# Patient Record
Sex: Male | Born: 1955
Health system: Southern US, Community
[De-identification: ages and names within clinical notes are randomized; demographics above are authoritative.]

## PROBLEM LIST (undated history)

## (undated) DIAGNOSIS — I10 Essential (primary) hypertension: Secondary | ICD-10-CM

## (undated) DIAGNOSIS — K2920 Alcoholic gastritis without bleeding: Secondary | ICD-10-CM

## (undated) DIAGNOSIS — R569 Unspecified convulsions: Secondary | ICD-10-CM

## (undated) DIAGNOSIS — Z8719 Personal history of other diseases of the digestive system: Secondary | ICD-10-CM

## (undated) DIAGNOSIS — J45909 Unspecified asthma, uncomplicated: Secondary | ICD-10-CM

## (undated) DIAGNOSIS — G8929 Other chronic pain: Secondary | ICD-10-CM

## (undated) DIAGNOSIS — F329 Major depressive disorder, single episode, unspecified: Secondary | ICD-10-CM

## (undated) DIAGNOSIS — Z8673 Personal history of transient ischemic attack (TIA), and cerebral infarction without residual deficits: Secondary | ICD-10-CM

## (undated) DIAGNOSIS — M545 Low back pain: Secondary | ICD-10-CM

## (undated) DIAGNOSIS — M1990 Unspecified osteoarthritis, unspecified site: Secondary | ICD-10-CM

## (undated) DIAGNOSIS — K089 Disorder of teeth and supporting structures, unspecified: Secondary | ICD-10-CM

## (undated) DIAGNOSIS — F419 Anxiety disorder, unspecified: Secondary | ICD-10-CM

## (undated) DIAGNOSIS — F1910 Other psychoactive substance abuse, uncomplicated: Secondary | ICD-10-CM

## (undated) DIAGNOSIS — I639 Cerebral infarction, unspecified: Secondary | ICD-10-CM

## (undated) DIAGNOSIS — K4090 Unilateral inguinal hernia, without obstruction or gangrene, not specified as recurrent: Secondary | ICD-10-CM

## (undated) DIAGNOSIS — L0390 Cellulitis, unspecified: Secondary | ICD-10-CM

## (undated) HISTORY — DX: Low back pain: M54.5

## (undated) HISTORY — DX: Major depressive disorder, single episode, unspecified: F32.9

## (undated) HISTORY — DX: Other chronic pain: G89.29

## (undated) HISTORY — DX: Anxiety disorder, unspecified: F41.9

## (undated) HISTORY — DX: Other psychoactive substance abuse, uncomplicated: F19.10

## (undated) HISTORY — DX: Disorder of teeth and supporting structures, unspecified: K08.9

## (undated) HISTORY — PX: EYE SURGERY: SHX253

## (undated) HISTORY — DX: Unspecified convulsions: R56.9

## (undated) HISTORY — PX: HERNIA REPAIR: SHX51

## (undated) HISTORY — DX: Personal history of other diseases of the digestive system: Z87.19

## (undated) HISTORY — DX: Essential (primary) hypertension: I10

## (undated) HISTORY — PX: HAND SURGERY: SHX662

## (undated) HISTORY — PX: ABDOMINAL SURGERY: SHX537

## (undated) HISTORY — DX: Personal history of transient ischemic attack (TIA), and cerebral infarction without residual deficits: Z86.73

## (undated) HISTORY — DX: Alcoholic gastritis without bleeding: K29.20

## (undated) HISTORY — PX: TONSILLECTOMY: SUR1361

---

## 1976-01-30 HISTORY — PX: FRACTURE SURGERY: SHX138

## 2006-01-29 DIAGNOSIS — K2920 Alcoholic gastritis without bleeding: Secondary | ICD-10-CM

## 2006-01-29 HISTORY — DX: Alcoholic gastritis without bleeding: K29.20

## 2006-02-15 ENCOUNTER — Inpatient Hospital Stay (HOSPITAL_COMMUNITY): Admission: EM | Admit: 2006-02-15 | Discharge: 2006-02-19 | Payer: Self-pay | Admitting: Emergency Medicine

## 2006-02-18 ENCOUNTER — Encounter (INDEPENDENT_AMBULATORY_CARE_PROVIDER_SITE_OTHER): Payer: Self-pay | Admitting: *Deleted

## 2006-02-22 ENCOUNTER — Ambulatory Visit: Payer: Self-pay | Admitting: Family Medicine

## 2006-02-25 ENCOUNTER — Ambulatory Visit: Payer: Self-pay | Admitting: Internal Medicine

## 2006-03-11 ENCOUNTER — Ambulatory Visit: Payer: Self-pay | Admitting: Family Medicine

## 2006-04-02 ENCOUNTER — Ambulatory Visit: Payer: Self-pay | Admitting: Family Medicine

## 2006-10-25 ENCOUNTER — Ambulatory Visit: Payer: Self-pay | Admitting: Family Medicine

## 2006-10-25 DIAGNOSIS — F419 Anxiety disorder, unspecified: Secondary | ICD-10-CM | POA: Insufficient documentation

## 2006-10-25 DIAGNOSIS — I10 Essential (primary) hypertension: Secondary | ICD-10-CM

## 2006-10-25 DIAGNOSIS — Z7289 Other problems related to lifestyle: Secondary | ICD-10-CM

## 2006-10-25 DIAGNOSIS — K209 Esophagitis, unspecified without bleeding: Secondary | ICD-10-CM | POA: Insufficient documentation

## 2006-10-25 HISTORY — DX: Essential (primary) hypertension: I10

## 2007-01-08 ENCOUNTER — Inpatient Hospital Stay (HOSPITAL_COMMUNITY): Admission: EM | Admit: 2007-01-08 | Discharge: 2007-01-11 | Payer: Self-pay | Admitting: Emergency Medicine

## 2007-01-08 ENCOUNTER — Ambulatory Visit: Payer: Self-pay | Admitting: Internal Medicine

## 2007-01-08 DIAGNOSIS — R569 Unspecified convulsions: Secondary | ICD-10-CM

## 2007-01-08 HISTORY — DX: Unspecified convulsions: R56.9

## 2007-01-14 ENCOUNTER — Ambulatory Visit: Payer: Self-pay | Admitting: Family Medicine

## 2007-01-14 DIAGNOSIS — F329 Major depressive disorder, single episode, unspecified: Secondary | ICD-10-CM

## 2007-01-14 DIAGNOSIS — F32A Depression, unspecified: Secondary | ICD-10-CM

## 2007-01-14 HISTORY — DX: Depression, unspecified: F32.A

## 2007-01-15 ENCOUNTER — Telehealth (INDEPENDENT_AMBULATORY_CARE_PROVIDER_SITE_OTHER): Payer: Self-pay | Admitting: *Deleted

## 2007-01-16 ENCOUNTER — Telehealth: Payer: Self-pay | Admitting: Family Medicine

## 2007-01-24 ENCOUNTER — Encounter: Payer: Self-pay | Admitting: Family Medicine

## 2007-01-28 LAB — CONVERTED CEMR LAB
BUN: 12 mg/dL (ref 6–23)
Calcium: 9.8 mg/dL (ref 8.4–10.5)
Chloride: 102 meq/L (ref 96–112)
Creatinine, Ser: 1.3 mg/dL (ref 0.4–1.5)
GFR calc Af Amer: 75 mL/min
HCT: 32.1 % — ABNORMAL LOW (ref 39.0–52.0)
Hemoglobin: 11.5 g/dL — ABNORMAL LOW (ref 13.0–17.0)
MCHC: 35.8 g/dL (ref 30.0–36.0)
Potassium: 4.5 meq/L (ref 3.5–5.1)
Sodium: 140 meq/L (ref 135–145)
WBC: 9.5 10*3/uL (ref 4.5–10.5)

## 2007-02-04 ENCOUNTER — Ambulatory Visit: Payer: Self-pay | Admitting: Family Medicine

## 2007-02-28 ENCOUNTER — Telehealth: Payer: Self-pay | Admitting: Family Medicine

## 2007-05-21 ENCOUNTER — Telehealth: Payer: Self-pay | Admitting: Family Medicine

## 2007-05-23 ENCOUNTER — Telehealth: Payer: Self-pay | Admitting: Family Medicine

## 2007-06-18 ENCOUNTER — Telehealth: Payer: Self-pay | Admitting: Family Medicine

## 2007-07-11 ENCOUNTER — Ambulatory Visit: Payer: Self-pay | Admitting: Family Medicine

## 2007-07-11 DIAGNOSIS — R569 Unspecified convulsions: Secondary | ICD-10-CM

## 2007-10-17 ENCOUNTER — Telehealth: Payer: Self-pay | Admitting: Family Medicine

## 2008-01-21 ENCOUNTER — Telehealth: Payer: Self-pay | Admitting: Family Medicine

## 2008-02-03 ENCOUNTER — Telehealth: Payer: Self-pay | Admitting: Family Medicine

## 2008-04-19 ENCOUNTER — Telehealth: Payer: Self-pay | Admitting: Family Medicine

## 2008-05-24 ENCOUNTER — Ambulatory Visit: Payer: Self-pay | Admitting: Family Medicine

## 2008-06-21 ENCOUNTER — Telehealth: Payer: Self-pay | Admitting: Family Medicine

## 2008-08-18 ENCOUNTER — Telehealth: Payer: Self-pay | Admitting: Family Medicine

## 2008-11-10 ENCOUNTER — Encounter (INDEPENDENT_AMBULATORY_CARE_PROVIDER_SITE_OTHER): Payer: Self-pay | Admitting: General Surgery

## 2008-11-10 ENCOUNTER — Inpatient Hospital Stay (HOSPITAL_COMMUNITY): Admission: EM | Admit: 2008-11-10 | Discharge: 2008-11-16 | Payer: Self-pay | Admitting: Emergency Medicine

## 2008-12-27 ENCOUNTER — Telehealth: Payer: Self-pay | Admitting: Family Medicine

## 2009-05-03 ENCOUNTER — Telehealth: Payer: Self-pay | Admitting: Family Medicine

## 2009-09-20 ENCOUNTER — Encounter: Payer: Self-pay | Admitting: Family Medicine

## 2009-10-04 ENCOUNTER — Ambulatory Visit: Payer: Self-pay | Admitting: Family Medicine

## 2010-02-28 NOTE — Progress Notes (Signed)
Summary: refill omeprazole  Phone Note From Pharmacy   Caller: [facility]  [address]  [phone]* Call For: refill  Summary of Call: omeprazole 40 mg  bid Initial call taken by: Duard Brady LPN,  May 03, 2009 9:03 AM  Follow-up for Phone Call        call in #60 with 2 rf Follow-up by: Nelwyn Salisbury MD,  May 03, 2009 12:58 PM    New/Updated Medications: OMEPRAZOLE 40 MG CPDR (OMEPRAZOLE) bid Prescriptions: OMEPRAZOLE 40 MG CPDR (OMEPRAZOLE) bid  #60 x 5   Entered by:   Duard Brady LPN   Authorized by:   Nelwyn Salisbury MD   Signed by:   Duard Brady LPN on 19/14/7829   Method used:   Electronically to        [facility]  [phone]* (retail)       [address]       [place], [place]  [address]       Ph: [phone] or [phone]       Fax: [phone]   RxID:   [id number]  added to med list and efilled. KIK

## 2010-02-28 NOTE — Letter (Signed)
Summary: Medical Review for [facility]  Medical Review for DMV   Imported By: Maryln Gottron 10/11/2009 13:11:14  _____________________________________________________________________  External Attachment:    Type:   Image     Comment:   External Document

## 2010-02-28 NOTE — Assessment & Plan Note (Signed)
Summary: fup meds//ccm   Vital Signs:  Patient profile:   55 year old male Weight:      157 pounds BMI:     23.27 O2 Sat:      95 % Temp:     99 degrees F BP sitting:   170 / 118  (left arm) Cuff size:   large  Vitals Entered By: Pura Spice, RN (October 04, 2009 9:06 AM) CC: ran out meds for 1 month under stress wants papers completed Is Patient Diabetic? No    History of Present Illness: Here for med refills and to fill out a DMV form. We have not seen him for over a year and a half, primarily because he has had no health insurance. he has a new job, but he needs to work 6 months before his insurance is effective. He needs DMV forms filled out due to his hx of alcohol abuse and one withdrawal seizure.  Of course his BP is up since he has been out of meds for several months.   Preventive Screening-Counseling & Management  Alcohol-Tobacco     Smoking Status: current     Packs/Day: 1.0     Year Started: 1972  Allergies (verified): No Known Drug Allergies  Past History:  Past Medical History: Reviewed history from 07/11/2007 and no changes required. Hypertension Anxiety esophagitis alcoholism single alcohol withdrawal seizure on 01-08-07  Social History: Packs/Day:  1.0  Review of Systems  The patient denies anorexia, fever, weight loss, weight gain, vision loss, decreased hearing, hoarseness, chest pain, syncope, dyspnea on exertion, peripheral edema, prolonged cough, headaches, hemoptysis, abdominal pain, melena, hematochezia, severe indigestion/heartburn, hematuria, incontinence, genital sores, muscle weakness, suspicious skin lesions, transient blindness, difficulty walking, depression, unusual weight change, abnormal bleeding, enlarged lymph nodes, angioedema, breast masses, and testicular masses.    Physical Exam  General:  Well-developed,well-nourished,in no acute distress; alert,appropriate and cooperative throughout examination Neck:  No deformities,  masses, or tenderness noted. Lungs:  Normal respiratory effort, chest expands symmetrically. Lungs are clear to auscultation, no crackles or wheezes. Heart:  Normal rate and regular rhythm. S1 and S2 normal without gallop, murmur, click, rub or other extra sounds. Extremities:  no edema    Impression & Recommendations:  Problem # 1:  SEIZURES, HX OF (ICD-V12.49)  Problem # 2:  ABUSE, ALCOHOL, UNSPECIFIED (ICD-305.00)  Problem # 3:  HYPERTENSION (ICD-401.9)  His updated medication list for this problem includes:    Metoprolol Tartrate 100 Mg Tabs (Metoprolol tartrate) .Marland Kitchen... 1 by mouth two times a day    Hydrochlorothiazide 25 Mg Tabs (Hydrochlorothiazide) .Marland Kitchen... Take 1 tab by mouth every morning    Amlodipine Besylate 5 Mg Tabs (Amlodipine besylate) .Marland Kitchen... 1 by mouth every day    Lisinopril 20 Mg Tabs (Lisinopril) .Marland Kitchen... 2  tablet by mouth daily  Complete Medication List: 1)  Metoprolol Tartrate 100 Mg Tabs (Metoprolol tartrate) .Marland Kitchen.. 1 by mouth two times a day 2)  Super B Complex Tabs (B complex-c) .Marland Kitchen.. 1 by mouth once daily 3)  Vitamin B-1 100 Mg Tabs (Thiamine hcl) .Marland Kitchen.. 1 by mouth once daily 4)  Prozac 20 Mg Caps (Fluoxetine hcl) .... Once daily 5)  Lorazepam 0.5 Mg Tabs (Lorazepam) .Marland Kitchen.. 1 by mouth two times a day 6)  Hydrochlorothiazide 25 Mg Tabs (Hydrochlorothiazide) .... Take 1 tab by mouth every morning 7)  Amlodipine Besylate 5 Mg Tabs (Amlodipine besylate) .Marland Kitchen.. 1 by mouth every day 8)  Lisinopril 20 Mg Tabs (Lisinopril) .... 2  tablet by mouth daily 9)  Omeprazole 40 Mg Cpdr (Omeprazole) .... Two times a day  Patient Instructions: 1)  I filled out his DMV forms and refilled his meds. He will set up a cpx soon.  Prescriptions: LORAZEPAM 0.5 MG  TABS (LORAZEPAM) 1 by mouth two times a day  #60 x 5   Entered and Authorized by:   Nelwyn Salisbury MD   Signed by:   Nelwyn Salisbury MD on 10/04/2009   Method used:   Print then Give to Patient   RxID:   [id number] OMEPRAZOLE 40  MG CPDR (OMEPRAZOLE) two times a day  #60 x 11   Entered and Authorized by:   Nelwyn Salisbury MD   Signed by:   Nelwyn Salisbury MD on 10/04/2009   Method used:   Electronically to        [facility]  [phone]* (retail)       [address]       [place], [place]  [address]       Ph: [phone] or [phone]       Fax: [phone]   RxID:   [id number] LISINOPRIL 20 MG TABS (LISINOPRIL) 2  tablet by mouth daily  #60 x 11   Entered and Authorized by:   Nelwyn Salisbury MD   Signed by:   Nelwyn Salisbury MD on 10/04/2009   Method used:   Electronically to        [facility]  [phone]* (retail)       [address]       [place], [place]  [address]       Ph: [phone] or [phone]       Fax: [phone]   RxID:   [id number] AMLODIPINE BESYLATE 5 MG  TABS (AMLODIPINE BESYLATE) 1 by mouth every day  #30 x 11   Entered and Authorized by:   Nelwyn Salisbury MD   Signed by:   Nelwyn Salisbury MD on 10/04/2009   Method used:   Electronically to        [facility]  [phone]* (retail)       [address]       [place], [place]  [address]       Ph: [phone] or [phone]       Fax: [phone]   RxID:   [id number] HYDROCHLOROTHIAZIDE 25 MG  TABS (HYDROCHLOROTHIAZIDE) Take 1 tab by mouth every morning  #30 x 11   Entered and Authorized by:   Nelwyn Salisbury MD   Signed by:   Nelwyn Salisbury MD on 10/04/2009   Method used:   Electronically to        [facility]  [phone]* (retail)       [address]       [place], [place]  [address]       Ph: [phone] or [phone]       Fax: [phone]   RxID:   [id number] PROZAC 20 MG  CAPS (FLUOXETINE HCL) once daily  #30 x 11   Entered and Authorized by:   Nelwyn Salisbury MD   Signed by:   Nelwyn Salisbury MD on 10/04/2009   Method used:   Electronically to  [facility]  [address]  [phone]*  (retail)       [address]       [place], [place]  [address]       Ph: [phone] or [phone]       Fax: [phone]   RxID:   [id number] METOPROLOL TARTRATE 100 MG  TABS (METOPROLOL TARTRATE) 1 by mouth two times a day  #60 x 11   Entered and Authorized by:   Nelwyn Salisbury MD   Signed by:   Nelwyn Salisbury MD on 10/04/2009   Method used:   Electronically to        [facility]  [phone]* (retail)       [address]       [place], [place]  [address]       Ph: [phone] or [phone]       Fax: [phone]   RxID:   [id number]

## 2010-04-11 ENCOUNTER — Other Ambulatory Visit: Payer: Self-pay | Admitting: Family Medicine

## 2010-04-26 ENCOUNTER — Other Ambulatory Visit: Payer: Self-pay | Admitting: Family Medicine

## 2010-04-27 NOTE — Telephone Encounter (Signed)
NEEDS REFILLS  LORAZEPAM

## 2010-04-27 NOTE — Telephone Encounter (Signed)
Called rx in for lorazepam

## 2010-04-27 NOTE — Telephone Encounter (Signed)
Call in #60 with 5 rf 

## 2010-05-04 LAB — COMPREHENSIVE METABOLIC PANEL
ALT: 16 U/L (ref 0–53)
AST: 43 U/L — ABNORMAL HIGH (ref 0–37)
Albumin: 2.1 g/dL — ABNORMAL LOW (ref 3.5–5.2)
Albumin: 3.2 g/dL — ABNORMAL LOW (ref 3.5–5.2)
Alkaline Phosphatase: 44 U/L (ref 39–117)
Alkaline Phosphatase: 67 U/L (ref 39–117)
CO2: 28 mEq/L (ref 19–32)
CO2: 28 mEq/L (ref 19–32)
Calcium: 7.2 mg/dL — ABNORMAL LOW (ref 8.4–10.5)
Chloride: 93 mEq/L — ABNORMAL LOW (ref 96–112)
GFR calc Af Amer: 53 mL/min — ABNORMAL LOW (ref >60)
GFR calc non Af Amer: 44 mL/min — ABNORMAL LOW (ref >60)
GFR calc non Af Amer: >60 mL/min (ref >60)
Glucose, Bld: 189 mg/dL — ABNORMAL HIGH (ref 70–99)
Potassium: 3.3 mEq/L — ABNORMAL LOW (ref 3.5–5.1)
Potassium: 3.8 mEq/L (ref 3.5–5.1)
Sodium: 133 mEq/L — ABNORMAL LOW (ref 135–145)
Total Bilirubin: 0.6 mg/dL (ref 0.3–1.2)

## 2010-05-04 LAB — CBC
HCT: 43.6 % (ref 39.0–52.0)
HCT: 51.9 % (ref 39.0–52.0)
Hemoglobin: 13.4 g/dL (ref 13.0–17.0)
Hemoglobin: 15.4 g/dL (ref 13.0–17.0)
Hemoglobin: 18.3 g/dL — ABNORMAL HIGH (ref 13.0–17.0)
MCHC: 35.1 g/dL (ref 30.0–36.0)
MCHC: 35.4 g/dL (ref 30.0–36.0)
MCV: 103.7 fL — ABNORMAL HIGH (ref 78.0–100.0)
MCV: 104.6 fL — ABNORMAL HIGH (ref 78.0–100.0)
MCV: 105 fL — ABNORMAL HIGH (ref 78.0–100.0)
Platelets: 246 10*3/uL (ref 150–400)
Platelets: 249 10*3/uL (ref 150–400)
Platelets: 303 10*3/uL (ref 150–400)
Platelets: 387 10*3/uL (ref 150–400)
RBC: 3.66 MIL/uL — ABNORMAL LOW (ref 4.22–5.81)
RBC: 4.16 MIL/uL — ABNORMAL LOW (ref 4.22–5.81)
RDW: 13.5 % (ref 11.5–15.5)
RDW: 14.1 % (ref 11.5–15.5)
WBC: 14.6 10*3/uL — ABNORMAL HIGH (ref 4.0–10.5)
WBC: 5.3 10*3/uL (ref 4.0–10.5)

## 2010-05-04 LAB — POCT I-STAT 3, ART BLOOD GAS (G3+)
Bicarbonate: 27.6 mEq/L — ABNORMAL HIGH (ref 20.0–24.0)
Patient temperature: 98.6
pCO2 arterial: 45.9 mmHg — ABNORMAL HIGH (ref 35.0–45.0)
pO2, Arterial: 83 mmHg (ref 80.0–100.0)

## 2010-05-04 LAB — DIFFERENTIAL
Neutro Abs: 13 10*3/uL — ABNORMAL HIGH (ref 1.7–7.7)
Neutrophils Relative %: 89 % — ABNORMAL HIGH (ref 43–77)

## 2010-05-04 LAB — PROTIME-INR
INR: 1.18 (ref 0.00–1.49)
Prothrombin Time: 14.9 seconds (ref 11.6–15.2)

## 2010-05-04 LAB — BASIC METABOLIC PANEL
BUN: 9 mg/dL (ref 6–23)
CO2: 25 mEq/L (ref 19–32)
CO2: 28 mEq/L (ref 19–32)
Chloride: 92 mEq/L — ABNORMAL LOW (ref 96–112)
Chloride: 94 mEq/L — ABNORMAL LOW (ref 96–112)
Creatinine, Ser: 0.86 mg/dL (ref 0.4–1.5)
Creatinine, Ser: 2.06 mg/dL — ABNORMAL HIGH (ref 0.4–1.5)
GFR calc Af Amer: 41 mL/min — ABNORMAL LOW (ref >60)
Glucose, Bld: 113 mg/dL — ABNORMAL HIGH (ref 70–99)
Sodium: 130 mEq/L — ABNORMAL LOW (ref 135–145)

## 2010-05-04 LAB — CREATININE, SERUM: Creatinine, Ser: 1.49 mg/dL (ref 0.4–1.5)

## 2010-06-13 NOTE — H&P (Signed)
NAMEEDMUND, Jared Reyes               ACCOUNT NO.:  [id number]   MEDICAL RECORD NO.:  [id number]          PATIENT TYPE:  EMS   LOCATION:  ED                           FACILITY:  [facility]   PHYSICIAN:  Hollice Espy, Jared ReyesDATE OF BIRTH:  February 24, 1955   DATE OF ADMISSION:  01/07/2007  DATE OF DISCHARGE:                              HISTORY & PHYSICAL   CHIEF COMPLAINT:  Confusion.   HISTORY OF PRESENT ILLNESS:  The patient is a 55 year old white male  with past medical history of hypertension and found to be heavy alcohol  use.  Initially the story is quite convoluted and according to the ER  attending, initially the patient was thought to have fallen or to have  had a brief syncopal episode at home.  But then according to the family  the story changed when they said that they found him outside.  It was  unclear how he got from inside to outside.  He was brought into the  emergency room for further evaluation.  In the emergency room, the  patient's blood pressure on admission was 96/61 with a heart rate of  115.  He was also noted to have a temperature of 101.1.  Labs were  ordered on the patient and he was found to have a normal white count,  but noted to have an MCV of 101, potassium 2.5, BUN 4, creatinine 1.63,  and elevated transaminases of AST 171, ALT 57, and alkaline phosphatase  226, and total bilirubin of 2.7.  His CT of his head was unremarkable,  but then during the ER workup, the patient had a sudden onset seizure.  It was after discussion with the family that they found that the patient  had a previous history of quite heavy alcohol abuse, but he says he has  not been drinking for a while, but he had one beer a few days ago.  It  was suspected that the patient was down playing his alcohol use as of  late and likely had an alcohol withdrawal seizure.  He was given doses  of Ativan of which he is currently sedated now and unable to give me any  kind of review of systems or  history which is obtained from the family  and from the ER attending.  The patient apparently appears to be calm.   PAST MEDICAL HISTORY:  Alcohol abuse and hypertension, as well as  anxiety.   MEDICATIONS:  1. Xanax 0.5 mg p.o. b.i.d.  2. Hydrochlorothiazide 25 mg daily.  3. Metoprolol 100 mg p.o. daily.  4. Vytorin 10/40 mg p.o. daily.   ALLERGIES:  No known drug allergies.   SOCIAL HISTORY:  He does smoke heavily.  He drinks heavy alcohol, both  beer and liquor.  No reported drug use.   FAMILY HISTORY:  Noncontributory.   PHYSICAL EXAMINATION:  VITAL SIGNS:  Temperature 97.8, heart rate 115,  blood pressure 96/61 now up to 116/52, respirations 20, O2 saturations  97% on room air.  He has a TMAX of 101.1.  GENERAL:  He is currently sleeping from sedation.  HEENT:  Normocephalic and atraumatic.  His mucous membranes are dry.  He  has no carotid bruits.  HEART:  Regular rate and rhythm, S1 and S2.  2/6 systolic ejection  murmur.  LUNGS:  Decreased breath sounds throughout.  ABDOMEN:  Soft, nontender, and nondistended.  Decreased bowel sounds.  EXTREMITIES:  No cyanosis, clubbing, or edema.   LABORATORY DATA:  White count 7.9, H&H 10.8 and 29, MCV 101, platelet  count 224.  Sodium 133, potassium 2.5, chloride 94, bicarb 24, BUN 4,  creatinine 1.6, glucose 132.  Urine is unremarkable.  LFT's are notable  for albumin 3.3, AST 171, ALT 57, alkaline phosphatase 226, total  bilirubin 2.7, direct bilirubin 1.2.   ASSESSMENT:  1. Alcohol withdrawal.  We will place on IV Ativan protocol.  2. Alcohol abuse.  Provide counseling.  3. Fever with possible signs of infection, although this may be      secondary to alcohol hepatitis or withdrawal.  We will continue to      monitor.  4. Hypokalemia.  We will replace.  5. Tobacco abuse.      Hollice Espy, M.D.  Electronically Signed     SKK/MEDQ  D:  01/08/2007  T:  01/08/2007  Job:  [id number]   cc:   Jeannett Senior A. Clent Ridges, MD  [address] [place]  [place] [address]

## 2010-06-13 NOTE — Discharge Summary (Signed)
NAMECORNELIOUS, Jared Reyes               ACCOUNT NO.:  [id number]   MEDICAL RECORD NO.:  [id number]          PATIENT TYPE:  INP   LOCATION:  1503                         FACILITY:  [facility]   PHYSICIAN:  Georgina Quint. Plotnikov, MDDATE OF BIRTH:  29-Apr-1955   DATE OF ADMISSION:  01/08/2007  DATE OF DISCHARGE:  01/11/2007                               DISCHARGE SUMMARY   DISCHARGE DIAGNOSES:  1. Alcohol withdrawal.  2. Alcohol withdrawal seizure.  3. Alcohol withdrawal agitation and confusion.  4. Fever, resolved.  5. Anemia likely related to alcohol use.  6. Tobacco abuse.  7. Hypertension.  8. History of dyslipidemia.   HISTORY:  The patient is 55 year old male who was admitted to the  hospital on January 07, 2007 by Dr. Rito Ehrlich, with confusion, agitation  and fever.  For the details, please address the history and physical.  During the course of hospitalization, the patient was treated for  agitation/alcohol withdrawal.  He also had agitation seizure.  He was  found to be anemic.  His liver tests were elevated.  The fever has  resolved, without antibiotics.   EXAMINATION:  VITAL SIGNS:  On the day of discharge, she is feeling  well.  Blood pressure 143/90, heart rate 96, respirations 20,  temperature 97.6, sats 99% on room air, potassium 3.5, sodium 136,  calcium 6.5, hemoglobin 9.4, MCV 103.2.  GENERAL:  He is in no acute distress, alert, oriented, cooperative.  HEENT:  Pupils reactive. HEENT with moist mucosa.  LUNGS:  Clear.  HEART:  Regular.  ABDOMEN:  Soft, nontender.  LOWER EXTREMITIES: Without edema.  Slight tremor in extended  extremities.  SKIN:  Clear.   LABS:  Potassium 3.5, sodium 136, calcium 6.5, hemoglobin 9.4, MCV  103.2.  Lab work in the past previous days revealed that his potassium  was 2.5 on admission, elevated alk phos at 188 and 226, later 163.  AST  171 - 65, ALT 57 - 38, magnesium 1.6.  His ferritin was 47 and 58,  folate 397, vitamin B12 685, iron 139,  cholesterol 161.  Urinalysis  normal.  Original RBC count was 10. Original hemoglobin was 7.8.  His  MRI of the brain showed a lot of artifact, no evidence of acute  thrombotic infarct.  Chest x-ray revealed no acute disease.  EKG  revealed sinus tachycardia with PACs.   FOLLOWUP PLANS:  1. Dr. Clent Ridges next week with CBC and a BMET.  2. He will join alcoholic anonymous.  3. He is advised not to smoke.   DISCHARGE MEDICATIONS:  1. Lorazepam 0.5 mg twice a day until he sees Dr. Clent Ridges, then as      directed by him.  2. Ranitidine 150 mg twice a day.  3. Thiamine 100 mg once daily.  4. Resume other home meds.  5. Vitamin B complex one daily.      Georgina Quint. Plotnikov, MD  Electronically Signed     AVP/MEDQ  D:  01/11/2007  T:  01/12/2007  Job:  [id number]

## 2010-06-16 NOTE — Assessment & Plan Note (Signed)
[facility]                            [facility] OFFICE NOTE   NAME:Jared Reyes                         MRN:          [id number]  DATE:02/22/2006                            DOB:          [birth year]    This is a 55 year old gentleman here to establish with our practice, and  who is for followup hypertension, upper GI bleeding, and alcohol  withdrawal.  He had not seen a regular physician in quite a while, but  then presented to [facility] emergency room on January 18th with  evidence of acute alcoholic withdrawal, of a hypertension crisis, of  elevated liver enzymes, and of anemia secondary to an acute GI bleed.  Workup included an upper endoscopy done on January 21st by Dr. Lina Sar.  This demonstrated duodenitis, duodenal ulcers, and gastritis  felt secondary to excessive alcohol use.  He was transfused 3 units of  blood which normalized his anemia.  He was treated for acute alcohol  withdrawal with benzodiazepines.  He was also hydrated and his elevated  blood pressures were treated.  Apparently, he did fairly well in the  hospital.  He was discharged on January 22nd on the medications below.  In general today he feels a lot better than he did, although he is still  somewhat weak.  He gets a bit shaky.  He is proud to say he has used no  alcohol whatsoever since he was admitted to the hospital, and says he is  determined to stay alcohol free for the rest of his life.  He does plan  to become involved with Alcoholics Anonymous, and says he is going to a  meeting in the next several days.  He was given Ativan for withdrawal  symptoms.  He says it works fairly well, but it seems to run out and he  feels shaky after several hours.  His stomach feels fine with no nausea  and no pain.  He has not checked his blood pressure since he got out of  the hospital.   Other past medical history includes surgery to repair an inguinal  hernia, surgery to a  hand for a broken bone, a tonsillectomy, and  treatment for depression.   ALLERGIES:  None.   CURRENT MEDICATIONS:  1. Norvasc 10 mg per day.  2. Metoprolol tartrate 25 mg once a day.  3. Lorazepam 1 mg 3 times.  4. Lisinopril 3 10 mg b.i.d.  5. Protonix 40 mg once a day.  6. Sucralfate 1 g t.i.d.   HABITS:  A long history of excessive alcohol abuse, but he has had  nothing to drink now for 1 week.  He continues to smoke cigarettes, but  has decreased his daily consumption from 2 packs a day to 1 pack a day.  He denies using illicit drugs.   SOCIAL HISTORY:  He is divorced.  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   FAMILY HISTORY:  Remarkable for hypertension and diabetes.   OBJECTIVE:  Height 5 foot 9 inches.  Weight 162.  BP 172/110.  Pulse 72  and regular.  GENERAL:  He appears to be doing well.  He is quite thin.  Affect is  bright and he is appropriate.  NECK:  Supple without lymphadenopathy or masses.  LUNGS:  Show soft diffuse rhonchi and diffuse wheezes.  CARDIAC:  Rate and rhythm are regular without gallops, murmurs, rubs.  Distal pulses are full.   EKG today is within normal limits.   EXTREMITIES: Show no edema.   We did review some of his laboratories in the hospital.  His lowest  hemoglobin on January 20th was 7.5.  This was up to 11.6 the day he was  discharged.  Other labs are remarkable for normal Protime and PTT.  Normal electrolytes except for transiently low potassium.  Liver enzymes  normalized by the time he was discharged.  Renal function was good.   ASSESSMENT/PLAN:  1. Poorly controlled hypertension.  Will stop Norvasc, stop lisinopril      and stop metoprolol tartrate.  Instead we will begin Azor 10/40      once a day, Toprol XL 100 mg once a day, and hydrochlorothiazide 25      mg once a day.  Ask him to see me again in the clinic in 2 weeks      for followup.  2. Status post upper gastrointestinal bleed.   He will continue      Protonix and sucralfate for the time being.  3. Alcohol abuse.  I congratulated him for staying sober over the past      week, and strongly encouraged him to remain so.  Instead of Ativan,      we will begin Xanax 0.5 mg b.i.d. to give him smoother coverage.  I      wrote for #60 with 2 refills.  We will follow all of this up in 2      weeks as well.  At that time, we will probably get some more lab      work also.     Tera Mater. Clent Ridges, MD  Electronically Signed    SAF/MedQ  DD: 02/23/2006  DT: 02/23/2006  Job #: [id number]

## 2010-06-16 NOTE — Consult Note (Signed)
NAMENAYSHAWN, MESTA                ACCOUNT NO.:  [id number]   MEDICAL RECORD NO.:  [id number]          PATIENT TYPE:  INP   LOCATION:  1424                         FACILITY:  [facility]   PHYSICIAN:  Antonietta Breach, M.D.  DATE OF BIRTH:  [birth year]   DATE OF CONSULTATION:  02/19/2006  DATE OF DISCHARGE:  02/19/2006                                 CONSULTATION   Requesting physician is Dr. Hannah Beat.   REASON FOR CONSULTATION:  Depressed mood and alcohol dependence.   HISTORY OF PRESENT ILLNESS:  Mr. Cherubini is a 55 year old male admitted  to the [facility] on February 15, 2006 due to nausea and vomiting with  dizziness and shortness of breath.   Mr. Wilbert has been drinking more than 8 beers per day for several  months.  He tried to quit abruptly and began having nausea, vomiting,  tremulousness and hypertension with a blood pressure of 100/110 in the  emergency room.   He has had some mild depressed mood and decreased energy as well as  concentration which have improved.  He has normal interests now.  His  energy has recovered to about 85%.  He has no thoughts of harming  himself.  No thoughts of harming others.  He has no hallucinations or  delusions.  He is tolerating the Ativan withdrawal protocol well without  adverse effects and he is no longer having alcohol withdrawal signs or  symptoms.   PAST PSYCHIATRIC HISTORY:  He has no history of primary psychiatric  disorders other than alcohol related withdrawal.  He has never been in  an alcohol rehabilitation program.  He has no known history of  hallucinations or delusions.   FAMILY PSYCHIATRIC HISTORY:  None known.   SOCIAL HISTORY:  Marital status divorced.  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  He has two children ages 69 and 12.  He does not use any  illegal drugs.  He does smoke cigarettes.  He does have a supportive  group of friends as  well as his sister who lives nearby.  His mother is  still alive at the age of 67.   GENERAL MEDICAL PROBLEMS:  1. Hypertension.  2. Upper gastrointestinal bleeding.  3. Alcoholic gastritis.  4. Mild elevation of liver function tests.   No drug allergies.   MEDICATIONS:  MAR is reviewed.  The patient is finishing up the Ativan  withdrawal protocol and he continues his thiamine 100 mg daily and a  multivitamin daily.   LABORATORY DATA:  WBC 5.3, hemoglobin 11.6, platelet count 180.  Metabolic panel:  BUN is 3, creatinine 0.54, calcium 8.1.  His SGOT on  February 18, 2006 was 63 and SGPT was 67, albumin was 3 with magnesium at  1.4.   REVIEW OF SYSTEMS:  CONSTITUTIONAL:  Afebrile.  HEAD:  No trauma.  EYES:  No visual changes.  EARS:  No hearing impairment.  NOSE:  No rhinorrhea.  MOUTH/THROAT:  No sore throat.  NEUROLOGIC:  Unremarkable.  PSYCHIATRIC:  As above.  CARDIOVASCULAR:  No chest pain, palpitations or edema.  RESPIRATORY:  No coughing or wheezing.  GASTROINTESTINAL:  No nausea,  vomiting, diarrhea.  GENITOURINARY:  No dysuria.  SKIN:  Unremarkable.  ENDOCRINE/METABOLIC:  As above.  MUSCULOSKELETAL:  No deformities,  weaknesses or atrophy.  HEMATOLOGIC/LYMPHATIC:  Unremarkable.   EXAMINATION:  VITAL SIGNS:  Temperature 97.5, pulse 83, respirations 18,  blood pressure 156/112 (the patient does have essential hypertension).   MENTAL STATUS EXAM:  Mr. Frappier is a middle-aged male appearing his  chronologic age, partially reclined in a supine position in his hospital  bed.  He is polite well-groomed and socially appropriate.  He is alert  with good eye contact.  His speech involves normal rate and prosody.  He  is oriented completely to all spheres.  His memory is intact to  immediate, recent and remote.  Fund of knowledge and intelligence are  within normal limits.  Thought process:  Logical, coherent, goal-  directed.  No looseness of associations.  Thought content:  No  thoughts  of harming himself.  No thoughts of harming others, no delusions, no  hallucinations.  His concentration is within normal limits.  His insight  is good.  His judgment is intact.  His psychomotor tone is within normal  limits without any withdrawal tremor.   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 83, depressed now      resolved (the patient had a number of general medical factors that      have improved as well as some reactive symptoms).  2. Alcohol dependence.  AXIS II:  Deferred.  AXIS III:  See general medical problems.  AXIS IV:  Occupational, general medical.  AXIS V:  55.   Mr. Peral is not at risk to harm himself or others.  He agrees to call  emergency services immediately for any emergency psychiatric symptoms.   The undersigned provided ego supportive therapy as well as education on  alcohol rehabilitation.  The patient understands above information and  declines any form of formal alcohol rehabilitation and would like to  pursue alcoholics anonymous.   RECOMMENDATIONS:  1. Finish the Ativan taper.  2. Would continue vitamins including B1 100 mg daily and multivitamin      daily and folic acid 1 mg daily indefinitely.  3. No other psychotropic medication indicated.  4. The patient states that he is motivated for alcoholics anonymous      and will be entering meetings.  He does decline CDIOP or      residential CD rehab as described above.  However, he does agree to      call the hospital if he has difficulty maintaining sobriety.      Antonietta Breach, M.D.  Electronically Signed     JW/MEDQ  D:  03/31/2006  T:  03/31/2006  Job:  [id number]

## 2010-06-16 NOTE — H&P (Signed)
NAMECHARON, Jared Reyes                ACCOUNT NO.:  [id number]   MEDICAL RECORD NO.:  [id number]          PATIENT TYPE:  EMS   LOCATION:  ED                           FACILITY:  [facility]   PHYSICIAN:  Hettie Holstein, D.O.    DATE OF BIRTH:  1956-01-10   DATE OF ADMISSION:  02/15/2006  DATE OF DISCHARGE:                              HISTORY & PHYSICAL   PRIMARY CARE PHYSICIAN:  Unassigned.   PRINCIPLE COMPLAINT:  Nausea and vomiting followed by dizziness and  shortness of breath.   HISTORY OF PRESENT ILLNESS:  Jared Reyes is a 55 year old male who has a  longstanding history of alcoholism, though has never sought help in the  past, never been in a detox program.  In any event, he has about a 8+  per day beer-drinking habit of almost 20 years.  In any event, he stated  that he has been trying to quit abruptly and again feeling quite dizzy.  He presented to the emergency department tremulous and initial blood  pressure 180/110.  He typically follows at an urgent care center where  he has been on antihypertensive medication that he does not know the  name.   PAST MEDICAL HISTORY:  1. Hypertension.  2. Hernia surgery.  3. Left hand surgery.   MEDICATIONS:  He is not sure what he takes.  He goes to [employer] [facility] on  [address].   ALLERGIES:  No known drug allergies.   SOCIAL HISTORY:  Currently he is divorced and separated from his wife.  He has two children, age 19 and 6.  He smokes about a pack of  cigarettes a day.  He drinks beer, typically eight a day, sometimes  more.  His sister is present with him and reachable at [phone] or cell  phone [phone].   EMPLOYMENT HISTORY:  Jared Reyes formerly worked for [employer] several weeks ago, although reports that he lost his job  with recent cuts that have been taking place.   FAMILY HISTORY:  Mother is alive at 70.  Father died at age 34 of no  known chronic medical illnesses prior to the age of 28.   REVIEW OF  SYSTEMS:  He reports some dark-colored stool but has been  taking Pepto-Bismol.  His ex-wife is an Char[employer] fundraiser.  In any event, he has had no  chest pain.  He does report some transient shortness of breath that has  resolved at this time.  Otherwise, no blood in his urine or gross blood  in his stool.  No abdominal pain or swelling of his lower extremities.  His weight and appetite have been stable.   PHYSICAL EXAMINATION:  VITAL SIGNS:  In the emergency department, he was  noted to have a blood pressure of 103/70, heart rate 103, respirations  20, O2 saturation 98% on room air.  GENERAL:  He is alert, although he is tremulous.  HEENT:  Head is normocephalic and atraumatic.  Extraocular muscles are  intact.  NECK:  Supple and nontender.  No palpable thyromegaly or mass.  CARDIOVASCULAR:  Normal S1  and S2.  LUNGS:  Clear to auscultation bilaterally.  ABDOMEN:  Soft.  Nontender.  No palpable hepatosplenomegaly.  No rebound  or guarding.  EXTREMITIES:  No edema.  There is no calf tenderness.   LABORATORY DATA:  Chest x-ray pending at this time.   EKG will be ordered.  It was not done in the emergency department.   WBC was 16, hemoglobin 12.3, platelet count 366, MCV 102.  Sodium 135,  potassium 3.9, BUN 49, creatinine 1.19, platelet count 102.   ASSESSMENT:  1. Alcohol withdrawal.  2. Hypertensive urgency, felt to be secondary to #1.  3. Elevated BUN.  Will check stools.  4. Megaloblastic anemia.  5. Tobacco abuse.   PLAN:  At this time, Jared Reyes is at high risk for DT's, as he is  exhibiting evidence of tremulousness.  At this time, we will admit him  for observation and detox and consult appropriate services to find  assistance for him.  Will administer benzodiazepines for withdrawal  protocol.      Hettie Holstein, D.O.  Electronically Signed     ESS/MEDQ  D:  02/15/2006  T:  02/15/2006  Job:  [id number]

## 2010-06-16 NOTE — Discharge Summary (Signed)
[name], Jared Reyes                ACCOUNT NO.:  [id number]   MEDICAL RECORD NO.:  [id number]          PATIENT TYPE:  INP   LOCATION:  1424                         FACILITY:  [facility]   PHYSICIAN:  Hind I Elsaid, MD      DATE OF BIRTH:  [birth year]   DATE OF ADMISSION:  02/15/2006  DATE OF DISCHARGE:  02/19/2006                               DISCHARGE SUMMARY   DISCHARGE MEDICATION:  1. Lisinopril 10 mg p.o. b.i.d.  2. Metoprolol 12.5 mg p.o. q. 12 hours.  3. Norvasc 10 mg p.o. daily.  4. Ativan 1 mg p.o. q. 12 hours for 1 day and then Ativan 1 mg p.o.      daily for 3 days, and then stop for alcoholic withdrawal.  5. Carafate 1 g p.o. t.i.d. for 10 days.  6. Protonix 40 mg p.o. b.i.d. for 1 month.  7. Folate 1 mg p.o. daily.  8. Thiamine.  9. Vitamin B 100 mg p.o. daily.  10.Multivitamin 1 tablet p.o. daily.   HOSPITAL COURSE:  The patient seen by Dr. Antonietta Breach for alcoholic  intoxication and alcohol withdrawal.  The patient to finish tapered  Ativan and to continue with multivitamin.  No other psychotropic  medication needed.  The patient is interested in outpatient alcoholic  group and told the patient I agree with outpatient alcohol group and the  patient can go to the hospital if having some kind of an anxiety or any  kind of emergency.  Gastroenterology also saw the patient after he  developed upper GI bleeding and is status post 3 units of blood  transfusion.  Hemoglobin continued to be stable and all of his vital  signs continued stable.  They recommended to advance diet as tolerated  and to continue with Protonix twice a day for 1 month and to stop  completely alcohol and Carafate 1 g t.i.d. for 10 days and they will  follow up with him as outpatient.  So, we think the patient is medically  stable to be discharged and to follow with primary care physician for  further adjustment of the blood pressure and for the duodenal ulcerative  gastritis to continue the above  medication and to follow with them if  needed as outpatient with gastroenterology.  For alcohol withdrawal, the  patient to be evaluated for outpatient rehab and to call psych if there  are any symptoms of anxiety or any emergency happens to him.      Hind Bosie Helper, MD  Electronically Signed     HIE/MEDQ  D:  02/19/2006  T:  02/19/2006  Job:  [id number]

## 2010-06-16 NOTE — Discharge Summary (Signed)
Jared Reyes, Jared Reyes                ACCOUNT NO.:  [id number]   MEDICAL RECORD NO.:  [id number]          PATIENT TYPE:  INP   LOCATION:  1424                         FACILITY:  [facility]   PHYSICIAN:  Hind I Elsaid, MD      DATE OF BIRTH:  1955/07/08   DATE OF ADMISSION:  02/15/2006  DATE OF DISCHARGE:                               DISCHARGE SUMMARY   DISCHARGE DIAGNOSES:  1. Upper gastrointestinal bleeding status post      esophagogastroduodenoscopy, which showed grade 2 esophagitis      secondary to vomiting, alcoholic gastritis, duodenitis status post      biopsy.  2. Hypertension.  3. Alcoholic withdrawal.  4. Mild elevation of liver function tests.  5. Tobacco abuse.   CONSULTATION:  GI was consulted for GI bleeding.   PROCEDURE:  Endoscopy, EGD was done on January 21, with a result grade 2  esophagitis secondary to vomiting, alcoholic gastritis, duodenitis and  ulcers. Chest x-ray on February 15, 2006.   BRIEF HISTORY:  [age]-year-old male with longstanding history of  alcoholism. He was admitted as he was trying to quit abruptly and again  feeling quite dizzy. He presented to the emergency room tremulous and  with high blood pressure of 180/110. He had hypertensive medication  which he cannot remember at this time. Mainly admitted for alcoholic  withdrawal and hypertensive urgency.   Problem #1, alcoholic withdrawal with some DPS. Patient was seen during  hospitalization was shaking with high blood pressure and mild  tachycardia. It was thought it was due to delirium tremens. The patient  was started on alcoholic withdrawal protocol with Ativan p.r.n. and  folic acid and thiamine to help with alcohol withdrawal. Social worker  and psych were consulted. Blood pressure continued to be high with  response adding antihypertensive medications.   Problem #2. During hospitalization, patient admitted he had a history of  dark stools times 2. We continued checking his hemoglobin,  where it  dropped to the point of he needed three units of blood transfusion. GI  was consulted, where they recommended the patient undergo EGD endoscopy.  The patient underwent EGD endoscopy yesterday with the result of  duodenitis, esophagitis and gastritis in addition to duodenal ulcer. The  patient was started on Protonix 40 mg p.o. b.i.d. and Carafate 1 gram  p.o. q.i.d. and GI following the patient for further recommendation. The  patient started on diet, advanced the diet. We continued checking his  hemoglobin H&H q.12h. for further assessment of upper GI bleeding.   Problem #3, hypertension. Blood pressure continued to be high. Could be  due to delirium tremens or due to the history of high blood pressure.  Will start the patient on Norvasc 10 mg p.o. daily and metoprolol 12.5  mg p.o. b.i.d. Will continue checking further recommendations to be  added.   #4, mild elevation of LFTs was thought to be due to alcoholism and we  continue to monitor his LFTs.   DISPOSITION:  The patient to be discharged home when medically stable  with Carafate and Protonix and further recommendation as  per  gastroenterology. The patient may be discharged home with alcoholic  rehab as outpatient.      Hind Bosie Helper, MD  Electronically Signed     HIE/MEDQ  D:  02/19/2006  T:  02/19/2006  Job:  [id number]

## 2010-10-20 ENCOUNTER — Other Ambulatory Visit: Payer: Self-pay | Admitting: Family Medicine

## 2010-10-20 NOTE — Telephone Encounter (Signed)
Scripts sent e-scribe 

## 2010-10-30 ENCOUNTER — Other Ambulatory Visit: Payer: Self-pay | Admitting: Family Medicine

## 2010-11-01 ENCOUNTER — Telehealth: Payer: Self-pay | Admitting: Family Medicine

## 2010-11-01 NOTE — Telephone Encounter (Signed)
Refill request for Prozac 20 mg take 1 po qd. Pt last here on 10/04/09 and script filled on 09/28/10.

## 2010-11-02 MED ORDER — FLUOXETINE HCL 20 MG PO TABS: 20.0000 mg | ORAL_TABLET | Freq: Every day | ORAL | Status: AC

## 2010-11-02 NOTE — Telephone Encounter (Signed)
rx has been called into the pharmacy

## 2010-11-02 NOTE — Telephone Encounter (Signed)
Call in #30 with no refills. He needs an OV for any more

## 2010-11-03 NOTE — Telephone Encounter (Signed)
done

## 2010-11-06 LAB — CBC
HCT: 25.3 — ABNORMAL LOW
HCT: 26.3 — ABNORMAL LOW
Hemoglobin: 9.1 — ABNORMAL LOW
MCHC: 36.8 — ABNORMAL HIGH
MCV: 103.2 — ABNORMAL HIGH
Platelets: 232
RBC: 2.48 — ABNORMAL LOW
RBC: 2.55 — ABNORMAL LOW
RBC: 2.9 — ABNORMAL LOW
RDW: 15.6 — ABNORMAL HIGH
WBC: 5
WBC: 7
WBC: 7.9

## 2010-11-06 LAB — COMPREHENSIVE METABOLIC PANEL
ALT: 43
AST: 65 — ABNORMAL HIGH
Albumin: 3.3 — ABNORMAL LOW
Alkaline Phosphatase: 163 — ABNORMAL HIGH
Alkaline Phosphatase: 188 — ABNORMAL HIGH
BUN: 3 — ABNORMAL LOW
CO2: 20
CO2: 23
Chloride: 105
Chloride: 109
Creatinine, Ser: 0.9
GFR calc Af Amer: >60
GFR calc non Af Amer: >60
Glucose, Bld: 89
Potassium: 3 — ABNORMAL LOW
Potassium: 3.2 — ABNORMAL LOW
Total Bilirubin: 1
Total Bilirubin: 1.9 — ABNORMAL HIGH

## 2010-11-06 LAB — URINALYSIS, ROUTINE W REFLEX MICROSCOPIC
Glucose, UA: NEGATIVE
Nitrite: NEGATIVE
Protein, ur: NEGATIVE

## 2010-11-06 LAB — DIFFERENTIAL
Basophils Absolute: 0
Basophils Relative: 0
Eosinophils Absolute: 0.1 — ABNORMAL LOW
Neutro Abs: 3.4
Neutrophils Relative %: 67

## 2010-11-06 LAB — HEPATIC FUNCTION PANEL
ALT: 57 — ABNORMAL HIGH
Indirect Bilirubin: 1.5 — ABNORMAL HIGH
Total Protein: 6.1

## 2010-11-06 LAB — BASIC METABOLIC PANEL
CO2: 22
CO2: 24
Calcium: 6.6 — ABNORMAL LOW
Chloride: 107
Creatinine, Ser: 1.63 — ABNORMAL HIGH
GFR calc Af Amer: 54 — ABNORMAL LOW
GFR calc Af Amer: >60
Potassium: 3.5
Sodium: 136

## 2010-11-06 LAB — LIPID PANEL
Triglycerides: 224 — ABNORMAL HIGH
VLDL: 45 — ABNORMAL HIGH

## 2010-11-06 LAB — FERRITIN: Ferritin: 4758 — ABNORMAL HIGH (ref 22–322)

## 2010-11-06 LAB — CARDIAC PANEL(CRET KIN+CKTOT+MB+TROPI)
CK, MB: 1.1
Total CK: 450 — ABNORMAL HIGH

## 2010-11-06 LAB — OCCULT BLOOD (STOOL CUP TO LAB): Fecal Occult Bld: NEGATIVE

## 2010-11-06 LAB — FOLATE RBC: RBC Folate: 397

## 2010-11-06 LAB — MAGNESIUM: Magnesium: 1.6

## 2011-02-21 ENCOUNTER — Encounter: Payer: Self-pay | Admitting: Family Medicine

## 2011-02-21 ENCOUNTER — Ambulatory Visit (INDEPENDENT_AMBULATORY_CARE_PROVIDER_SITE_OTHER): Payer: Self-pay | Admitting: Family Medicine

## 2011-02-21 VITALS — BP 160/108 | HR 114 | Temp 99.3°F | Wt 172.0 lb

## 2011-02-21 DIAGNOSIS — I10 Essential (primary) hypertension: Secondary | ICD-10-CM

## 2011-02-21 DIAGNOSIS — F10239 Alcohol dependence with withdrawal, unspecified: Secondary | ICD-10-CM

## 2011-02-21 DIAGNOSIS — R569 Unspecified convulsions: Secondary | ICD-10-CM

## 2011-02-21 DIAGNOSIS — F1011 Alcohol abuse, in remission: Secondary | ICD-10-CM

## 2011-02-21 MED ORDER — METOPROLOL TARTRATE 100 MG PO TABS: 100.0000 mg | ORAL_TABLET | Freq: Two times a day (BID) | ORAL | Status: DC

## 2011-02-21 MED ORDER — VITAMIN B-1 100 MG PO TABS: 100.0000 mg | ORAL_TABLET | Freq: Every day | ORAL | Status: DC

## 2011-02-21 MED ORDER — AMLODIPINE BESYLATE 5 MG PO TABS: 5.0000 mg | ORAL_TABLET | Freq: Every day | ORAL | Status: DC

## 2011-02-21 MED ORDER — HYDROCHLOROTHIAZIDE 25 MG PO TABS: 25.0000 mg | ORAL_TABLET | Freq: Every day | ORAL | Status: DC

## 2011-02-21 MED ORDER — LISINOPRIL 20 MG PO TABS: 40.0000 mg | ORAL_TABLET | Freq: Every day | ORAL | Status: DC

## 2011-02-21 NOTE — Progress Notes (Signed)
  Subjective:    Patient ID: Jared Reyes, male    DOB: 09/25/1955, 56 y.o.   MRN: [id number]  HPI Here for med refills and to fill out DMV forms. He has not had insurance for several years so he has not been here in a year and a half. He feels fine. He needs DMV forms filled out as usual due to his hx of alcohol abuse. He still drinks some alcohol, but he says he may have a glass or two of wine with dinner about twice a month only. He ran out of meds a month ago.    Review of Systems  Constitutional: Negative.   Respiratory: Negative.   Cardiovascular: Negative.   Neurological: Negative.   Psychiatric/Behavioral: Negative.        Objective:   Physical Exam  Constitutional: He is oriented to person, place, and time. He appears well-developed and well-nourished.  Neck: No thyromegaly present.  Cardiovascular: Normal rate, regular rhythm, normal heart sounds and intact distal pulses.   Pulmonary/Chest: Effort normal and breath sounds normal.  Lymphadenopathy:    He has no cervical adenopathy.  Neurological: He is alert and oriented to person, place, and time.  Psychiatric: He has a normal mood and affect. His behavior is normal. Thought content normal.          Assessment & Plan:  Refilled meds. Filled out DMV forms. 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

## 2013-06-29 ENCOUNTER — Emergency Department (HOSPITAL_COMMUNITY)
Admission: EM | Admit: 2013-06-29 | Discharge: 2013-06-30 | Disposition: A | Discharge status: Other Acute Inpatient Hospital | Payer: Self-pay | Attending: Emergency Medicine | Admitting: Emergency Medicine

## 2013-06-29 ENCOUNTER — Encounter (HOSPITAL_COMMUNITY): Payer: Self-pay | Admitting: Emergency Medicine

## 2013-06-29 DIAGNOSIS — F17200 Nicotine dependence, unspecified, uncomplicated: Secondary | ICD-10-CM | POA: Insufficient documentation

## 2013-06-29 DIAGNOSIS — F1010 Alcohol abuse, uncomplicated: Secondary | ICD-10-CM

## 2013-06-29 DIAGNOSIS — F10239 Alcohol dependence with withdrawal, unspecified: Secondary | ICD-10-CM

## 2013-06-29 DIAGNOSIS — Z79899 Other long term (current) drug therapy: Secondary | ICD-10-CM | POA: Insufficient documentation

## 2013-06-29 DIAGNOSIS — F10939 Alcohol use, unspecified with withdrawal, unspecified: Secondary | ICD-10-CM

## 2013-06-29 DIAGNOSIS — E119 Type 2 diabetes mellitus without complications: Secondary | ICD-10-CM | POA: Insufficient documentation

## 2013-06-29 DIAGNOSIS — I10 Essential (primary) hypertension: Secondary | ICD-10-CM | POA: Insufficient documentation

## 2013-06-29 LAB — COMPREHENSIVE METABOLIC PANEL
ALT: 140 U/L — AB (ref 0–53)
AST: 209 U/L — AB (ref 0–37)
Albumin: 4.5 g/dL (ref 3.5–5.2)
Alkaline Phosphatase: 70 U/L (ref 39–117)
BUN: 4 mg/dL — ABNORMAL LOW (ref 6–23)
CALCIUM: 9.4 mg/dL (ref 8.4–10.5)
CO2: 22 mEq/L (ref 19–32)
Chloride: 90 mEq/L — ABNORMAL LOW (ref 96–112)
Creatinine, Ser: 0.68 mg/dL (ref 0.50–1.35)
GFR calc Af Amer: >90 mL/min (ref >90)
GFR calc non Af Amer: >90 mL/min (ref >90)
Glucose, Bld: 130 mg/dL — ABNORMAL HIGH (ref 70–99)
Potassium: 4.4 mEq/L (ref 3.7–5.3)
SODIUM: 130 meq/L — AB (ref 137–147)
TOTAL PROTEIN: 7.5 g/dL (ref 6.0–8.3)
Total Bilirubin: 0.9 mg/dL (ref 0.3–1.2)

## 2013-06-29 LAB — CBC
HCT: 48.7 % (ref 39.0–52.0)
Hemoglobin: 17.9 g/dL — ABNORMAL HIGH (ref 13.0–17.0)
MCH: 35.9 pg — AB (ref 26.0–34.0)
MCHC: 36.8 g/dL — AB (ref 30.0–36.0)
MCV: 97.6 fL (ref 78.0–100.0)
PLATELETS: 142 10*3/uL — AB (ref 150–400)
RBC: 4.99 MIL/uL (ref 4.22–5.81)
RDW: 12.8 % (ref 11.5–15.5)
WBC: 8.3 10*3/uL (ref 4.0–10.5)

## 2013-06-29 LAB — RAPID URINE DRUG SCREEN, HOSP PERFORMED
AMPHETAMINES: NOT DETECTED
Barbiturates: NOT DETECTED
Benzodiazepines: NOT DETECTED
Cocaine: NOT DETECTED
OPIATES: NOT DETECTED
TETRAHYDROCANNABINOL: NOT DETECTED

## 2013-06-29 LAB — SALICYLATE LEVEL: Salicylate Lvl: <2 mg/dL — ABNORMAL LOW (ref 2.8–20.0)

## 2013-06-29 LAB — ETHANOL: Alcohol, Ethyl (B): <11 mg/dL (ref 0–11)

## 2013-06-29 LAB — ACETAMINOPHEN LEVEL: Acetaminophen (Tylenol), Serum: <15 ug/mL (ref 10–30)

## 2013-06-29 MED ORDER — LORAZEPAM 2 MG/ML IJ SOLN: 0.0000 mg | Freq: Two times a day (BID) | INTRAMUSCULAR | Status: DC

## 2013-06-29 MED ORDER — SODIUM CHLORIDE 0.9 % IV SOLN: 1000.0000 mL | Freq: Once | INTRAVENOUS | Status: DC

## 2013-06-29 MED ORDER — ALUM & MAG HYDROXIDE-SIMETH 200-200-20 MG/5ML PO SUSP: 30.0000 mL | ORAL | Status: DC | PRN

## 2013-06-29 MED ORDER — SODIUM CHLORIDE 0.9 % IV BOLUS (SEPSIS)
1000.0000 mL | Freq: Once | INTRAVENOUS | Status: AC
Start: 2013-06-29 — End: 2013-06-29
  Administered 2013-06-29: 1000 mL via INTRAVENOUS

## 2013-06-29 MED ORDER — ZOLPIDEM TARTRATE 5 MG PO TABS
5.0000 mg | ORAL_TABLET | Freq: Every evening | ORAL | Status: DC | PRN
  Administered 2013-06-29: 5 mg via ORAL
  Filled 2013-06-29: qty 1

## 2013-06-29 MED ORDER — NICOTINE 21 MG/24HR TD PT24
21.0000 mg | MEDICATED_PATCH | Freq: Every day | TRANSDERMAL | Status: DC
  Administered 2013-06-29 – 2013-06-30 (×2): 21 mg via TRANSDERMAL
  Filled 2013-06-29 (×2): qty 1

## 2013-06-29 MED ORDER — ACETAMINOPHEN 325 MG PO TABS
650.0000 mg | ORAL_TABLET | ORAL | Status: DC | PRN
  Administered 2013-06-29: 650 mg via ORAL
  Filled 2013-06-29: qty 2

## 2013-06-29 MED ORDER — METOPROLOL TARTRATE 25 MG PO TABS
100.0000 mg | ORAL_TABLET | Freq: Once | ORAL | Status: AC
  Administered 2013-06-29: 100 mg via ORAL
  Filled 2013-06-29: qty 4

## 2013-06-29 MED ORDER — SODIUM CHLORIDE 0.9 % IV SOLN: 1000.0000 mL | INTRAVENOUS | Status: DC

## 2013-06-29 MED ORDER — LORAZEPAM 1 MG PO TABS
1.0000 mg | ORAL_TABLET | Freq: Once | ORAL | Status: AC
  Administered 2013-06-29: 1 mg via ORAL
  Filled 2013-06-29: qty 1

## 2013-06-29 MED ORDER — THIAMINE HCL 100 MG/ML IJ SOLN
Freq: Once | INTRAVENOUS | Status: AC
  Administered 2013-06-29: 15:00:00 via INTRAVENOUS
  Filled 2013-06-29: qty 1000

## 2013-06-29 MED ORDER — LORAZEPAM 2 MG/ML IJ SOLN
0.0000 mg | Freq: Four times a day (QID) | INTRAMUSCULAR | Status: DC
  Administered 2013-06-29 – 2013-06-30 (×3): 1 mg via INTRAVENOUS
  Filled 2013-06-29 (×3): qty 1

## 2013-06-29 MED ORDER — ONDANSETRON HCL 4 MG PO TABS: 4.0000 mg | ORAL_TABLET | Freq: Three times a day (TID) | ORAL | Status: DC | PRN

## 2013-06-29 NOTE — ED Provider Notes (Signed)
CSN: [id number]     Arrival date & time 06/29/13  1307 History   First MD Initiated Contact with Patient 06/29/13 1355     Chief Complaint  Patient presents with  . Medical Clearance  . Withdrawal     (Consider location/radiation/quality/duration/timing/severity/associated sxs/prior Treatment) HPI  58 year old male with history of alcoholism, anxiety, alcohol withdrawal seizure he was brought to the ED by his brother for request of alcohol detox.  Patient admits to being an alcoholic, he states he drinks for pleasure. He usually drinks 10 beers a day. Today noticed more body tremors than usual. He did drink 3 beers this morning however his tremors continue to persist. Therefore he is here requesting for help with alcohol detox. He is a smoker and smoked a pack a day. He denies any recreational drug use. He did not report any suicidal or homicidal ideation no hallucination. He denies any history of depression. Aside from the body tremors he has no other complaints. He report never seek help for his alcohol abuse before. He is currently denies fever, chills, chest pain, shortness of breath, productive cough, abdominal pain, back pain, numbness or weakness.   Past Medical History  Diagnosis Date  . Anxiety   . Hypertension   . Substance abuse     alcoholism  . Esophagitis   . Seizures 01-08-07    alcohol withdrawal    Past Surgical History  Procedure Laterality Date  . Hernia repair    . Tonsillectomy    . Hand surgery      left hand   Family History  Problem Relation Age of Onset  . Alcohol abuse      fhx  . Diabetes      fhx  . Hypertension      fhx   History  Substance Use Topics  . Smoking status: Current Every Day Smoker -- 1.00 packs/day    Types: Cigarettes  . Smokeless tobacco: Never Used     Comment: less than  1 pack  . Alcohol Use: 6.0 oz/week    10 Cans of beer per week     Comment: every day, occasional liquor    Review of Systems  All other systems  reviewed and are negative.     Allergies  Review of patient's allergies indicates no known allergies.  Home Medications   Prior to Admission medications   Medication Sig Start Date End Date Taking? Authorizing Provider  amLODipine (NORVASC) 5 MG tablet Take 1 tablet (5 mg total) by mouth daily. 02/21/11   Nelwyn Salisbury, MD  hydrochlorothiazide (HYDRODIURIL) 25 MG tablet Take 1 tablet (25 mg total) by mouth daily. 02/21/11   Nelwyn Salisbury, MD  lisinopril (PRINIVIL,ZESTRIL) 20 MG tablet Take 2 tablets (40 mg total) by mouth daily. 02/21/11   Nelwyn Salisbury, MD  metoprolol (LOPRESSOR) 100 MG tablet Take 1 tablet (100 mg total) by mouth 2 (two) times daily. 02/21/11   Nelwyn Salisbury, MD  thiamine (VITAMIN B-1) 100 MG tablet Take 1 tablet (100 mg total) by mouth daily. 02/21/11   Nelwyn Salisbury, MD   BP 182/116  Pulse 123  Temp(Src) 98.8 F (37.1 C) (Oral)  Resp 20  Wt 160 lb (72.576 kg)  SpO2 100% Physical Exam  Nursing note and vitals reviewed. Constitutional: He is oriented to person, place, and time.  Frail-appearing Caucasian male, older than stated age  HENT:  Head: Normocephalic and atraumatic.  Mouth/Throat: Oropharynx is clear and moist.  Eyes: Conjunctivae  are normal. Pupils are equal, round, and reactive to light.  Neck: Neck supple.  Cardiovascular:  Tachycardia without murmur rubs or gallops  Pulmonary/Chest: Effort normal and breath sounds normal. No respiratory distress. He has no wheezes.  Abdominal: Soft. There is no tenderness.  Well-healing midline abdominal surgical scar without any tenderness   Musculoskeletal:  Patient is tremulous throughout his upper extremities worsening with movement  Neurological: He is alert and oriented to person, place, and time.    ED Course  Procedures (including critical care time)  2:14 PM Patient with history of alcohol abuse she requesting for alcoholic detox. Last alcohol use was this morning. He appears to exhibit signs of  alcohol withdrawal. However he is mentating appropriately and no evidence of DT. He is tachycardic, and hypertensive. We'll provide benzodiazepine, IV fluid, workup initiated and patient will be consult for further management of his alcohol abuse. No seizure activities at this time.  Pt also is hypertensive, however report that he hasn't been taking able to afford his BP medication and haven't take it for the past several months.  Will give dose of metoprolol for his elevated BP.  3:13 PM Psych Hold and CIWA protocol initiated.  Awaits TTS for further evaluation.    Labs Review Labs Reviewed  CBC - Abnormal; Notable for the following:    Hemoglobin 17.9 (*)    MCH 35.9 (*)    MCHC 36.8 (*)    Platelets 142 (*)    All other components within normal limits  URINE RAPID DRUG SCREEN (HOSP PERFORMED)  ACETAMINOPHEN LEVEL  COMPREHENSIVE METABOLIC PANEL  ETHANOL  SALICYLATE LEVEL    Imaging Review No results found.   EKG Interpretation None      MDM   Final diagnoses:  Alcohol abuse  Alcohol withdrawal    BP 188/116  Pulse 115  Temp(Src) 98.8 F (37.1 C) (Oral)  Resp 13  Wt 160 lb (72.576 kg)  SpO2 97%     Fayrene HelperBowie Kairee Kozma, PA-C 06/29/13 1513

## 2013-06-29 NOTE — ED Notes (Addendum)
Patient hypertensive- denies headache, dizziness, weakness particular to one side. No slurred speech or facial droop noted. Patient sts he used to take the following BP meds but unable to due to finances:  40mg  PO lisinopril 5mg  PO amlodipine 25mg  HCTZ 100mg  PO lopressor x2 daily   [place]au PA made aware

## 2013-06-29 NOTE — ED Notes (Addendum)
Pharmacy notified to send banana bag

## 2013-06-29 NOTE — ED Notes (Signed)
telepsych set up in room.

## 2013-06-29 NOTE — BH Assessment (Signed)
Tele Assessment Note   Jared Reyes is an 58 y.o. male.  -Clinician talked to Dr. Oletta Cohn about patient.  Clinician mentioned that the PA had noted that patient had a seizure over 5 years ago.  Dr. Oletta Cohn said that it would appear the patient had it in association with ETOH detox.    Patient was brought to [facility] by his brother because he wants to detox from ETOH.  Patient said that he was shaking today and felt like he needed to quit and needed help this time.  Patient denies any SI, HI or A/V hallucinations.  Patient first drank when he was 58 years of age.  Currently he drinks at least 4 times per week.  He will drink anywhere from 2 to 10 twelve oz beers in a day.  He said he may drink liquor occasionally but it is maybe twice in a month.  Pt last drank this morning (06/01) and drank 3 beers.  Pt denies using any other substance and his UDS is negative.  Patient says that he has detoxed himself "cold [place]" three times before.  He has had once seizure associated with this and that was 5 years ago.  Patient has been to [facility] [employer] two years ago and relapsed because of loss of job then.  Recent stressor was that his own home was repossessed three months ago.  He is currently living in his parents' home.  Patient is agreeable to going to [facility] or [facility].  Patient said that his brother or a sister can help him get to either facility.  -Dr. Oletta Cohn was made aware of patient being referred to [facility].  Axis I: 303.9 ETOH use d/o, severe Axis II: Deferred Axis III:  Past Medical History  Diagnosis Date  . Anxiety   . Hypertension   . Substance abuse     alcoholism  . Esophagitis   . Seizures 01-08-07    alcohol withdrawal    Axis IV: economic problems and occupational problems Axis V: 31-40 impairment in reality testing  Past Medical History:  Past Medical History  Diagnosis Date  . Anxiety   . Hypertension   . Substance abuse     alcoholism  . Esophagitis   . Seizures 01-08-07     alcohol withdrawal     Past Surgical History  Procedure Laterality Date  . Hernia repair    . Tonsillectomy    . Hand surgery      left hand    Family History:  Family History  Problem Relation Age of Onset  . Alcohol abuse      fhx  . Diabetes      fhx  . Hypertension      fhx    Social History:  reports that he has been smoking Cigarettes.  He has been smoking about 1.00 pack per day. He has never used smokeless tobacco. He reports that he drinks about 6 ounces of alcohol per week. He reports that he does not use illicit drugs.  Additional Social History:  Alcohol / Drug Use Pain Medications: None Prescriptions: Cannot afford the HTN med he is supposed to be taking.  No prescribed meds. Over the Counter: N/A History of alcohol / drug use?: Yes Longest period of sobriety (when/how long): 5 years Negative Consequences of Use: Personal relationships Withdrawal Symptoms: Tingling;Tremors;Weakness;Patient aware of relationship between substance abuse and physical/medical complications;Seizures Onset of Seizures: Pt had detoxed "cold [place]." Date of most recent seizure: Pt states it  was 5 years ago.  CIWA: CIWA-Ar BP: 183/132 mmHg Pulse Rate: 124 Nausea and Vomiting: no nausea and no vomiting Tactile Disturbances: none Tremor: moderate, with patient's arms extended Auditory Disturbances: not present Paroxysmal Sweats: two Visual Disturbances: not present Anxiety: two Headache, Fullness in Head: very mild Agitation: normal activity Orientation and Clouding of Sensorium: oriented and can do serial additions CIWA-Ar Total: 9 COWS:    Allergies: No Known Allergies  Home Medications:  (Not in a hospital admission)  OB/GYN Status:  No LMP for male patient.  General Assessment Data Location of Assessment: [facility] ED Is this a Tele or Face-to-Face Assessment?: Tele Assessment Is this an Initial Assessment or a Re-assessment for this encounter?: Initial Assessment Living  Arrangements: Alone Can pt return to current living arrangement?: Yes Admission Status: Voluntary Is patient capable of signing voluntary admission?: Yes Transfer from: Acute Hospital Referral Source: Self/Family/Friend     [facility] Crisis Care Plan Living Arrangements: Alone Name of Psychiatrist: None Name of Therapist: N/A     Risk to self Suicidal Ideation: No Suicidal Intent: No Is patient at risk for suicide?: No Suicidal Plan?: No Access to Means: No What has been your use of drugs/alcohol within the last 12 months?: ETOH use frequent Previous Attempts/Gestures: No How many times?: 0 Other Self Harm Risks: None Triggers for Past Attempts: None known Intentional Self Injurious Behavior: None Family Suicide History: No Recent stressful life event(s): Financial Problems Persecutory voices/beliefs?: No Depression: No Depression Symptoms:  (Denies depressive symptoms.) Substance abuse history and/or treatment for substance abuse?: Yes Suicide prevention information given to non-admitted patients: Not applicable  Risk to Others Homicidal Ideation: No Thoughts of Harm to Others: No Current Homicidal Intent: No Current Homicidal Plan: No Access to Homicidal Means: No Identified Victim: No one History of harm to others?: No Assessment of Violence: None Noted Violent Behavior Description: N/A Does patient have access to weapons?: No (Pt reports "I sold 'em all.") Criminal Charges Pending?: No Does patient have a court date: No  Psychosis Hallucinations: None noted Delusions: None noted  Mental Status Report Appear/Hygiene: Disheveled;In scrubs Eye Contact: Good Motor Activity: Freedom of movement;Tremors Speech: Logical/coherent Level of Consciousness: Alert Mood: Apprehensive Affect: Apprehensive Anxiety Level: None Thought Processes: Coherent;Relevant Judgement: Unimpaired Orientation: Person;Place;Time;Situation Obsessive Compulsive Thoughts/Behaviors:  None  Cognitive Functioning Concentration: Normal Memory: Recent Intact;Remote Intact IQ: Average Insight: Good Impulse Control: Poor Appetite: Good Weight Loss: 0 Weight Gain: 0 Sleep: No Change Total Hours of Sleep: 8 Vegetative Symptoms: None  ADLScreening [facility] Assessment Services) Patient's cognitive ability adequate to safely complete daily activities?: Yes Patient able to express need for assistance with ADLs?: Yes Independently performs ADLs?: Yes (appropriate for developmental age)  Prior Inpatient Therapy Prior Inpatient Therapy: No Prior Therapy Dates: None Prior Therapy Facilty/Provider(s): N/A Reason for Treatment: N/A  Prior Outpatient Therapy Prior Outpatient Therapy: No Prior Therapy Dates: None Prior Therapy Facilty/Provider(s): None Reason for Treatment: None  ADL Screening (condition at time of admission) Patient's cognitive ability adequate to safely complete daily activities?: Yes Is the patient deaf or have difficulty hearing?: No Does the patient have difficulty seeing, even when wearing glasses/contacts?: No (Pt does have glasses.) Does the patient have difficulty concentrating, remembering, or making decisions?: No Patient able to express need for assistance with ADLs?: Yes Does the patient have difficulty dressing or bathing?: No Independently performs ADLs?: Yes (appropriate for developmental age) Does the patient have difficulty walking or climbing stairs?: No Weakness of Legs: None Weakness of Arms/Hands: None  Home Assistive Devices/Equipment Home Assistive Devices/Equipment: None    Abuse/Neglect Assessment (Assessment to be complete while patient is alone) Physical Abuse: Denies Verbal Abuse: Denies Sexual Abuse: Denies Exploitation of patient/patient's resources: Denies Self-Neglect: Denies Values / Beliefs Cultural Requests During Hospitalization: None Spiritual Requests During Hospitalization: None   Advance Directives (For  Healthcare) Advance Directive: Patient does not have advance directive;Patient would not like information Pre-existing out of facility DNR order (yellow form or pink MOST form): No Nutrition Screen- [facility] Adult/WL/AP Patient's home diet: Regular  Additional Information 1:1 In Past 12 Months?: No CIRT Risk: No Elopement Risk: No Does patient have medical clearance?: Yes     Disposition:  Disposition Initial Assessment Completed for this Encounter: Yes Disposition of Patient: Inpatient treatment program;Referred to Type of inpatient treatment program: Adult Patient referred to: [facility]  Bubba CampMarcus R Jaime Grizzell 06/29/2013 10:33 PM

## 2013-06-29 NOTE — ED Notes (Signed)
To ED with brother with c/o "trying to quit drinking" -- admits to drinking 10 beers and some liquor every day for at least 2 years-- pt shaking, sweating, no appetite.

## 2013-06-29 NOTE — ED Provider Notes (Signed)
Medical screening examination/treatment/procedure(s) were performed by non-physician practitioner and as supervising physician I was immediately available for consultation/collaboration.   EKG Interpretation None       Elim Peale F Doyce Saling, MD 06/29/13 1943 

## 2013-06-29 NOTE — Progress Notes (Signed)
Tele Psych machine is being used at present by the extender NP Renata Caprice.  TTS will assess when the machine is free.

## 2013-06-30 ENCOUNTER — Encounter (HOSPITAL_COMMUNITY): Payer: Self-pay

## 2013-06-30 ENCOUNTER — Observation Stay (HOSPITAL_COMMUNITY)
Admission: AD | Admit: 2013-06-30 | Discharge: 2013-07-01 | Disposition: A | Discharge status: Home / Self Care | Payer: Self-pay | Source: Intra-hospital | Attending: Family | Admitting: Family

## 2013-06-30 DIAGNOSIS — F411 Generalized anxiety disorder: Secondary | ICD-10-CM | POA: Insufficient documentation

## 2013-06-30 DIAGNOSIS — K209 Esophagitis, unspecified without bleeding: Secondary | ICD-10-CM | POA: Insufficient documentation

## 2013-06-30 DIAGNOSIS — F17200 Nicotine dependence, unspecified, uncomplicated: Secondary | ICD-10-CM | POA: Insufficient documentation

## 2013-06-30 DIAGNOSIS — I10 Essential (primary) hypertension: Secondary | ICD-10-CM | POA: Insufficient documentation

## 2013-06-30 DIAGNOSIS — F1010 Alcohol abuse, uncomplicated: Principal | ICD-10-CM | POA: Insufficient documentation

## 2013-06-30 DIAGNOSIS — F332 Major depressive disorder, recurrent severe without psychotic features: Secondary | ICD-10-CM | POA: Insufficient documentation

## 2013-06-30 DIAGNOSIS — F1994 Other psychoactive substance use, unspecified with psychoactive substance-induced mood disorder: Secondary | ICD-10-CM | POA: Insufficient documentation

## 2013-06-30 MED ORDER — LORAZEPAM 1 MG PO TABS: 0.0000 mg | ORAL_TABLET | Freq: Two times a day (BID) | ORAL | Status: DC

## 2013-06-30 MED ORDER — MAGNESIUM HYDROXIDE 400 MG/5ML PO SUSP: 30.0000 mL | Freq: Every day | ORAL | Status: DC | PRN

## 2013-06-30 MED ORDER — CHLORDIAZEPOXIDE HCL 25 MG PO CAPS: 25.0000 mg | ORAL_CAPSULE | ORAL | Status: DC

## 2013-06-30 MED ORDER — ALUM & MAG HYDROXIDE-SIMETH 200-200-20 MG/5ML PO SUSP: 30.0000 mL | ORAL | Status: DC | PRN

## 2013-06-30 MED ORDER — CHLORDIAZEPOXIDE HCL 25 MG PO CAPS: 25.0000 mg | ORAL_CAPSULE | Freq: Four times a day (QID) | ORAL | Status: DC | PRN

## 2013-06-30 MED ORDER — ACETAMINOPHEN 325 MG PO TABS
650.0000 mg | ORAL_TABLET | Freq: Four times a day (QID) | ORAL | Status: DC | PRN
  Administered 2013-07-01: 650 mg via ORAL
  Filled 2013-06-30: qty 2

## 2013-06-30 MED ORDER — LORAZEPAM 1 MG PO TABS
1.0000 mg | ORAL_TABLET | Freq: Three times a day (TID) | ORAL | Status: DC | PRN
  Administered 2013-06-30: 1 mg via ORAL

## 2013-06-30 MED ORDER — HYDROXYZINE HCL 25 MG PO TABS
25.0000 mg | ORAL_TABLET | Freq: Four times a day (QID) | ORAL | Status: DC | PRN
  Administered 2013-07-01: 25 mg via ORAL
  Filled 2013-06-30: qty 1

## 2013-06-30 MED ORDER — HYDROXYZINE HCL 25 MG PO TABS
25.0000 mg | ORAL_TABLET | Freq: Four times a day (QID) | ORAL | Status: DC | PRN
  Administered 2013-06-30: 25 mg via ORAL
  Filled 2013-06-30: qty 1

## 2013-06-30 MED ORDER — ADULT MULTIVITAMIN W/MINERALS CH
1.0000 | ORAL_TABLET | Freq: Every day | ORAL | Status: DC
  Administered 2013-07-01: 1 via ORAL
  Filled 2013-06-30 (×3): qty 1

## 2013-06-30 MED ORDER — CHLORDIAZEPOXIDE HCL 25 MG PO CAPS: 25.0000 mg | ORAL_CAPSULE | Freq: Three times a day (TID) | ORAL | Status: DC

## 2013-06-30 MED ORDER — CHLORDIAZEPOXIDE HCL 25 MG PO CAPS: 25.0000 mg | ORAL_CAPSULE | Freq: Every day | ORAL | Status: DC

## 2013-06-30 MED ORDER — TRAZODONE HCL 50 MG PO TABS
50.0000 mg | ORAL_TABLET | Freq: Every evening | ORAL | Status: DC | PRN
  Administered 2013-06-30: 50 mg via ORAL
  Filled 2013-06-30 (×5): qty 1

## 2013-06-30 MED ORDER — AMLODIPINE BESYLATE 10 MG PO TABS
10.0000 mg | ORAL_TABLET | Freq: Once | ORAL | Status: AC
  Administered 2013-06-30: 10 mg via ORAL
  Filled 2013-06-30 (×2): qty 1

## 2013-06-30 MED ORDER — THIAMINE HCL 100 MG/ML IJ SOLN: 100.0000 mg | Freq: Once | INTRAMUSCULAR | Status: DC

## 2013-06-30 MED ORDER — LOPERAMIDE HCL 2 MG PO CAPS: 2.0000 mg | ORAL_CAPSULE | ORAL | Status: DC | PRN

## 2013-06-30 MED ORDER — VITAMIN B-1 100 MG PO TABS
100.0000 mg | ORAL_TABLET | Freq: Every day | ORAL | Status: DC
  Administered 2013-07-01: 100 mg via ORAL
  Filled 2013-06-30 (×3): qty 1

## 2013-06-30 MED ORDER — CHLORDIAZEPOXIDE HCL 25 MG PO CAPS
25.0000 mg | ORAL_CAPSULE | Freq: Four times a day (QID) | ORAL | Status: DC
  Administered 2013-06-30 – 2013-07-01 (×2): 25 mg via ORAL
  Filled 2013-06-30 (×2): qty 1

## 2013-06-30 MED ORDER — ONDANSETRON 4 MG PO TBDP: 4.0000 mg | ORAL_TABLET | Freq: Four times a day (QID) | ORAL | Status: DC | PRN

## 2013-06-30 MED ORDER — AMLODIPINE BESYLATE 10 MG PO TABS: 10.0000 mg | ORAL_TABLET | Freq: Every day | ORAL | Status: DC

## 2013-06-30 MED ORDER — LORAZEPAM 1 MG PO TABS
0.0000 mg | ORAL_TABLET | Freq: Four times a day (QID) | ORAL | Status: DC
  Filled 2013-06-30: qty 1

## 2013-06-30 NOTE — BH Assessment (Signed)
BHH Assessment Progress Note   Pt is to be seen by EDP because of hernia in scrotum per nurse Emelie Spinks.  Clinician talked to Cory Munch at [facility] and he declined patient due to the risk of medical complications.

## 2013-06-30 NOTE — ED Notes (Signed)
Pt with large hernia descending into scrotum. He has not had it evaluated in over 4 years. MD notified.

## 2013-06-30 NOTE — ED Notes (Signed)
Dr. Hyacinth Meeker at bedside for scrotum check of hernia. Berna Spare at [facility] notified.

## 2013-06-30 NOTE — ED Notes (Signed)
PATIENT DECLINED AT [facility]. PER MELISSA

## 2013-06-30 NOTE — BHH Counselor (Addendum)
Update - after speaking with Renata Caprice NP and Julieanne Cotton, pt will be going to OBS bed #1. Writer called and notified Musician.   Evette Cristal, [place] Assessment Counselor 12:41 pm   Per Julieanne Cotton at [facility], pt has been accepted to bed 303-2. Writer called and spoke w/ pt's RN Kriste Basque who have pt complete voluntary paperwork.  Evette Cristal, [place] Assessment Counselor 12:25 pm

## 2013-06-30 NOTE — ED Notes (Signed)
FAXED NEEDED FURTHER INFO TO MELISSA AT [facility]. SHE ADVISES THAT THEY MAY HAVE 1 BED OPEN UP TODAY. SHE WILL KNOW IN ABOUT AN HOUR

## 2013-06-30 NOTE — ED Provider Notes (Signed)
Patient accepted to Psych Obs. No new complaints. VSS  Gerhard Munch, MD 06/30/13 1253

## 2013-06-30 NOTE — ED Notes (Signed)
Jared Reyes with [facility] calling confirming assessment of scrotum by Dr. Hyacinth Meeker before placement.

## 2013-06-30 NOTE — ED Provider Notes (Signed)
The patient is examined, he has a large right hemiscrotal mass, this feels like bowels, there is bowel sounds in this mass, it is nontender, nonerythematous, no changes to the overlying scrotum. The patient states that it has been there for 6 years, it never goes back inside, he is aware that he needs an outpatient workup for this but at this time it does not appear to be an acute medical condition. The patient has no leukocytosis.  Vida Roller, MD 06/30/13 [phone]

## 2013-06-30 NOTE — ED Notes (Signed)
SPOKE TO NICOLE AT [facility]. PT HAS BEEN DECLINED AT THEIR FACILITY

## 2013-06-30 NOTE — BH Assessment (Signed)
BHH Assessment Progress Note   Patient declined at [facility] by Cory Munch due to medical acuity.  Ben at [facility] called at [phone] and was given information for their prescreen.  He said that their medical team would review information.  York Spaniel also that they may have medications there to address HTN but if patient can bring a 14 day supply that would help.  TTS staff to call [facility] back after 10:00 to see if they have accepted patient.

## 2013-06-30 NOTE — Progress Notes (Signed)
58 year old male that is requesting detail from ETOH. Patient has been drinking 8-10 beers daily. Patient lives alone and is unemployed and states that he is unable to purchase his Norvasc for blood pressure control. Patient also has a large abdominal hernia that he states will cost $20,000 to repair due to lack of insurance. 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 Patient states that he feels steady on his feet and has not fallen in the last six months. Patient's blood pressure has been runggning high and staff has been taking B/P manually. See doc flow sheet. Patient pleasant and cooperative.

## 2013-06-30 NOTE — Progress Notes (Signed)
Patient ID: Jared Reyes, male   DOB: 26-Feb-1955, 58 y.o.   MRN: [id number]  Pt alert and oriented. Denies pain or discomfort.  Pt has visible tremors noted.-SI/HI, -A/V hall. Verbally contracts for safety. Karleen Hampshire NP notified for detox orders. Will continue to monitor closely.

## 2013-07-01 DIAGNOSIS — F1994 Other psychoactive substance use, unspecified with psychoactive substance-induced mood disorder: Secondary | ICD-10-CM

## 2013-07-01 DIAGNOSIS — F332 Major depressive disorder, recurrent severe without psychotic features: Secondary | ICD-10-CM

## 2013-07-01 DIAGNOSIS — F1010 Alcohol abuse, uncomplicated: Secondary | ICD-10-CM

## 2013-07-01 NOTE — Discharge Summary (Signed)
[facility] OBS UNIT Discharge Summary  Patient Identification:  Jared Reyes Date of Evaluation:  07/01/2013 Chief Complaint:  DETOX SUBSTANCE ABUSE  Subjective: Pt was assessed by this NP alongside Dr. Dwyane Dee to determine disposition. Pt was evaluated this morning for both H&P and Discharge disposition as he came in on night shift. Pt seen and chart reviewed. Pt came to the OBS Unit last night from [facility] with complaints of a desire to detox from ETOH. Pt was tremulous with elevated BP initially. However, this AM, he was doing much better with vital signs within normal parameters as well as asymptomatic from a physical withdrawal standpoint. Pt has a desire to follow-up outpatient and has been given extensive resources to do so. Pt denies SI, HI, and AVH, contracts for safety. Pt also affirms stable housing and a good support system, mentioning that he will be contacting his sponsor.   History of Present Illness:: Jared Reyes is an 58 y.o. male. -Clinician talked to Dr. Waverly Ferrari about patient. Clinician mentioned that the PA had noted that patient had a seizure over 5 years ago. Dr. Waverly Ferrari said that it would appear the patient had it in association with ETOH detox. Patient was brought to [facility] by his brother because he wants to detox from ETOH. Patient said that he was shaking today and felt like he needed to quit and needed help this time. Patient denies any SI, HI or A/V hallucinations. Patient first drank when he was 58 years of age. Currently he drinks at least 4 times per week. He will drink anywhere from 2 to 10 twelve oz beers in a day. He said he may drink liquor occasionally but it is maybe twice in a month. Pt last drank this morning (06/01) and drank 3 beers. Pt denies using any other substance and his UDS is negative. Patient says that he has detoxed himself "cold [place]" three times before. He has had once seizure associated with this and that was 5 years ago. Patient has been to [facility] two  years ago and relapsed because of loss of job then. Recent stressor was that his own home was repossessed three months ago. He is currently living in his parents' home. Patient is agreeable to going to [facility] or [facility]. Patient said that his brother or a sister can help him get to either facility. Dr. Waverly Ferrari was made aware of patient being referred to [facility] & [facility].   Total Time spent with patient: 40 minutes  Psychiatric Specialty Exam: Physical Exam  Review of Systems  Constitutional: Negative.   HENT: Negative.   Eyes: Negative.   Respiratory: Negative.   Cardiovascular: Negative.   Gastrointestinal: Negative.   Genitourinary: Negative.   Musculoskeletal: Negative.   Skin: Negative.   Neurological: Negative.   Endo/Heme/Allergies: Negative.   Psychiatric/Behavioral: Positive for depression and substance abuse. The patient is nervous/anxious.     Blood pressure 121/87, pulse 105, temperature 98.2 F (36.8 C), weight 68.04 kg (150 lb).Body mass index is 22.14 kg/(m^2).  General Appearance: Fairly Groomed  Engineer, water::  Good  Speech:  Clear and Coherent  Volume:  Normal  Mood:  Anxious  Affect:  Appropriate  Thought Process:  Coherent and Goal Directed  Orientation:  Full (Time, Place, and Person)  Thought Content:  WDL  Suicidal Thoughts:  No  Homicidal Thoughts:  No  Memory:  Immediate;   Fair Recent;   Fair Remote;   Fair  Judgement:  Fair  Insight:  Fair  Psychomotor  Activity:  Normal  Concentration:  Good  Recall:  [place]: Fair  Akathisia:  No  Handed:    AIMS (if indicated):     Assets:  Resilience  Sleep:       Musculoskeletal: Strength & Muscle Tone: within normal limits Gait & Station: normal Patient leans: N/A   Past Medical History:   Past Medical History  Diagnosis Date  . Anxiety   . Hypertension   . Substance abuse     alcoholism  . Esophagitis   . Seizures 01-08-07    alcohol withdrawal    None. Allergies:  No  Known Allergies PTA Medications: No prescriptions prior to admission    Previous Psychotropic Medications:  Medication/Dose  SEE MAR               Substance Abuse History in the last 12 months:  yes  Consequences of Substance Abuse: Medical Consequences:  Hospitalization  Social History:  reports that he has been smoking Cigarettes.  He has been smoking about 1.00 pack per day. He has never used smokeless tobacco. He reports that he drinks about 6 ounces of alcohol per week. He reports that he does not use illicit drugs. Additional Social History:                      Family History:   Family History  Problem Relation Age of Onset  . Alcohol abuse      fhx  . Diabetes      fhx  . Hypertension      fhx    Results for orders placed during the hospital encounter of 06/29/13 (from the past 72 hour(s))  URINE RAPID DRUG SCREEN (HOSP PERFORMED)     Status: None   Collection Time    06/29/13  2:08 PM      Result Value Ref Range   Opiates NONE DETECTED  NONE DETECTED   Cocaine NONE DETECTED  NONE DETECTED   Benzodiazepines NONE DETECTED  NONE DETECTED   Amphetamines NONE DETECTED  NONE DETECTED   Tetrahydrocannabinol NONE DETECTED  NONE DETECTED   Barbiturates NONE DETECTED  NONE DETECTED   Comment:            DRUG SCREEN FOR MEDICAL PURPOSES     ONLY.  IF CONFIRMATION IS NEEDED     FOR ANY PURPOSE, NOTIFY LAB     WITHIN 5 DAYS.                LOWEST DETECTABLE LIMITS     FOR URINE DRUG SCREEN     Drug Class       Cutoff (ng/mL)     Amphetamine      1000     Barbiturate      200     Benzodiazepine   299     Tricyclics       242     Opiates          300     Cocaine          300     THC              50  ACETAMINOPHEN LEVEL     Status: None   Collection Time    06/29/13  2:18 PM      Result Value Ref Range   Acetaminophen (Tylenol), Serum <15.0  10 - 30 ug/mL   Comment:  THERAPEUTIC CONCENTRATIONS VARY     SIGNIFICANTLY. A RANGE OF 10-30      ug/mL MAY BE AN EFFECTIVE     CONCENTRATION FOR MANY PATIENTS.     HOWEVER, SOME ARE BEST TREATED     AT CONCENTRATIONS OUTSIDE THIS     RANGE.     ACETAMINOPHEN CONCENTRATIONS     >150 ug/mL AT 4 HOURS AFTER     INGESTION AND >50 ug/mL AT 12     HOURS AFTER INGESTION ARE     OFTEN ASSOCIATED WITH TOXIC     REACTIONS.  CBC     Status: Abnormal   Collection Time    06/29/13  2:18 PM      Result Value Ref Range   WBC 8.3  4.0 - 10.5 K/uL   RBC 4.99  4.22 - 5.81 MIL/uL   Hemoglobin 17.9 (*) 13.0 - 17.0 g/dL   HCT 48.7  39.0 - 52.0 %   MCV 97.6  78.0 - 100.0 fL   MCH 35.9 (*) 26.0 - 34.0 pg   MCHC 36.8 (*) 30.0 - 36.0 g/dL   RDW 12.8  11.5 - 15.5 %   Platelets 142 (*) 150 - 400 K/uL  COMPREHENSIVE METABOLIC PANEL     Status: Abnormal   Collection Time    06/29/13  2:18 PM      Result Value Ref Range   Sodium 130 (*) 137 - 147 mEq/L   Potassium 4.4  3.7 - 5.3 mEq/L   Chloride 90 (*) 96 - 112 mEq/L   CO2 22  19 - 32 mEq/L   Glucose, Bld 130 (*) 70 - 99 mg/dL   BUN 4 (*) 6 - 23 mg/dL   Creatinine, Ser 0.68  0.50 - 1.35 mg/dL   Calcium 9.4  8.4 - 10.5 mg/dL   Total Protein 7.5  6.0 - 8.3 g/dL   Albumin 4.5  3.5 - 5.2 g/dL   AST 209 (*) 0 - 37 U/L   Comment: HEMOLYSIS AT THIS LEVEL MAY AFFECT RESULT   ALT 140 (*) 0 - 53 U/L   Alkaline Phosphatase 70  39 - 117 U/L   Total Bilirubin 0.9  0.3 - 1.2 mg/dL   GFR calc non Af Amer >90  >90 mL/min   GFR calc Af Amer >90  >90 mL/min   Comment: (NOTE)     The eGFR has been calculated using the CKD EPI equation.     This calculation has not been validated in all clinical situations.     eGFR's persistently <90 mL/min signify possible Chronic Kidney     Disease.  ETHANOL     Status: None   Collection Time    06/29/13  2:18 PM      Result Value Ref Range   Alcohol, Ethyl (B) <11  0 - 11 mg/dL   Comment:            LOWEST DETECTABLE LIMIT FOR     SERUM ALCOHOL IS 11 mg/dL     FOR MEDICAL PURPOSES ONLY  SALICYLATE LEVEL      Status: Abnormal   Collection Time    06/29/13  2:18 PM      Result Value Ref Range   Salicylate Lvl <1.6 (*) 2.8 - 20.0 mg/dL   Psychological Evaluations:  Assessment:   DSM5:    Substance/Addictive Disorders:  Alcohol Related Disorder - Severe (303.90) Depressive Disorders:  Major Depressive Disorder - Severe (296.23)  AXIS I:  Alcohol Abuse, Major  Depression, 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 Anxiety   . Hypertension   . Substance abuse     alcoholism  . Esophagitis   . Seizures 01-08-07    alcohol withdrawal    AXIS IV:  other psychosocial or environmental problems and problems related to social environment    AXIS V:  61-70 mild symptoms   Treatment Plan/Recommendations:   Review of chart, vital signs, medications, and notes.  1-Medication management for depression and anxiety: Medications reviewed with the patient and she stated no untoward effects, unchanged.  2-Coping skills for depression, anxiety   3-Continue crisis stabilization and management  4-Address health issues--monitoring vital signs, stable  5-Treatment plan in progress to prevent relapse of depression and anxiety  Treatment Plan Summary: Daily contact with patient to assess and evaluate symptoms and progress in treatment Medication management  Current Medications:  Current Facility-Administered Medications  Medication Dose Route Frequency Provider Last Rate Last Dose  . acetaminophen (TYLENOL) tablet 650 mg  650 mg Oral Q6H PRN Benjamine Mola, FNP   650 mg at 07/01/13 0741  . alum & mag hydroxide-simeth (MAALOX/MYLANTA) 200-200-20 MG/5ML suspension 30 mL  30 mL Oral Q4H PRN Benjamine Mola, FNP      . chlordiazePOXIDE (LIBRIUM) capsule 25 mg  25 mg Oral Q6H PRN Laverle Hobby, PA-C      . chlordiazePOXIDE (LIBRIUM) capsule 25 mg  25 mg Oral QID Laverle Hobby, PA-C   25 mg at 07/01/13 0740   Followed by  .  [START ON 07/02/2013] chlordiazePOXIDE (LIBRIUM) capsule 25 mg  25 mg Oral TID Laverle Hobby, PA-C       Followed by  . [START ON 07/03/2013] chlordiazePOXIDE (LIBRIUM) capsule 25 mg  25 mg Oral BH-qamhs Spencer E Simon, PA-C       Followed by  . [START ON 07/04/2013] chlordiazePOXIDE (LIBRIUM) capsule 25 mg  25 mg Oral Daily Laverle Hobby, PA-C      . hydrOXYzine (ATARAX/VISTARIL) tablet 25 mg  25 mg Oral Q6H PRN Laverle Hobby, PA-C   25 mg at 07/01/13 5176  . loperamide (IMODIUM) capsule 2-4 mg  2-4 mg Oral PRN Laverle Hobby, PA-C      . magnesium hydroxide (MILK OF MAGNESIA) suspension 30 mL  30 mL Oral Daily PRN Benjamine Mola, FNP      . multivitamin with minerals tablet 1 tablet  1 tablet Oral Daily Laverle Hobby, PA-C   1 tablet at 07/01/13 0740  . ondansetron (ZOFRAN-ODT) disintegrating tablet 4 mg  4 mg Oral Q6H PRN Laverle Hobby, PA-C      . thiamine (B-1) injection 100 mg  100 mg Intramuscular Once [employer], PA-C      . thiamine (VITAMIN B-1) tablet 100 mg  100 mg Oral Daily Laverle Hobby, PA-C   100 mg at 07/01/13 0740  . traZODone (DESYREL) tablet 50 mg  50 mg Oral QHS,MR X 1 Laverle Hobby, PA-C   50 mg at 06/30/13 2118   No current outpatient prescriptions on file.    Benjamine Mola, FNP-BC 6/3/201511:05 AM

## 2013-07-01 NOTE — Progress Notes (Signed)
D/C instructions/meds/follow-up appointments reviewed, pt verbalized understanding, pt's belongings returned to pt, denies SI/HI/AVH, pt waiting for brother to bring him some clothing at to pick him up.

## 2013-07-01 NOTE — H&P (Signed)
[place] OBS UNIT H&P  Patient Identification:  Jared Reyes Date of Evaluation:  07/01/2013 Chief Complaint:  DETOX SUBSTANCE ABUSE  Subjective: Pt seen and chart reviewed. Pt came to the OBS Unit last night from [facility] with complaints of a desire to detox from ETOH. Pt was tremulous with elevated BP initially. However, this AM, he was doing much better with vital signs within normal parameters as well as asymptomatic from a physical withdrawal standpoint. Pt has a desire to follow-up outpatient and has been given extensive resources to do so. Pt denies SI, HI, and AVH, contracts for safety. Pt also affirms stable housing and a good support system, mentioning that he will be contacting his sponsor.   History of Present Illness:: Jared Reyes is an 58 y.o. male. -Clinician talked to Dr. Waverly Ferrari about patient. Clinician mentioned that the PA had noted that patient had a seizure over 5 years ago. Dr. Waverly Ferrari said that it would appear the patient had it in association with ETOH detox. Patient was brought to [facility] by his brother because he wants to detox from ETOH. Patient said that he was shaking today and felt like he needed to quit and needed help this time. Patient denies any SI, HI or A/V hallucinations. Patient first drank when he was 58 years of age. Currently he drinks at least 4 times per week. He will drink anywhere from 2 to 10 twelve oz beers in a day. He said he may drink liquor occasionally but it is maybe twice in a month. Pt last drank this morning (06/01) and drank 3 beers. Pt denies using any other substance and his UDS is negative. Patient says that he has detoxed himself "cold [place]" three times before. He has had once seizure associated with this and that was 5 years ago. Patient has been to [facility] two years ago and relapsed because of loss of job then. Recent stressor was that his own home was repossessed three months ago. He is currently living in his parents' home. Patient is agreeable  to going to [facility] or [facility]. Patient said that his brother or a sister can help him get to either facility. Dr. Waverly Ferrari was made aware of patient being referred to [facility] & [facility].   Total Time spent with patient: 40 minutes  Psychiatric Specialty Exam: Physical Exam  Review of Systems  Constitutional: Negative.   HENT: Negative.   Eyes: Negative.   Respiratory: Negative.   Cardiovascular: Negative.   Gastrointestinal: Negative.   Genitourinary: Negative.   Musculoskeletal: Negative.   Skin: Negative.   Neurological: Negative.   Endo/Heme/Allergies: Negative.   Psychiatric/Behavioral: Positive for depression and substance abuse. The patient is nervous/anxious.     Blood pressure 121/87, pulse 105, temperature 98.2 F (36.8 C), weight 68.04 kg (150 lb).Body mass index is 22.14 kg/(m^2).  General Appearance: Fairly Groomed  Engineer, water::  Good  Speech:  Clear and Coherent  Volume:  Normal  Mood:  Anxious  Affect:  Appropriate  Thought Process:  Coherent and Goal Directed  Orientation:  Full (Time, Place, and Person)  Thought Content:  WDL  Suicidal Thoughts:  No  Homicidal Thoughts:  No  Memory:  Immediate;   Fair Recent;   Fair Remote;   Fair  Judgement:  Fair  Insight:  Fair  Psychomotor Activity:  Normal  Concentration:  Good  Recall:  [employer] of Knowledge:Fair  Language: Fair  Akathisia:  No  Handed:    AIMS (if indicated):  Assets:  Resilience  Sleep:       Musculoskeletal: Strength & Muscle Tone: within normal limits Gait & Station: normal Patient leans: N/A   Past Medical History:   Past Medical History  Diagnosis Date  . Anxiety   . Hypertension   . Substance abuse     alcoholism  . Esophagitis   . Seizures 01-08-07    alcohol withdrawal    None. Allergies:  No Known Allergies PTA Medications: Prescriptions prior to admission  Medication Sig Dispense Refill  . ibuprofen (ADVIL,MOTRIN) 200 MG tablet Take 400 mg by mouth daily as needed (back  pain).        Previous Psychotropic Medications:  Medication/Dose  SEE MAR               Substance Abuse History in the last 12 months:  yes  Consequences of Substance Abuse: Medical Consequences:  Hospitalization  Social History:  reports that he has been smoking Cigarettes.  He has been smoking about 1.00 pack per day. He has never used smokeless tobacco. He reports that he drinks about 6 ounces of alcohol per week. He reports that he does not use illicit drugs. Additional Social History:                      Family History:   Family History  Problem Relation Age of Onset  . Alcohol abuse      fhx  . Diabetes      fhx  . Hypertension      fhx    Results for orders placed during the hospital encounter of 06/29/13 (from the past 72 hour(s))  URINE RAPID DRUG SCREEN (HOSP PERFORMED)     Status: None   Collection Time    06/29/13  2:08 PM      Result Value Ref Range   Opiates NONE DETECTED  NONE DETECTED   Cocaine NONE DETECTED  NONE DETECTED   Benzodiazepines NONE DETECTED  NONE DETECTED   Amphetamines NONE DETECTED  NONE DETECTED   Tetrahydrocannabinol NONE DETECTED  NONE DETECTED   Barbiturates NONE DETECTED  NONE DETECTED   Comment:            DRUG SCREEN FOR MEDICAL PURPOSES     ONLY.  IF CONFIRMATION IS NEEDED     FOR ANY PURPOSE, NOTIFY LAB     WITHIN 5 DAYS.                LOWEST DETECTABLE LIMITS     FOR URINE DRUG SCREEN     Drug Class       Cutoff (ng/mL)     Amphetamine      1000     Barbiturate      200     Benzodiazepine   530     Tricyclics       051     Opiates          300     Cocaine          300     THC              50  ACETAMINOPHEN LEVEL     Status: None   Collection Time    06/29/13  2:18 PM      Result Value Ref Range   Acetaminophen (Tylenol), Serum <15.0  10 - 30 ug/mL   Comment:            THERAPEUTIC CONCENTRATIONS VARY  SIGNIFICANTLY. A RANGE OF 10-30     ug/mL MAY BE AN EFFECTIVE     CONCENTRATION FOR MANY  PATIENTS.     HOWEVER, SOME ARE BEST TREATED     AT CONCENTRATIONS OUTSIDE THIS     RANGE.     ACETAMINOPHEN CONCENTRATIONS     >150 ug/mL AT 4 HOURS AFTER     INGESTION AND >50 ug/mL AT 12     HOURS AFTER INGESTION ARE     OFTEN ASSOCIATED WITH TOXIC     REACTIONS.  CBC     Status: Abnormal   Collection Time    06/29/13  2:18 PM      Result Value Ref Range   WBC 8.3  4.0 - 10.5 K/uL   RBC 4.99  4.22 - 5.81 MIL/uL   Hemoglobin 17.9 (*) 13.0 - 17.0 g/dL   HCT 48.7  39.0 - 52.0 %   MCV 97.6  78.0 - 100.0 fL   MCH 35.9 (*) 26.0 - 34.0 pg   MCHC 36.8 (*) 30.0 - 36.0 g/dL   RDW 12.8  11.5 - 15.5 %   Platelets 142 (*) 150 - 400 K/uL  COMPREHENSIVE METABOLIC PANEL     Status: Abnormal   Collection Time    06/29/13  2:18 PM      Result Value Ref Range   Sodium 130 (*) 137 - 147 mEq/L   Potassium 4.4  3.7 - 5.3 mEq/L   Chloride 90 (*) 96 - 112 mEq/L   CO2 22  19 - 32 mEq/L   Glucose, Bld 130 (*) 70 - 99 mg/dL   BUN 4 (*) 6 - 23 mg/dL   Creatinine, Ser 0.68  0.50 - 1.35 mg/dL   Calcium 9.4  8.4 - 10.5 mg/dL   Total Protein 7.5  6.0 - 8.3 g/dL   Albumin 4.5  3.5 - 5.2 g/dL   AST 209 (*) 0 - 37 U/L   Comment: HEMOLYSIS AT THIS LEVEL MAY AFFECT RESULT   ALT 140 (*) 0 - 53 U/L   Alkaline Phosphatase 70  39 - 117 U/L   Total Bilirubin 0.9  0.3 - 1.2 mg/dL   GFR calc non Af Amer >90  >90 mL/min   GFR calc Af Amer >90  >90 mL/min   Comment: (NOTE)     The eGFR has been calculated using the CKD EPI equation.     This calculation has not been validated in all clinical situations.     eGFR's persistently <90 mL/min signify possible Chronic Kidney     Disease.  ETHANOL     Status: None   Collection Time    06/29/13  2:18 PM      Result Value Ref Range   Alcohol, Ethyl (B) <11  0 - 11 mg/dL   Comment:            LOWEST DETECTABLE LIMIT FOR     SERUM ALCOHOL IS 11 mg/dL     FOR MEDICAL PURPOSES ONLY  SALICYLATE LEVEL     Status: Abnormal   Collection Time    06/29/13  2:18 PM       Result Value Ref Range   Salicylate Lvl <3.8 (*) 2.8 - 20.0 mg/dL   Psychological Evaluations:  Assessment:   DSM5:    Substance/Addictive Disorders:  Alcohol Related Disorder - Severe (303.90) Depressive Disorders:  Major Depressive Disorder - Severe (296.23)  AXIS I:  Alcohol Abuse, Major Depression, Recurrent severe and Substance Induced Mood  Disorder AXIS II:  Deferred AXIS III:   Past Medical History  Diagnosis Date  . Anxiety   . Hypertension   . Substance abuse     alcoholism  . Esophagitis   . Seizures 01-08-07    alcohol withdrawal    AXIS IV:  other psychosocial or environmental problems and problems related to social environment    AXIS V:  61-70 mild symptoms   Treatment Plan/Recommendations:   Review of chart, vital signs, medications, and notes.  1-Medication management for depression and anxiety: Medications reviewed with the patient and she stated no untoward effects, unchanged.  2-Coping skills for depression, anxiety   3-Continue crisis stabilization and management  4-Address health issues--monitoring vital signs, stable  5-Treatment plan in progress to prevent relapse of depression and anxiety  Treatment Plan Summary: Daily contact with patient to assess and evaluate symptoms and progress in treatment Medication management  Current Medications:  Current Facility-Administered Medications  Medication Dose Route Frequency Provider Last Rate Last Dose  . acetaminophen (TYLENOL) tablet 650 mg  650 mg Oral Q6H PRN Benjamine Mola, FNP   650 mg at 07/01/13 0741  . alum & mag hydroxide-simeth (MAALOX/MYLANTA) 200-200-20 MG/5ML suspension 30 mL  30 mL Oral Q4H PRN Benjamine Mola, FNP      . chlordiazePOXIDE (LIBRIUM) capsule 25 mg  25 mg Oral Q6H PRN Laverle Hobby, PA-C      . chlordiazePOXIDE (LIBRIUM) capsule 25 mg  25 mg Oral QID Laverle Hobby, PA-C   25 mg at 07/01/13 0740   Followed by  . [START ON 07/02/2013] chlordiazePOXIDE (LIBRIUM) capsule 25 mg   25 mg Oral TID Laverle Hobby, PA-C       Followed by  . [START ON 07/03/2013] chlordiazePOXIDE (LIBRIUM) capsule 25 mg  25 mg Oral BH-qamhs Spencer E Simon, PA-C       Followed by  . [START ON 07/04/2013] chlordiazePOXIDE (LIBRIUM) capsule 25 mg  25 mg Oral Daily Laverle Hobby, PA-C      . hydrOXYzine (ATARAX/VISTARIL) tablet 25 mg  25 mg Oral Q6H PRN Laverle Hobby, PA-C   25 mg at 07/01/13 1583  . loperamide (IMODIUM) capsule 2-4 mg  2-4 mg Oral PRN Laverle Hobby, PA-C      . magnesium hydroxide (MILK OF MAGNESIA) suspension 30 mL  30 mL Oral Daily PRN Benjamine Mola, FNP      . multivitamin with minerals tablet 1 tablet  1 tablet Oral Daily Laverle Hobby, PA-C   1 tablet at 07/01/13 0740  . ondansetron (ZOFRAN-ODT) disintegrating tablet 4 mg  4 mg Oral Q6H PRN Laverle Hobby, PA-C      . thiamine (B-1) injection 100 mg  100 mg Intramuscular Once [employer], PA-C      . thiamine (VITAMIN B-1) tablet 100 mg  100 mg Oral Daily Laverle Hobby, PA-C   100 mg at 07/01/13 0740  . traZODone (DESYREL) tablet 50 mg  50 mg Oral QHS,MR X 1 Laverle Hobby, PA-C   50 mg at 06/30/13 2118    Benjamine Mola, FNP-BC 6/3/201510:17 AM

## 2013-07-01 NOTE — [facility] Suicide Risk Assessment (Cosign Needed)
Suicide Risk Assessment  Discharge Assessment     Nursing information obtained from:  Patient Demographic factors:  Male;Caucasian;Low socioeconomic status Current Mental Status:  NA Loss Factors:  NA Historical Factors:  NA Risk Reduction Factors:  Sense of responsibility to family Total Time spent with patient: 40 minutes  CLINICAL FACTORS:   Severe Anxiety and/or Agitation Depression:   Anhedonia Comorbid alcohol abuse/dependence Hopelessness Impulsivity Alcohol/Substance Abuse/Dependencies  Psychiatric Specialty Exam:     Blood pressure 121/87, pulse 105, temperature 98.2 F (36.8 C), weight 68.04 kg (150 lb).Body mass index is 22.14 kg/(m^2).   General Appearance: Fairly Groomed  Patent attorney::  Good  Speech:  Clear and Coherent  Volume:  Normal  Mood:  Anxious  Affect:  Appropriate  Thought Process:  Coherent and Goal Directed  Orientation:  Full (Time, Place, and Person)  Thought Content:  WDL  Suicidal Thoughts:  No  Homicidal Thoughts:  No  Memory:  Immediate;   Fair Recent;   Fair Remote;   Fair  Judgement:  Fair  Insight:  Fair  Psychomotor Activity:  Normal  Concentration:  Good  Recall:  [employer] of Knowledge:Fair  Language: Fair  Akathisia:  No  Handed:    AIMS (if indicated):     Assets:  Resilience  Sleep:       Musculoskeletal: Strength & Muscle Tone: within normal limits Gait & Station: normal Patient leans: N/A  COGNITIVE FEATURES THAT CONTRIBUTE TO RISK:  Closed-mindedness    SUICIDE RISK:   Minimal: No identifiable suicidal ideation.  Patients presenting with no risk factors but with morbid ruminations; may be classified as minimal risk based on the severity of the depressive symptoms  PLAN OF CARE: -Discharge home with outpatient resources for alcohol detoxification.    Beau Fanny, FNP-BC 07/01/2013, 10:33 AM

## 2013-07-04 NOTE — Discharge Summary (Signed)
Patient evaluated, seems stable, can be discharged.

## 2013-07-04 NOTE — H&P (Signed)
Patient admitted to [facility]. Observation unit for detoxification

## 2013-09-01 ENCOUNTER — Encounter: Payer: Self-pay | Admitting: Family Medicine

## 2013-09-01 ENCOUNTER — Ambulatory Visit (INDEPENDENT_AMBULATORY_CARE_PROVIDER_SITE_OTHER): Payer: Self-pay | Admitting: Family Medicine

## 2013-09-01 VITALS — BP 184/106 | HR 121 | Temp 99.0°F | Ht 69.0 in | Wt 165.0 lb

## 2013-09-01 DIAGNOSIS — F1010 Alcohol abuse, uncomplicated: Secondary | ICD-10-CM

## 2013-09-01 DIAGNOSIS — Z8669 Personal history of other diseases of the nervous system and sense organs: Secondary | ICD-10-CM

## 2013-09-01 DIAGNOSIS — I10 Essential (primary) hypertension: Secondary | ICD-10-CM

## 2013-09-01 MED ORDER — AMLODIPINE BESYLATE 10 MG PO TABS: 10.0000 mg | ORAL_TABLET | Freq: Every day | ORAL | Status: DC

## 2013-09-01 MED ORDER — HYDROCHLOROTHIAZIDE 25 MG PO TABS: 25.0000 mg | ORAL_TABLET | Freq: Every day | ORAL | Status: DC

## 2013-09-01 NOTE — Progress Notes (Signed)
   Subjective:    Patient ID: Jared Reyes, male    DOB: [birth year], 58 y.o.   MRN: [id number]  HPI Here to fill out DMV forms and to treat his HTN. We last saw him in 2013 and of course he got off his BP meds. He had not had his BP checked for over a year until this summer. He had been off alcohol for a long time but in June he started binge drinking with liquor again. He wound up being [facility] alcohol [facility] 06-29-13. Since then he has not drunk any alcohol and he is attending AA meetings again. He feels fine but they told him to see us about his BP.    Review of Systems  Constitutional: Negative.   Respiratory: Negative.   Cardiovascular: Negative.   Neurological: Negative.        Objective:   Physical Exam  Constitutional: He is oriented to person, place, and time. He appears well-developed and well-nourished.  Cardiovascular: Normal rate, regular rhythm, normal heart sounds and intact distal pulses.   Pulmonary/Chest: Effort normal and breath sounds normal.  Neurological: He is alert and oriented to person, place, and time.          Assessment & Plan:  We will get back on Amlodipine and HCTZ. His recent labs were unremarkable including a BMET. Recheck in 3-4 weeks

## 2013-09-01 NOTE — Progress Notes (Signed)
Pre visit review using our clinic review tool, if applicable. No additional management support is needed unless otherwise documented below in the visit note. 

## 2013-09-02 ENCOUNTER — Telehealth: Payer: Self-pay | Admitting: Family Medicine

## 2013-09-02 NOTE — Telephone Encounter (Signed)
Relevant patient education mailed to patient.  

## 2013-09-22 ENCOUNTER — Ambulatory Visit: Payer: Self-pay | Admitting: Family Medicine

## 2013-09-25 ENCOUNTER — Encounter: Payer: Self-pay | Admitting: Family Medicine

## 2013-09-25 ENCOUNTER — Ambulatory Visit (INDEPENDENT_AMBULATORY_CARE_PROVIDER_SITE_OTHER): Payer: Self-pay | Admitting: Family Medicine

## 2013-09-25 VITALS — BP 133/79 | HR 99 | Temp 98.7°F | Ht 69.0 in | Wt 166.0 lb

## 2013-09-25 DIAGNOSIS — G4700 Insomnia, unspecified: Secondary | ICD-10-CM

## 2013-09-25 DIAGNOSIS — I10 Essential (primary) hypertension: Secondary | ICD-10-CM

## 2013-09-25 MED ORDER — TEMAZEPAM 30 MG PO CAPS: 30.0000 mg | ORAL_CAPSULE | Freq: Every evening | ORAL | Status: DC | PRN

## 2013-09-25 NOTE — Progress Notes (Signed)
   Subjective:    Patient ID: Jared Reyes, male    DOB: 05-09-1955, 58 y.o.   MRN: [id number]  HPI Here to follow up BP. After getting back on his meds, he feels well and his BP has normalized. He does describe a long hx of trouble sleeping however. OTC meds do not help.    Review of Systems  Constitutional: Negative.   Respiratory: Negative.   Cardiovascular: Negative.        Objective:   Physical Exam  Constitutional: He is oriented to person, place, and time. He appears well-developed and well-nourished.  Cardiovascular: Normal rate, regular rhythm, normal heart sounds and intact distal pulses.   Pulmonary/Chest: Effort normal and breath sounds normal.  Neurological: He is alert and oriented to person, place, and time.          Assessment & Plan:  Try Temazepam for sleep.

## 2013-09-25 NOTE — Progress Notes (Signed)
Pre visit review using our clinic review tool, if applicable. No additional management support is needed unless otherwise documented below in the visit note. 

## 2014-09-17 ENCOUNTER — Emergency Department (HOSPITAL_COMMUNITY)
Admission: EM | Admit: 2014-09-17 | Discharge: 2014-09-18 | Disposition: A | Discharge status: Home / Self Care | Payer: Self-pay | Attending: Emergency Medicine | Admitting: Emergency Medicine

## 2014-09-17 ENCOUNTER — Emergency Department (HOSPITAL_COMMUNITY): Payer: Self-pay

## 2014-09-17 ENCOUNTER — Encounter (HOSPITAL_COMMUNITY): Payer: Self-pay | Admitting: *Deleted

## 2014-09-17 DIAGNOSIS — I10 Essential (primary) hypertension: Secondary | ICD-10-CM | POA: Insufficient documentation

## 2014-09-17 DIAGNOSIS — Z720 Tobacco use: Secondary | ICD-10-CM | POA: Insufficient documentation

## 2014-09-17 DIAGNOSIS — E876 Hypokalemia: Secondary | ICD-10-CM | POA: Insufficient documentation

## 2014-09-17 DIAGNOSIS — Z79899 Other long term (current) drug therapy: Secondary | ICD-10-CM | POA: Insufficient documentation

## 2014-09-17 DIAGNOSIS — Z8719 Personal history of other diseases of the digestive system: Secondary | ICD-10-CM | POA: Insufficient documentation

## 2014-09-17 DIAGNOSIS — R569 Unspecified convulsions: Secondary | ICD-10-CM | POA: Insufficient documentation

## 2014-09-17 DIAGNOSIS — Z8659 Personal history of other mental and behavioral disorders: Secondary | ICD-10-CM | POA: Insufficient documentation

## 2014-09-17 LAB — CBC WITH DIFFERENTIAL/PLATELET
BASOS ABS: 0.1 10*3/uL (ref 0.0–0.1)
Basophils Relative: 1 % (ref 0–1)
EOS PCT: 2 % (ref 0–5)
Eosinophils Absolute: 0.1 10*3/uL (ref 0.0–0.7)
HEMATOCRIT: 42.5 % (ref 39.0–52.0)
Hemoglobin: 15.5 g/dL (ref 13.0–17.0)
LYMPHS PCT: 19 % (ref 12–46)
Lymphs Abs: 1.2 10*3/uL (ref 0.7–4.0)
MCH: 36.3 pg — AB (ref 26.0–34.0)
MCHC: 36.5 g/dL — AB (ref 30.0–36.0)
MCV: 99.5 fL (ref 78.0–100.0)
MONOS PCT: 12 % (ref 3–12)
Monocytes Absolute: 0.8 10*3/uL (ref 0.1–1.0)
Neutro Abs: 4.3 10*3/uL (ref 1.7–7.7)
Neutrophils Relative %: 66 % (ref 43–77)
Platelets: 161 10*3/uL (ref 150–400)
RBC: 4.27 MIL/uL (ref 4.22–5.81)
RDW: 13.7 % (ref 11.5–15.5)
WBC: 6.5 10*3/uL (ref 4.0–10.5)

## 2014-09-17 LAB — I-STAT CHEM 8, ED
BUN: 10 mg/dL (ref 6–20)
CALCIUM ION: 1.01 mmol/L — AB (ref 1.12–1.23)
CREATININE: 0.9 mg/dL (ref 0.61–1.24)
Chloride: 87 mmol/L — ABNORMAL LOW (ref 101–111)
GLUCOSE: 108 mg/dL — AB (ref 65–99)
HCT: 49 % (ref 39.0–52.0)
Hemoglobin: 16.7 g/dL (ref 13.0–17.0)
POTASSIUM: 2.9 mmol/L — AB (ref 3.5–5.1)
Sodium: 127 mmol/L — ABNORMAL LOW (ref 135–145)
TCO2: 26 mmol/L (ref 0–100)

## 2014-09-17 LAB — CBG MONITORING, ED: GLUCOSE-CAPILLARY: 127 mg/dL — AB (ref 65–99)

## 2014-09-17 MED ORDER — GADOBENATE DIMEGLUMINE 529 MG/ML IV SOLN
15.0000 mL | Freq: Once | INTRAVENOUS | Status: AC | PRN
  Administered 2014-09-17: 15 mL via INTRAVENOUS

## 2014-09-17 MED ORDER — POTASSIUM CHLORIDE CRYS ER 20 MEQ PO TBCR
40.0000 meq | EXTENDED_RELEASE_TABLET | Freq: Once | ORAL | Status: AC
  Administered 2014-09-17: 40 meq via ORAL
  Filled 2014-09-17: qty 2

## 2014-09-17 MED ORDER — LORAZEPAM 2 MG/ML IJ SOLN
1.0000 mg | Freq: Once | INTRAMUSCULAR | Status: AC
  Administered 2014-09-17: 1 mg via INTRAVENOUS
  Filled 2014-09-17: qty 1

## 2014-09-17 MED ORDER — SODIUM CHLORIDE 0.9 % IV SOLN
1000.0000 mg | Freq: Once | INTRAVENOUS | Status: AC
  Administered 2014-09-18: 1000 mg via INTRAVENOUS
  Filled 2014-09-17: qty 10

## 2014-09-17 NOTE — ED Provider Notes (Addendum)
CSN: [id number]     Arrival date & time 09/17/14  1926 History   First MD Initiated Contact with Patient 09/17/14 2002     Chief Complaint  Patient presents with  . Seizures     (Consider location/radiation/quality/duration/timing/severity/associated sxs/prior Treatment) HPI Comments: Neighbor states they saw patient walking carts to his yard he fell straight backwards and began having generalized shaking for 5 minutes that resolved. States approximately one month ago while they were driving in the car his eyes rolled back he started to have generalized shaking patient states he drinks 2-3 beers every day and has not stopped drinking recently. He denies any drug abuse. He is still taking blood pressure medication.  Patient is a 59 y.o. male presenting with seizures. The history is provided by the patient and a relative.  Seizures Seizure activity on arrival: no   Seizure type:  Grand mal Preceding symptoms: no sensation of an aura present, no dizziness and no vision change   Preceding symptoms comment:  States he has only slept 2 hoursin the last few days and just felt tired today Initial focality:  Unable to specify Episode characteristics: generalized shaking   Postictal symptoms: somnolence   Return to baseline: yes   Timing:  Once Progression:  Resolved Context: not alcohol withdrawal, not drug use, not fever and not possible medication ingestion   Context comment:  Pt drinks 2-3 beer every day and did drink them today Recent head injury:  No recent head injuries PTA treatment:  None History of seizures: yes   Severity:  Unable to specify Seizure control level:  Uncontrolled Current therapy:  None   Past Medical History  Diagnosis Date  . Anxiety   . Hypertension   . Substance abuse     alcoholism  . Esophagitis   . Seizures 01-08-07    alcohol withdrawal    Past Surgical History  Procedure Laterality Date  . Hernia repair    . Tonsillectomy    . Hand surgery       left hand  . Abdominal surgery     Family History  Problem Relation Age of Onset  . Alcohol abuse      fhx  . Diabetes      fhx  . Hypertension      fhx   Social History  Substance Use Topics  . Smoking status: Current Every Day Smoker -- 1.00 packs/day    Types: Cigarettes  . Smokeless tobacco: Never Used  . Alcohol Use: Yes     Comment: drinks beer daily    Review of Systems  Neurological: Positive for seizures.  All other systems reviewed and are negative.     Allergies  Review of patient's allergies indicates no known allergies.  Home Medications   Prior to Admission medications   Medication Sig Start Date End Date Taking? Authorizing Provider  acetaminophen (TYLENOL) 325 MG tablet Take 650 mg by mouth every 6 (six) hours as needed for mild pain, moderate pain or headache.    Yes Historical Provider, MD  amLODipine (NORVASC) 10 MG tablet Take 1 tablet (10 mg total) by mouth daily. 09/01/13  Yes Nelwyn Salisbury, MD  hydrochlorothiazide (HYDRODIURIL) 25 MG tablet Take 1 tablet (25 mg total) by mouth daily. 09/01/13  Yes Nelwyn Salisbury, MD  temazepam (RESTORIL) 30 MG capsule Take 1 capsule (30 mg total) by mouth at bedtime as needed for sleep. 09/25/13  Yes Nelwyn Salisbury, MD   There were no vitals taken  for this visit. Physical Exam  Constitutional: He is oriented to person, place, and time. He appears well-developed and well-nourished. No distress.  HENT:  Head: Normocephalic and atraumatic.  Mouth/Throat: Oropharynx is clear and moist.  Eyes: Conjunctivae and EOM are normal. Pupils are equal, round, and reactive to light.  Neck: Normal range of motion. Neck supple.  Cardiovascular: Normal rate, regular rhythm and intact distal pulses.   No murmur heard. Pulmonary/Chest: Effort normal and breath sounds normal. No respiratory distress. He has no wheezes. He has no rales.  Abdominal: Soft. He exhibits no distension. There is no tenderness. There is no rebound and no  guarding.  Musculoskeletal: Normal range of motion. He exhibits no edema or tenderness.  Neurological: He is alert and oriented to person, place, and time.  Skin: Skin is warm and dry. No rash noted. No erythema.  Psychiatric: He has a normal mood and affect. His behavior is normal.  Nursing note and vitals reviewed.   ED Course  Procedures (including critical care time) Labs Review Labs Reviewed  CBC WITH DIFFERENTIAL/PLATELET - Abnormal; Notable for the following:    MCH 36.3 (*)    MCHC 36.5 (*)    All other components within normal limits  CBG MONITORING, ED - Abnormal; Notable for the following:    Glucose-Capillary 127 (*)    All other components within normal limits  I-STAT CHEM 8, ED - Abnormal; Notable for the following:    Sodium 127 (*)    Potassium 2.9 (*)    Chloride 87 (*)    Glucose, Bld 108 (*)    Calcium, Ion 1.01 (*)    All other components within normal limits    Imaging Review Ct Head Wo Contrast  09/17/2014   CLINICAL DATA:  Patient status post fall.  Seizure.  EXAM: CT HEAD WITHOUT CONTRAST  TECHNIQUE: Contiguous axial images were obtained from the base of the skull through the vertex without intravenous contrast.  COMPARISON:  CT brain 01/07/2007; MRI brain 01/08/2007  FINDINGS: Ventricles and sulci are prominent compatible with atrophy. Bilateral thalamic infarcts. Bilateral basal ganglia lacunar infarcts. More focal masslike area of low attenuation within the right parietal lobe measuring 12 mm (image 18; series 2). No intracranial hemorrhage. Orbits are unremarkable. Visualized paranasal sinuses are unremarkable. Calvarium is intact.  IMPRESSION: Masslike area of low attenuation within the right parietal lobe is nonspecific and may be secondary to subacute infarct or chronic small vessel ischemic change however underlying mass is not excluded. Recommend correlation with pre and post contrast-enhanced brain MRI.  Bilateral thalamic lacunar infarcts.  Cortical  atrophy and chronic small vessel ischemic changes.   Electronically Signed   By: Annia Belt M.D.   On: 09/17/2014 21:26   Mr Laqueta Jean ZO Contrast  09/17/2014   CLINICAL DATA:  5 minutes seizure episode today, history of seizures. History of hypertension and substance abuse.  EXAM: MRI HEAD WITHOUT AND WITH CONTRAST  TECHNIQUE: Multiplanar, multiecho pulse sequences of the brain and surrounding structures were obtained without and with intravenous contrast.  CONTRAST:  15mL MULTIHANCE GADOBENATE DIMEGLUMINE 529 MG/ML IV SOLN  COMPARISON:  CT head September 17, 2014 at 2115 hours and MRI of the brain January 08, 2007  FINDINGS: The ventricles and sulci are proportionally mildly prominent for age. No mass lesions or mass effect with particular attention to the RIGHT parietal lobe. No abnormal parenchymal enhancement. No reduced diffusion to suggest acute ischemia. No susceptibility artifact to suggest hemorrhage ; punctate focus of  susceptibility artifact in RIGHT thalamus. Old bilateral thalamus, RIGHT basal ganglia lacunar infarcts. Patchy supratentorial white matter T2 hyperintensities. Old small RIGHT cerebellar infarct. Bilateral hippocampus demonstrate normal size, morphology and signal characteristics.  No abnormal extra-axial fluid collections. No extra-axial masses nor leptomeningeal enhancement. Normal major intracranial vascular flow voids seen at the skull base.  Status post bilateral ocular lens implants. No abnormal sellar expansion. RIGHT maxillary mucosal retention cyst without paranasal sinus air-fluid levels. Mastoid air cells are well aerated. No suspicious calvarial bone marrow signal. No abnormal sellar expansion. Craniocervical junction maintained.  IMPRESSION: No acute intracranial process. No brain mass with particular attention to the RIGHT parietal lobe.  Mild global parenchymal brain volume loss for age. Moderate white matter changes compatible with chronic small vessel ischemic disease in  addition to old bilateral thalamus and RIGHT basal ganglia lacunar infarcts.   Electronically Signed   By: Awilda Metro M.D.   On: 09/17/2014 23:19   I have personally reviewed and evaluated these images and lab results as part of my medical decision-making.   EKG Interpretation None      MDM   Final diagnoses:  Seizure  Hypokalemia    Patient presents today with symptoms concerning for seizure. He was walking across his yard and fell and started having generalized shaking.  Speaking with patient's family member they think he had a seizure approximately one month ago while he was in the car. At that time his eyes rolled in the back of the head and he began to have generalized shaking. Patient does not remember any of these. He denies any incontinence or any headache, trauma. He has never taken medication for seizures.  Patient had a CT which showed concern for possible brain mass versus acute versus subacute infarct. MRI however showed no signs of brain mass and no acute infarcts and small areas of chronic old infarct.   CBC within normal limits. Chem-8 with hyponatremia which appears to be chronic most likely for his chronic alcohol use and mild hypokalemia 2.9 which was replaced orally. Discussed with Dr. Cyril Mourning and he recommended starting patient on Keppra. Patient does not have any insurance and will have a hard time affording the Right. Left message with case management so patient can have follow-up. He will need to see a neurologist and continue on Keppra.   Gwyneth Sprout, MD 09/17/14 1610  Gwyneth Sprout, MD 09/18/14 [id number]

## 2014-09-17 NOTE — ED Notes (Signed)
Nurse currently drawing labs 

## 2014-09-17 NOTE — ED Notes (Signed)
Patient's neighbor called stating that the patient was walking across his yard and fell straight back and began to seize for about 5 minutes. The patient's brother was at the scene when EMS arrived and stated that the patient has had a seizure before and that it is not known if the patient takes any medications for seizures. Patient is alert and oriented at this time.

## 2014-09-18 MED ORDER — LEVETIRACETAM 500 MG PO TABS: 500.0000 mg | ORAL_TABLET | Freq: Two times a day (BID) | ORAL | Status: DC

## 2014-09-20 NOTE — Progress Notes (Signed)
[facility] received phone call from [facility] requesting follow up for patient possibly at the [facility].  Patient is unisured.  EDCM called patient at [phone].  Patient reports he was able to obtain his medications Sheralyn Boatman) today.  Patient reports, "I spoke to a Raynelle Fanning and the medicine would only cost me three dollars."  Patient was placed in [facility] program.  [facility] spoke to patient about follow up.  Patient reports he considers his pcp to be at [facility] but he is concerned he cannot afford to go there anymore.  He then went on to explain that his nephew goes to a place on [address] "Where they got him a doctor and then they sent him some place to get his medications for free."  [facility] asked patient if he was referring to the [facility]?  Patient unsure of name of facility. Patient reports he spoke to a Artist on the phone who will be sending him paperwork, patient unsure of content of paperwork.  EDCM discussed follow up at [facility].  Patient is agreeable to be seen at [facility] for follow up.  EDCM will email CM at [facility] in attempts to make a follow up appointment. Patient thankful for assistance.  No further EDCM needs at this time.

## 2014-09-21 ENCOUNTER — Telehealth: Payer: Self-pay

## 2014-09-21 NOTE — Telephone Encounter (Signed)
Called placed to the patient to discuss his plans for medical follow up.  He said that he was seeing Dr Clent Ridges but he is not sure that he can be seen there any longer because he has no insurance and is not working.  Instructed him to contact Dr Claris Che office and explain his concerns. He stated that he is interested in scheduling an appt at the [facility] if Dr Clent Ridges is not able to see him.   CM to call him back tomorrow to determine if he needs an appt at [facility] or if he is going to see Dr Clent Ridges.

## 2014-09-22 ENCOUNTER — Telehealth: Payer: Self-pay

## 2014-09-22 NOTE — Telephone Encounter (Signed)
Call placed to the patient to inquire about his plans for follow up.  He said that he would like to be seen at [facility] and an appointment was scheduled for 09/30/14 @ 1200 w/ Dr Venetia Night.  Instructed him to bring his medications to his appointment for the doctor to review w/ him and he verbalized understanding.

## 2014-09-30 ENCOUNTER — Encounter: Payer: Self-pay | Admitting: Family Medicine

## 2014-09-30 ENCOUNTER — Ambulatory Visit: Payer: Self-pay | Attending: Family Medicine | Admitting: Family Medicine

## 2014-09-30 VITALS — BP 144/98 | HR 106 | Temp 98.4°F | Ht 69.0 in | Wt 165.2 lb

## 2014-09-30 DIAGNOSIS — K4090 Unilateral inguinal hernia, without obstruction or gangrene, not specified as recurrent: Secondary | ICD-10-CM | POA: Insufficient documentation

## 2014-09-30 DIAGNOSIS — K4091 Unilateral inguinal hernia, without obstruction or gangrene, recurrent: Secondary | ICD-10-CM

## 2014-09-30 DIAGNOSIS — G4700 Insomnia, unspecified: Secondary | ICD-10-CM

## 2014-09-30 DIAGNOSIS — R569 Unspecified convulsions: Secondary | ICD-10-CM

## 2014-09-30 DIAGNOSIS — F1010 Alcohol abuse, uncomplicated: Secondary | ICD-10-CM

## 2014-09-30 DIAGNOSIS — Z720 Tobacco use: Secondary | ICD-10-CM | POA: Insufficient documentation

## 2014-09-30 DIAGNOSIS — E876 Hypokalemia: Secondary | ICD-10-CM

## 2014-09-30 DIAGNOSIS — I10 Essential (primary) hypertension: Secondary | ICD-10-CM

## 2014-09-30 LAB — BASIC METABOLIC PANEL
BUN: 11 mg/dL (ref 7–25)
CALCIUM: 10.5 mg/dL — AB (ref 8.6–10.3)
CO2: 29 mmol/L (ref 20–31)
Chloride: 89 mmol/L — ABNORMAL LOW (ref 98–110)
Creat: 1.01 mg/dL (ref 0.70–1.33)
Glucose, Bld: 94 mg/dL (ref 65–99)
Potassium: 4.1 mmol/L (ref 3.5–5.3)
SODIUM: 136 mmol/L (ref 135–146)

## 2014-09-30 MED ORDER — HYDROCHLOROTHIAZIDE 25 MG PO TABS: 25.0000 mg | ORAL_TABLET | Freq: Every day | ORAL | Status: DC

## 2014-09-30 MED ORDER — AMLODIPINE BESYLATE 10 MG PO TABS: 10.0000 mg | ORAL_TABLET | Freq: Every day | ORAL | Status: DC

## 2014-09-30 NOTE — Progress Notes (Signed)
Patient here as a new patient after being seen in the ED for seizure Patient did get his Keppra on Monday and has started to take it He reports normally drinking 4-6 beers per day and smokes 1 ppd cigarettes Patient reports last drink was Tuesday when he had half a beer at lunch He states needing refills on his amlodipine and hydrochlorothyazide

## 2014-09-30 NOTE — Patient Instructions (Signed)
Insomnia Insomnia is frequent trouble falling and/or staying asleep. Insomnia can be a long term problem or a short term problem. Both are common. Insomnia can be a short term problem when the wakefulness is related to a certain stress or worry. Long term insomnia is often related to ongoing stress during waking hours and/or poor sleeping habits. Overtime, sleep deprivation itself can make the problem worse. Every little thing feels more severe because you are overtired and your ability to cope is decreased. CAUSES   Stress, anxiety, and depression.  Poor sleeping habits.  Distractions such as TV in the bedroom.  Naps close to bedtime.  Engaging in emotionally charged conversations before bed.  Technical reading before sleep.  Alcohol and other sedatives. They may make the problem worse. They can hurt normal sleep patterns and normal dream activity.  Stimulants such as caffeine for several hours prior to bedtime.  Pain syndromes and shortness of breath can cause insomnia.  Exercise late at night.  Changing time zones may cause sleeping problems (jet lag). It is sometimes helpful to have someone observe your sleeping patterns. They should look for periods of not breathing during the night (sleep apnea). They should also look to see how long those periods last. If you live alone or observers are uncertain, you can also be observed at a sleep clinic where your sleep patterns will be professionally monitored. Sleep apnea requires a checkup and treatment. Give your caregivers your medical history. Give your caregivers observations your family has made about your sleep.  SYMPTOMS   Not feeling rested in the morning.  Anxiety and restlessness at bedtime.  Difficulty falling and staying asleep. TREATMENT   Your caregiver may prescribe treatment for an underlying medical disorders. Your caregiver can give advice or help if you are using alcohol or other drugs for self-medication. Treatment  of underlying problems will usually eliminate insomnia problems.  Medications can be prescribed for short time use. They are generally not recommended for lengthy use.  Over-the-counter sleep medicines are not recommended for lengthy use. They can be habit forming.  You can promote easier sleeping by making lifestyle changes such as:  Using relaxation techniques that help with breathing and reduce muscle tension.  Exercising earlier in the day.  Changing your diet and the time of your last meal. No night time snacks.  Establish a regular time to go to bed.  Counseling can help with stressful problems and worry.  Soothing music and white noise may be helpful if there are background noises you cannot remove.  Stop tedious detailed work at least one hour before bedtime. HOME CARE INSTRUCTIONS   Keep a diary. Inform your caregiver about your progress. This includes any medication side effects. See your caregiver regularly. Take note of:  Times when you are asleep.  Times when you are awake during the night.  The quality of your sleep.  How you feel the next day. This information will help your caregiver care for you.  Get out of bed if you are still awake after 15 minutes. Read or do some quiet activity. Keep the lights down. Wait until you feel sleepy and go back to bed.  Keep regular sleeping and waking hours. Avoid naps.  Exercise regularly.  Avoid distractions at bedtime. Distractions include watching television or engaging in any intense or detailed activity like attempting to balance the household checkbook.  Develop a bedtime ritual. Keep a familiar routine of bathing, brushing your teeth, climbing into bed at the same   time each night, listening to soothing music. Routines increase the success of falling to sleep faster.  Use relaxation techniques. This can be using breathing and muscle tension release routines. It can also include visualizing peaceful scenes. You can  also help control troubling or intruding thoughts by keeping your mind occupied with boring or repetitive thoughts like the old concept of counting sheep. You can make it more creative like imagining planting one beautiful flower after another in your backyard garden.  During your day, work to eliminate stress. When this is not possible use some of the previous suggestions to help reduce the anxiety that accompanies stressful situations. MAKE SURE YOU:   Understand these instructions.  Will watch your condition.  Will get help right away if you are not doing well or get worse. Document Released: 01/13/2000 Document Revised: 04/09/2011 Document Reviewed: 02/12/2007 Exit[employer] Patient Information 2015 [employer]. This information is not intended to replace advice given to you by your health care provider. Make sure you discuss any questions you have with your health care provider.  
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
## 2014-10-05 ENCOUNTER — Telehealth: Payer: Self-pay | Admitting: *Deleted

## 2014-10-05 NOTE — Telephone Encounter (Signed)
Verified name and date of birth and told patient his labs revealed mildly elevated hypercalcemia which could be secondary to hctz. Potassium is normal.  Patient verbalized understanding

## 2014-10-05 NOTE — Telephone Encounter (Signed)
-----   Message from Jaclyn Shaggy, MD sent at 10/01/2014  8:31 AM EDT ----- Labs reveal mildly elevated hypercalcemia which could be secondary to hctz. Potassium is normal.

## 2014-10-19 ENCOUNTER — Telehealth: Payer: Self-pay | Admitting: Family Medicine

## 2014-10-19 ENCOUNTER — Other Ambulatory Visit: Payer: Self-pay | Admitting: *Deleted

## 2014-10-19 DIAGNOSIS — R569 Unspecified convulsions: Secondary | ICD-10-CM

## 2014-10-19 MED ORDER — LEVETIRACETAM 500 MG PO TABS: 500.0000 mg | ORAL_TABLET | Freq: Two times a day (BID) | ORAL | Status: DC

## 2014-10-19 NOTE — Telephone Encounter (Signed)
Patient called requesting a med refill on levETIRAcetam (KEPPRA) 500 MG tablet. Please f/u

## 2014-10-22 ENCOUNTER — Encounter: Payer: Self-pay | Admitting: Family Medicine

## 2014-10-22 ENCOUNTER — Ambulatory Visit: Payer: Self-pay | Attending: Family Medicine | Admitting: Family Medicine

## 2014-10-22 ENCOUNTER — Ambulatory Visit: Payer: Self-pay

## 2014-10-22 VITALS — BP 138/91 | HR 108 | Temp 98.2°F | Resp 16 | Ht 68.0 in | Wt 164.0 lb

## 2014-10-22 DIAGNOSIS — R569 Unspecified convulsions: Secondary | ICD-10-CM | POA: Insufficient documentation

## 2014-10-22 DIAGNOSIS — Z720 Tobacco use: Secondary | ICD-10-CM | POA: Insufficient documentation

## 2014-10-22 DIAGNOSIS — I10 Essential (primary) hypertension: Secondary | ICD-10-CM | POA: Insufficient documentation

## 2014-10-22 DIAGNOSIS — M545 Low back pain, unspecified: Secondary | ICD-10-CM

## 2014-10-22 DIAGNOSIS — G4700 Insomnia, unspecified: Secondary | ICD-10-CM | POA: Insufficient documentation

## 2014-10-22 DIAGNOSIS — G8929 Other chronic pain: Secondary | ICD-10-CM

## 2014-10-22 HISTORY — DX: Low back pain, unspecified: M54.50

## 2014-10-22 HISTORY — DX: Other chronic pain: G89.29

## 2014-10-22 MED ORDER — ACETAMINOPHEN-CODEINE #3 300-30 MG PO TABS: 1.0000 | ORAL_TABLET | Freq: Every day | ORAL | Status: DC

## 2014-10-22 MED ORDER — LEVETIRACETAM 500 MG PO TABS: 500.0000 mg | ORAL_TABLET | Freq: Two times a day (BID) | ORAL | Status: DC

## 2014-10-22 MED ORDER — TRAZODONE HCL 50 MG PO TABS: 25.0000 mg | ORAL_TABLET | Freq: Every evening | ORAL | Status: DC | PRN

## 2014-10-22 MED ORDER — LEVETIRACETAM ER 500 MG PO TB24: 1000.0000 mg | ORAL_TABLET | Freq: Every day | ORAL | Status: DC

## 2014-10-22 NOTE — Progress Notes (Signed)
Patient ID: TUCKER MINTER, male   DOB: 19-Apr-1955, 59 y.o.   MRN: [id number]   Subjective:  Patient ID: Jared Reyes, male    DOB: 1955-05-22  Age: 59 y.o. MRN: [id number]  CC: Establish Care and Seizures  HPI Jared Reyes presents for f/u seizures and HTN   1. Seizures: seizure free since 09/17/14. Has had 2 seizures in his life. Last seizure was in the setting of insomnia. Patient has had insomnia for many years. He also has a history of alcohol abuse. He reports light drinking now. He is taking keppra. Last dose yesterday. He is sleepy on keppra. He has not seen a neurologist.   2. CHRONIC HYPERTENSION  Disease Monitoring  Blood pressure range: not checking   Chest pain: no   Dyspnea: no   Claudication: no   Medication compliance: yes  Medication Side Effects  Lightheadedness: no   Urinary frequency: no   Edema: no    3. HM: patient refuses flu shot.    Outpatient Prescriptions Prior to Visit  Medication Sig Dispense Refill  . amLODipine (NORVASC) 10 MG tablet Take 1 tablet (10 mg total) by mouth daily. 30 tablet 11  . hydrochlorothiazide (HYDRODIURIL) 25 MG tablet Take 1 tablet (25 mg total) by mouth daily. 30 tablet 11  . levETIRAcetam (KEPPRA) 500 MG tablet Take 1 tablet (500 mg total) by mouth 2 (two) times daily. 60 tablet 0   No facility-administered medications prior to visit.   Social History  Substance Use Topics  . Smoking status: Current Every Day Smoker -- 1.00 packs/day    Types: Cigarettes  . Smokeless tobacco: Never Used  . Alcohol Use: 25.2 oz/week    42 Cans of beer per week     Comment: drinks beer daily    ROS Review of Systems  Constitutional: Negative for fever, chills, fatigue and unexpected weight change.  Eyes: Negative for visual disturbance.  Respiratory: Negative for cough and shortness of breath.   Cardiovascular: Negative for chest pain, palpitations and leg swelling.  Gastrointestinal: Negative for nausea, vomiting, abdominal  pain, diarrhea, constipation and blood in stool.  Endocrine: Negative for polydipsia, polyphagia and polyuria.  Musculoskeletal: Positive for back pain. Negative for myalgias, arthralgias, gait problem and neck pain.  Skin: Negative for rash.  Allergic/Immunologic: Negative for immunocompromised state.  Neurological: Negative for seizures.  Hematological: Negative for adenopathy. Does not bruise/bleed easily.  Psychiatric/Behavioral: Positive for sleep disturbance. Negative for suicidal ideas and dysphoric mood. The patient is not nervous/anxious.   GAD-7: score of 10. 1-4,5. 2-1,2,3,6.   Objective:  BP 138/91 mmHg  Pulse 108  Temp(Src) 98.2 F (36.8 C) (Oral)  Resp 16  Ht  (1.727 m)  Wt 164 lb (74.39 kg)  BMI 24.94 kg/m2  SpO2 98%  BP/Weight 10/22/2014 09/30/2014 09/18/2014  Systolic BP 138 144 126  Diastolic BP 91 98 79  Wt. (Lbs) 164 165.2 -  BMI 24.94 24.38 -  Some encounter information is confidential and restricted. Go to Review Flowsheets activity to see all data.   Physical Exam  Constitutional: He appears well-developed and well-nourished. No distress.  HENT:  Head: Normocephalic and atraumatic.  Neck: Normal range of motion. Neck supple.  Cardiovascular: Normal rate, regular rhythm, normal heart sounds and intact distal pulses.   Pulmonary/Chest: Effort normal and breath sounds normal.  Abdominal: Soft. A hernia is present. Hernia confirmed positive in the ventral area and confirmed positive in the right inguinal area.  Musculoskeletal: He exhibits no  edema.  Neurological: He is alert.  Skin: Skin is warm and dry. No rash noted. No erythema.  Psychiatric: He has a normal mood and affect.     Assessment & Plan:   Problem List Items Addressed This Visit    Chronic low back pain - Primary (Chronic)   Relevant Medications   acetaminophen-codeine (TYLENOL #3) 300-30 MG per tablet   Essential hypertension (Chronic)   Insomnia (Chronic)   Relevant Medications    traZODone (DESYREL) 50 MG tablet   Seizures (Chronic)   Relevant Medications   levETIRAcetam (KEPPRA XR) 500 MG 24 hr tablet      Meds ordered this encounter  Medications  . traZODone (DESYREL) 50 MG tablet    Sig: Take 0.5-1 tablets (25-50 mg total) by mouth at bedtime as needed for sleep.    Dispense:  30 tablet    Refill:  3  . acetaminophen-codeine (TYLENOL #3) 300-30 MG per tablet    Sig: Take 1 tablet by mouth at bedtime.    Dispense:  30 tablet    Refill:  0  . levETIRAcetam (KEPPRA) 500 MG tablet    Sig: Take 1 tablet (500 mg total) by mouth 2 (two) times daily.    Dispense:  60 tablet    Refill:  1    Follow-up: Return in about 4 weeks (around 11/19/2014).   Dessa Phi MD

## 2014-10-22 NOTE — Progress Notes (Signed)
Establish Care  Hx Sz, last episode 09/17/2014 Hx tobacco 1ppday

## 2014-10-22 NOTE — Patient Instructions (Signed)
Mr. Cina,  Thank you for coming in today. It was a pleasure meeting you. I look forward to being your primary doctor.  1. Seizures: Be sure to get regular sleep, aim for 8 hrs a day Trazodone to help with sleep Tylenol #3 at night to help with back pain Limit alcohol intake to 1-2 drinks on any given day   keppra is available today, no payment needed today. Ordered keppra XR which is available through PASS  If seizure free for 3 months will begin to taper keppra.   Please apply for [facility] discount and orange card, you can also inquire if any of your medications are on the PASS (medications assistance) list.   F/u in 4-6 weeks for seizures  Dr. Armen Pickup

## 2014-11-19 ENCOUNTER — Telehealth: Payer: Self-pay | Admitting: Family Medicine

## 2014-11-19 NOTE — Telephone Encounter (Signed)
Pt. Called requesting a med refill on levETIRAcetam (KEPPRA XR) 500 MG 24 hr tablet. Pt. Stated he runs out of his med on Sunday. Please f/u with pt.

## 2014-11-22 NOTE — Telephone Encounter (Signed)
Pt has one more refill on Keppra  Transfer call to Pharmacy

## 2014-12-03 ENCOUNTER — Encounter: Payer: Self-pay | Admitting: Family Medicine

## 2014-12-03 ENCOUNTER — Ambulatory Visit: Payer: Self-pay | Attending: Family Medicine | Admitting: Family Medicine

## 2014-12-03 VITALS — BP 146/90 | HR 117 | Temp 97.8°F | Resp 16 | Ht 68.0 in | Wt 161.0 lb

## 2014-12-03 DIAGNOSIS — G8929 Other chronic pain: Secondary | ICD-10-CM | POA: Insufficient documentation

## 2014-12-03 DIAGNOSIS — M545 Low back pain, unspecified: Secondary | ICD-10-CM

## 2014-12-03 DIAGNOSIS — R8290 Unspecified abnormal findings in urine: Secondary | ICD-10-CM

## 2014-12-03 DIAGNOSIS — G4700 Insomnia, unspecified: Secondary | ICD-10-CM

## 2014-12-03 DIAGNOSIS — R000 Tachycardia, unspecified: Secondary | ICD-10-CM

## 2014-12-03 LAB — POCT URINALYSIS DIPSTICK
BILIRUBIN UA: NEGATIVE
Glucose, UA: NEGATIVE
Leukocytes, UA: NEGATIVE
NITRITE UA: NEGATIVE
PH UA: 6.5
Spec Grav, UA: 1.02
UROBILINOGEN UA: 2

## 2014-12-03 LAB — TSH: TSH: 0.662 u[IU]/mL (ref 0.350–4.500)

## 2014-12-03 MED ORDER — CLONAZEPAM 0.5 MG PO TABS: 0.5000 mg | ORAL_TABLET | Freq: Every day | ORAL | Status: DC

## 2014-12-03 MED ORDER — ACETAMINOPHEN-CODEINE #3 300-30 MG PO TABS: 1.0000 | ORAL_TABLET | Freq: Three times a day (TID) | ORAL | Status: DC | PRN

## 2014-12-03 NOTE — Progress Notes (Signed)
Patient ID: JW COVIN, male   DOB: 1955/02/04, 59 y.o.   MRN: [id number] .chwe Patient ID: QUADIR MUNS, male   DOB: December 04, 1955, 59 y.o.   MRN: [id number]   Subjective:  Patient ID: MOSHE WENGER, male    DOB: 07-29-1955  Age: 59 y.o. MRN: [id number]  CC: Back Pain and Seizures  HPI Lawrance VERNEL LANGENDERFER presents for f/u seizures and tachycardia   1. Seizures: seizure free since 09/17/14. Has had 2 seizures in his life. Last seizure was in the setting of insomnia. Patient has had insomnia for many years. He also has a history of alcohol abuse. He reports light drinking now. He last drank yesterday afternoon.   2. Tachycardia: HR higher than usual. Patient reports last drink yesterday. He smoked 6 cigarettes today. He denies illicit drug use.  He reports hx of anxiety. No CP or SOB. No fever. Compliant with HCTZ. Drinking mostly diet soda.   3. Chronic low back pain: for many years. Worsening pain. Taking tylenol #3 at night which helps. Would like more to take during the day.   4. Insomnia: persistent. Patient has not started trazodone. Sleeps very little most days,     Social History  Substance Use Topics  . Smoking status: Current Every Day Smoker -- 1.00 packs/day    Types: Cigarettes  . Smokeless tobacco: Never Used  . Alcohol Use: 25.2 oz/week    42 Cans of beer per week     Comment: drinks beer daily    Outpatient Prescriptions Prior to Visit  Medication Sig Dispense Refill  . acetaminophen-codeine (TYLENOL #3) 300-30 MG per tablet Take 1 tablet by mouth at bedtime. 30 tablet 0  . amLODipine (NORVASC) 10 MG tablet Take 1 tablet (10 mg total) by mouth daily. 30 tablet 11  . hydrochlorothiazide (HYDRODIURIL) 25 MG tablet Take 1 tablet (25 mg total) by mouth daily. 30 tablet 11  . levETIRAcetam (KEPPRA XR) 500 MG 24 hr tablet Take 2 tablets (1,000 mg total) by mouth daily. 60 tablet 1  . traZODone (DESYREL) 50 MG tablet Take 0.5-1 tablets (25-50 mg total) by mouth at bedtime as  needed for sleep. 30 tablet 3   No facility-administered medications prior to visit.   Social History  Substance Use Topics  . Smoking status: Current Every Day Smoker -- 1.00 packs/day    Types: Cigarettes  . Smokeless tobacco: Never Used  . Alcohol Use: 25.2 oz/week    42 Cans of beer per week     Comment: drinks beer daily    ROS Review of Systems  Constitutional: Negative for fever, chills, fatigue and unexpected weight change.  Eyes: Negative for visual disturbance.  Respiratory: Negative for cough and shortness of breath.   Cardiovascular: Negative for chest pain, palpitations and leg swelling.  Gastrointestinal: Negative for nausea, vomiting, abdominal pain, diarrhea, constipation and blood in stool.  Endocrine: Negative for polydipsia, polyphagia and polyuria.  Musculoskeletal: Positive for back pain. Negative for myalgias, arthralgias, gait problem and neck pain.  Skin: Negative for rash.  Allergic/Immunologic: Negative for immunocompromised state.  Neurological: Negative for seizures.  Hematological: Negative for adenopathy. Does not bruise/bleed easily.  Psychiatric/Behavioral: Positive for sleep disturbance. Negative for suicidal ideas and dysphoric mood. The patient is nervous/anxious.     Objective:  BP 146/90 mmHg  Pulse 117  Temp(Src) 97.8 F (36.6 C) (Oral)  Resp 16  Ht  (1.727 m)  Wt 161 lb (73.029 kg)  BMI 24.49 kg/m2  SpO2 96%  Pulse Readings from Last 3 Encounters:  12/03/14 117  10/22/14 108  09/30/14 106   BP/Weight 12/03/2014 10/22/2014 09/30/2014  Systolic BP 146 138 144  Diastolic BP 90 91 98  Wt. (Lbs) 161 164 165.2  BMI 24.49 24.94 24.38  Some encounter information is confidential and restricted. Go to Review Flowsheets activity to see all data.   Physical Exam  Constitutional: He appears well-developed and well-nourished. No distress.  HENT:  Head: Normocephalic and atraumatic.  Neck: Normal range of motion. Neck supple.    Cardiovascular: Regular rhythm, normal heart sounds and intact distal pulses.  Tachycardia present.   Pulmonary/Chest: Effort normal and breath sounds normal.  Abdominal: Soft. A hernia is present. Hernia confirmed positive in the ventral area and confirmed positive in the right inguinal area.  Musculoskeletal: He exhibits no edema.  Neurological: He is alert.  Skin: Skin is warm and dry. No rash noted. No erythema.  Psychiatric: He has a normal mood and affect.   UA: moderate RBC, > 300 protein, sp grav 1.020   Assessment & Plan:   Problem List Items Addressed This Visit    Chronic low back pain - Primary (Chronic)   Relevant Medications   acetaminophen-codeine (TYLENOL #3) 300-30 MG tablet   Insomnia (Chronic)   Relevant Medications   clonazePAM (KLONOPIN) 0.5 MG tablet   Tachycardia   Relevant Medications   clonazePAM (KLONOPIN) 0.5 MG tablet   Other Relevant Orders   TSH   POCT urinalysis dipstick (Completed)      No orders of the defined types were placed in this encounter.    Follow-up: No Follow-up on file.   Dessa PhiJosalyn Juanantonio Stolar MD

## 2014-12-03 NOTE — Patient Instructions (Addendum)
Nicoles was seen today for back pain and seizures.  Diagnoses and all orders for this visit:  Chronic low back pain -     acetaminophen-codeine (TYLENOL #3) 300-30 MG tablet; Take 1 tablet by mouth every 8 (eight) hours as needed for moderate pain.  Tachycardia -     POCT urinalysis dipstick -     TSH -     clonazePAM (KLONOPIN) 0.5 MG tablet; Take 1 tablet (0.5 mg total) by mouth at bedtime.  Insomnia -     clonazePAM (KLONOPIN) 0.5 MG tablet; Take 1 tablet (0.5 mg total) by mouth at bedtime.   Take tylenol #3 twice daily as needed for back pain Add klonopin 0.5 mg at night for insomnia. Do not mix with alcohol. This will also help with elevated heart rate.   Continue BP medicine  F/u in 4 weeks for insomnia and tachycardia.   Dr. Armen PickupFunches

## 2014-12-03 NOTE — Progress Notes (Signed)
F/U Sz. No episode since last visit C/C complaining of back pain  Pain scale #10 Hx tobacco user 1ppday

## 2014-12-31 ENCOUNTER — Ambulatory Visit: Payer: Self-pay | Attending: Family Medicine | Admitting: Family Medicine

## 2014-12-31 ENCOUNTER — Encounter: Payer: Self-pay | Admitting: Family Medicine

## 2014-12-31 VITALS — BP 134/86 | HR 99 | Temp 98.3°F | Resp 16 | Ht 68.0 in | Wt 160.0 lb

## 2014-12-31 DIAGNOSIS — Z8711 Personal history of peptic ulcer disease: Secondary | ICD-10-CM

## 2014-12-31 DIAGNOSIS — R0981 Nasal congestion: Secondary | ICD-10-CM | POA: Insufficient documentation

## 2014-12-31 DIAGNOSIS — F17210 Nicotine dependence, cigarettes, uncomplicated: Secondary | ICD-10-CM | POA: Insufficient documentation

## 2014-12-31 DIAGNOSIS — Z8719 Personal history of other diseases of the digestive system: Secondary | ICD-10-CM | POA: Insufficient documentation

## 2014-12-31 DIAGNOSIS — G8929 Other chronic pain: Secondary | ICD-10-CM

## 2014-12-31 DIAGNOSIS — G4700 Insomnia, unspecified: Secondary | ICD-10-CM

## 2014-12-31 DIAGNOSIS — Z23 Encounter for immunization: Secondary | ICD-10-CM

## 2014-12-31 DIAGNOSIS — R569 Unspecified convulsions: Secondary | ICD-10-CM

## 2014-12-31 DIAGNOSIS — M545 Low back pain: Secondary | ICD-10-CM | POA: Insufficient documentation

## 2014-12-31 DIAGNOSIS — R000 Tachycardia, unspecified: Secondary | ICD-10-CM

## 2014-12-31 DIAGNOSIS — Z0000 Encounter for general adult medical examination without abnormal findings: Secondary | ICD-10-CM

## 2014-12-31 DIAGNOSIS — F1020 Alcohol dependence, uncomplicated: Secondary | ICD-10-CM | POA: Insufficient documentation

## 2014-12-31 DIAGNOSIS — Z79899 Other long term (current) drug therapy: Secondary | ICD-10-CM | POA: Insufficient documentation

## 2014-12-31 HISTORY — DX: Personal history of peptic ulcer disease: Z87.11

## 2014-12-31 MED ORDER — LEVETIRACETAM ER 500 MG PO TB24: 1000.0000 mg | ORAL_TABLET | Freq: Every day | ORAL | Status: DC

## 2014-12-31 MED ORDER — CLONAZEPAM 0.5 MG PO TABS: 0.5000 mg | ORAL_TABLET | Freq: Every day | ORAL | Status: DC

## 2014-12-31 MED ORDER — CETIRIZINE HCL 10 MG PO TABS: 10.0000 mg | ORAL_TABLET | Freq: Every day | ORAL | Status: DC

## 2014-12-31 MED ORDER — ACETAMINOPHEN-CODEINE #3 300-30 MG PO TABS: 1.0000 | ORAL_TABLET | Freq: Three times a day (TID) | ORAL | Status: DC | PRN

## 2014-12-31 NOTE — Assessment & Plan Note (Signed)
A: seizures in setting of alcohol abuse. Most likely ETOH withdrawal seizures. Seizure free x nearly 4 months. P: Taper off keppra once seizure free x 6 months

## 2014-12-31 NOTE — Progress Notes (Signed)
Patient ID: Jared Reyes, male   DOB: March 11, 1955, 59 y.o.   MRN: [id number]   Subjective:  Patient ID: Jared Reyes, male    DOB: 12/05/1955  Age: 59 y.o. MRN: [id number]  CC: Insomnia and Tachycardia  HPI Jared Reyes presents for f/u seizures and tachycardia, he reports congestion. He is uninsured. He does not have orange card.   1. Seizures: seizure free since 09/17/14. Has had 2 seizures in his life. Last seizure was in the setting of insomnia. Patient has had insomnia for many years. He also has a history of alcohol abuse. He request keppra refill.   2. Tachycardia: resolved. He is takes nightly klonopin. He drinks less. He is staying well hydrated.   3. Insomnia: improved with nightly klonopin.   4. Congestion: x 4 days. No fever or chills. Frontal sinus pressure. Brother at home is also sick. Questions if he can take a decongestant.    Social History  Substance Use Topics  . Smoking status: Current Every Day Smoker -- 1.00 packs/day    Types: Cigarettes  . Smokeless tobacco: Never Used  . Alcohol Use: 25.2 oz/week    42 Cans of beer per week     Comment: drinks beer daily    Outpatient Prescriptions Prior to Visit  Medication Sig Dispense Refill  . acetaminophen-codeine (TYLENOL #3) 300-30 MG tablet Take 1 tablet by mouth every 8 (eight) hours as needed for moderate pain. 60 tablet 0  . amLODipine (NORVASC) 10 MG tablet Take 1 tablet (10 mg total) by mouth daily. 30 tablet 11  . clonazePAM (KLONOPIN) 0.5 MG tablet Take 1 tablet (0.5 mg total) by mouth at bedtime. 30 tablet 0  . hydrochlorothiazide (HYDRODIURIL) 25 MG tablet Take 1 tablet (25 mg total) by mouth daily. 30 tablet 11  . levETIRAcetam (KEPPRA XR) 500 MG 24 hr tablet Take 2 tablets (1,000 mg total) by mouth daily. 60 tablet 1   No facility-administered medications prior to visit.   Social History  Substance Use Topics  . Smoking status: Current Every Day Smoker -- 1.00 packs/day    Types: Cigarettes  .  Smokeless tobacco: Never Used  . Alcohol Use: 25.2 oz/week    42 Cans of beer per week     Comment: drinks beer daily    ROS Review of Systems  Constitutional: Negative for fever, chills, fatigue and unexpected weight change.  HENT: Positive for congestion and hearing loss (chronic in R ear ).   Eyes: Negative for visual disturbance.  Respiratory: Negative for cough and shortness of breath.   Cardiovascular: Negative for chest pain, palpitations and leg swelling.  Gastrointestinal: Negative for nausea, vomiting, abdominal pain, diarrhea, constipation and blood in stool.  Endocrine: Negative for polydipsia, polyphagia and polyuria.  Musculoskeletal: Positive for back pain. Negative for myalgias, arthralgias, gait problem and neck pain.  Skin: Negative for rash.  Allergic/Immunologic: Negative for immunocompromised state.  Neurological: Negative for seizures.  Hematological: Negative for adenopathy. Does not bruise/bleed easily.  Psychiatric/Behavioral: Positive for sleep disturbance. Negative for suicidal ideas and dysphoric mood. The patient is nervous/anxious.     Objective:  BP 134/86 mmHg  Pulse 99  Temp(Src) 98.3 F (36.8 C) (Oral)  Resp 16  Ht  (1.727 m)  Wt 160 lb (72.576 kg)  BMI 24.33 kg/m2  SpO2 99%  Pulse Readings from Last 3 Encounters:  12/31/14 99  12/03/14 117  10/22/14 108   BP/Weight 12/31/2014 12/03/2014 10/22/2014  Systolic BP 134 146 138  Diastolic BP 86 90 91  Wt. (Lbs) 160 161 164  BMI 24.33 24.49 24.94  Some encounter information is confidential and restricted. Go to Review Flowsheets activity to see all data.   Physical Exam  Constitutional: He appears well-developed and well-nourished. No distress.  HENT:  Head: Normocephalic and atraumatic.  Right Ear: Tympanic membrane, external ear and ear canal normal. Decreased hearing (chronic per patient ) is noted.  Left Ear: Hearing, tympanic membrane, external ear and ear canal normal.  Nose: No  mucosal edema.  Mouth/Throat: Oropharynx is clear and moist and mucous membranes are normal. Abnormal dentition. Dental caries present. No dental abscesses.  Neck: Normal range of motion. Neck supple.  Cardiovascular: Regular rhythm, normal heart sounds and intact distal pulses.   Pulmonary/Chest: Effort normal and breath sounds normal.  Musculoskeletal: He exhibits no edema.  Neurological: He is alert.  Skin: Skin is warm and dry. No rash noted. No erythema.  Psychiatric: He has a normal mood and affect.    Assessment & Plan:   Problem List Items Addressed This Visit    Chronic low back pain (Chronic)   Relevant Medications   acetaminophen-codeine (TYLENOL #3) 300-30 MG tablet   History of gastric ulcer - Primary (Chronic)   Insomnia (Chronic)   Relevant Medications   clonazePAM (KLONOPIN) 0.5 MG tablet   Seizures (HCC) (Chronic)    A: seizures in setting of alcohol abuse. Most likely ETOH withdrawal seizures. Seizure free x nearly 4 months. P: Taper off keppra once seizure free x 6 months       Relevant Medications   clonazePAM (KLONOPIN) 0.5 MG tablet   levETIRAcetam (KEPPRA XR) 500 MG 24 hr tablet   Sinus congestion   Relevant Medications   cetirizine (ZYRTEC) 10 MG tablet   RESOLVED: Tachycardia   Relevant Medications   clonazePAM (KLONOPIN) 0.5 MG tablet    Other Visit Diagnoses    Healthcare maintenance           No orders of the defined types were placed in this encounter.    Follow-up: No Follow-up on file.   Dessa PhiJosalyn Aracelly Tencza MD

## 2014-12-31 NOTE — Patient Instructions (Addendum)
Kippy was seen today for insomnia and tachycardia.  Diagnoses and all orders for this visit:  History of gastric ulcer  Sinus congestion -     cetirizine (ZYRTEC) 10 MG tablet; Take 1 tablet (10 mg total) by mouth daily.  Chronic low back pain -     acetaminophen-codeine (TYLENOL #3) 300-30 MG tablet; Take 1 tablet by mouth every 8 (eight) hours as needed for moderate pain.  Tachycardia -     clonazePAM (KLONOPIN) 0.5 MG tablet; Take 1 tablet (0.5 mg total) by mouth at bedtime.  Insomnia -     clonazePAM (KLONOPIN) 0.5 MG tablet; Take 1 tablet (0.5 mg total) by mouth at bedtime.  Seizures (HCC) -     levETIRAcetam (KEPPRA XR) 500 MG 24 hr tablet; Take 2 tablets (1,000 mg total) by mouth daily.   It is safe to take a short course, 3-5 days, of cough medicine with a decongestant for head cold, I have also prescribed zyrtec.   Please apply for [place] discount and orange card, you can also inquire if any of your medications are on the PASS (medications assistance) list.   F/u in 2 months, sooner if needed for seizures, will plan to taper keppra to off once seizure free for 6 months   Dr. Armen PickupFunches

## 2014-12-31 NOTE — Progress Notes (Signed)
F/U insomnia and tachycardia Pt stated sleeping medication helping No pain today  Tobacco user 1/2 ppday  No suicide thought in the past two weeks

## 2015-01-06 ENCOUNTER — Ambulatory Visit: Payer: Self-pay | Attending: Internal Medicine

## 2015-02-10 MED FILL — AMLODIPINE BESYLATE 10 MG T: 10 | 30 days supply | Qty: 30 | Fill #4

## 2015-02-10 MED FILL — HYDROCHLOROTHIAZIDE 25 MG T: 25 | 30 days supply | Qty: 30 | Fill #4

## 2015-02-10 MED FILL — ACETAMINOPHEN/COD #3 TABLET: 300-30 | 20 days supply | Qty: 60 | Fill #1

## 2015-02-28 MED FILL — LEVETIRACETAM ER 500 MG TAB: 500 | 30 days supply | Qty: 60 | Fill #1

## 2015-03-11 ENCOUNTER — Encounter: Payer: Self-pay | Admitting: Family Medicine

## 2015-03-11 ENCOUNTER — Ambulatory Visit: Payer: Self-pay | Attending: Family Medicine | Admitting: Family Medicine

## 2015-03-11 VITALS — BP 159/96 | HR 108 | Temp 98.6°F | Resp 16 | Ht 68.0 in | Wt 162.0 lb

## 2015-03-11 DIAGNOSIS — Z8673 Personal history of transient ischemic attack (TIA), and cerebral infarction without residual deficits: Secondary | ICD-10-CM | POA: Insufficient documentation

## 2015-03-11 DIAGNOSIS — R569 Unspecified convulsions: Secondary | ICD-10-CM | POA: Insufficient documentation

## 2015-03-11 DIAGNOSIS — Z0000 Encounter for general adult medical examination without abnormal findings: Secondary | ICD-10-CM

## 2015-03-11 DIAGNOSIS — G4700 Insomnia, unspecified: Secondary | ICD-10-CM

## 2015-03-11 DIAGNOSIS — Z79899 Other long term (current) drug therapy: Secondary | ICD-10-CM | POA: Insufficient documentation

## 2015-03-11 DIAGNOSIS — F17210 Nicotine dependence, cigarettes, uncomplicated: Secondary | ICD-10-CM | POA: Insufficient documentation

## 2015-03-11 DIAGNOSIS — R000 Tachycardia, unspecified: Secondary | ICD-10-CM

## 2015-03-11 DIAGNOSIS — I10 Essential (primary) hypertension: Secondary | ICD-10-CM

## 2015-03-11 DIAGNOSIS — F419 Anxiety disorder, unspecified: Secondary | ICD-10-CM

## 2015-03-11 HISTORY — DX: Personal history of transient ischemic attack (TIA), and cerebral infarction without residual deficits: Z86.73

## 2015-03-11 LAB — LIPID PANEL
CHOL/HDL RATIO: 1.8 ratio (ref <=5.0)
CHOLESTEROL: 199 mg/dL (ref 125–200)
HDL: 111 mg/dL (ref >=40)
LDL Cholesterol: 66 mg/dL (ref <130)
TRIGLYCERIDES: 108 mg/dL (ref <150)
VLDL: 22 mg/dL (ref <30)

## 2015-03-11 LAB — POCT GLYCOSYLATED HEMOGLOBIN (HGB A1C)

## 2015-03-11 MED ORDER — TRAZODONE HCL 100 MG PO TABS
50.0000 mg | ORAL_TABLET | Freq: Every day | ORAL | Status: DC
Start: 2015-03-11 — End: 2015-07-26

## 2015-03-11 MED ORDER — HYDROCHLOROTHIAZIDE 25 MG PO TABS: 25.0000 mg | ORAL_TABLET | Freq: Every day | ORAL | Status: DC

## 2015-03-11 MED ORDER — BUSPIRONE HCL 7.5 MG PO TABS: 7.5000 mg | ORAL_TABLET | Freq: Two times a day (BID) | ORAL | Status: DC

## 2015-03-11 MED ORDER — AMLODIPINE BESYLATE 10 MG PO TABS: 10.0000 mg | ORAL_TABLET | Freq: Every day | ORAL | Status: DC

## 2015-03-11 MED FILL — busPIRone HCL 7.5 MG TABS: 7.5 | 30 days supply | Qty: 60 | Fill #0

## 2015-03-11 MED FILL — traZODone HCL 100 MG TABS: 100 | 30 days supply | Qty: 30 | Fill #0

## 2015-03-11 MED FILL — HYDROCHLOROTHIAZIDE 25 MG T: 25 | 30 days supply | Qty: 30 | Fill #5

## 2015-03-11 MED FILL — AMLODIPINE BESYLATE 10 MG T: 10 | 30 days supply | Qty: 30 | Fill #5

## 2015-03-11 NOTE — Progress Notes (Signed)
F/U back pain, unable to sleep  Pain scale #10 Tobacco user 1/2 ppday  No suicidal thoughts in past two week

## 2015-03-11 NOTE — Progress Notes (Signed)
Subjective:  Patient ID: Jared Reyes, male    DOB: 12-28-1955  Age: 60 y.o. MRN: [id number]  CC: Hypertension; Cerebrovascular Accident; and Seizures   HPI Jared Reyes presents for     1. HTN: he is taking Norvasc. He denies HA, CP or SOB, swelling.  He admits to tremor. He admits to drinking 12 oz of beer 2-3 times per day. He admits to poor sleep.   2. Insomnia: improved with clonazepam. He has not been able to afford the $13 co-pay for the refill. He is now having trouble sleeping. He has chronic anxiety as well.   3. Seizures: none since hospitalization in 08/2014. This was believed to be due to alcohol withdrawal. He was not aware that his neuroimaging revealed remote strokes.   4. Remote strokes: he was not aware of this. He denies vision changes, weakness and numbness. He admits to chronic haziness in R eye due to cataract.    Social History  Substance Use Topics  . Smoking status: Current Every Day Smoker -- 1.00 packs/day    Types: Cigarettes  . Smokeless tobacco: Never Used  . Alcohol Use: 25.2 oz/week    42 Cans of beer per week     Comment: drinks beer daily    Outpatient Prescriptions Prior to Visit  Medication Sig Dispense Refill  . acetaminophen-codeine (TYLENOL #3) 300-30 MG tablet Take 1 tablet by mouth every 8 (eight) hours as needed for moderate pain. 60 tablet 2  . amLODipine (NORVASC) 10 MG tablet Take 1 tablet (10 mg total) by mouth daily. 30 tablet 11  . cetirizine (ZYRTEC) 10 MG tablet Take 1 tablet (10 mg total) by mouth daily. 30 tablet 3  . clonazePAM (KLONOPIN) 0.5 MG tablet Take 1 tablet (0.5 mg total) by mouth at bedtime. 30 tablet 2  . hydrochlorothiazide (HYDRODIURIL) 25 MG tablet Take 1 tablet (25 mg total) by mouth daily. 30 tablet 11  . levETIRAcetam (KEPPRA XR) 500 MG 24 hr tablet Take 2 tablets (1,000 mg total) by mouth daily. 60 tablet 2   No facility-administered medications prior to visit.    ROS Review of Systems    Constitutional: Negative for fever, chills, fatigue and unexpected weight change.  Eyes: Positive for visual disturbance (haziness in R eye ).  Respiratory: Negative for cough and shortness of breath.   Cardiovascular: Negative for chest pain, palpitations and leg swelling.  Gastrointestinal: Negative for nausea, vomiting, abdominal pain, diarrhea, constipation and blood in stool.  Endocrine: Negative for polydipsia, polyphagia and polyuria.  Musculoskeletal: Positive for back pain. Negative for myalgias, arthralgias, gait problem and neck pain.  Skin: Negative for rash.  Allergic/Immunologic: Negative for immunocompromised state.  Neurological: Positive for tremors. Negative for seizures.  Hematological: Negative for adenopathy. Does not bruise/bleed easily.  Psychiatric/Behavioral: Positive for sleep disturbance. Negative for suicidal ideas and dysphoric mood. The patient is nervous/anxious.     Objective:  BP 159/96 mmHg  Pulse 108  Temp(Src) 98.6 F (37 C) (Oral)  Resp 16  Ht  (1.727 m)  Wt 162 lb (73.483 kg)  BMI 24.64 kg/m2  SpO2 98%  BP/Weight 03/11/2015 12/31/2014 12/03/2014  Systolic BP 159 134 146  Diastolic BP 96 86 90  Wt. (Lbs) 162 160 161  BMI 24.64 24.33 24.49   Physical Exam  Constitutional: He appears well-developed and well-nourished. No distress.  HENT:  Head: Normocephalic and atraumatic.  Neck: Normal range of motion. Neck supple.  Cardiovascular: Normal rate, regular rhythm, normal heart  sounds and intact distal pulses.   Pulmonary/Chest: Effort normal and breath sounds normal.  Musculoskeletal: He exhibits no edema.  Neurological: He is alert.  Skin: Skin is warm and dry. No rash noted. No erythema.  Psychiatric: He has a normal mood and affect.   Lab Results  Component Value Date   HGBA1C 4 8 03/11/2015     Assessment & Plan:   Jared Reyes was seen today for hypertension, cerebrovascular accident and seizures.  Diagnoses and all orders for  this visit:  Healthcare maintenance -     HgB A1c  Essential hypertension -     amLODipine (NORVASC) 10 MG tablet; Take 1 tablet (10 mg total) by mouth daily. -     hydrochlorothiazide (HYDRODIURIL) 25 MG tablet; Take 1 tablet (25 mg total) by mouth daily.  History of stroke -     Lipid Panel -     Echocardiogram; Future -     US Carotid Duplex Bilateral; Future  Tachycardia  Insomnia -     traZODone (DESYREL) 100 MG tablet; Take 0.5-1 tablets (50-100 mg total) by mouth at bedtime.  Chronic anxiety -     busPIRone (BUSPAR) 7.5 MG tablet; Take 1 tablet (7.5 mg total) by mouth 2 (two) times daily.   Follow-up: No Follow-up on file.   Dessa Phi MD

## 2015-03-11 NOTE — Patient Instructions (Addendum)
Jared Reyes was seen today for back pain.  Diagnoses and all orders for this visit:  Healthcare maintenance -     HgB A1c  Essential hypertension -     amLODipine (NORVASC) 10 MG tablet; Take 1 tablet (10 mg total) by mouth daily. -     hydrochlorothiazide (HYDRODIURIL) 25 MG tablet; Take 1 tablet (25 mg total) by mouth daily.  History of stroke -     Lipid Panel -     Echocardiogram; Future -     US Carotid Duplex Bilateral; Future  Tachycardia  Insomnia -     traZODone (DESYREL) 100 MG tablet; Take 0.5-1 tablets (50-100 mg total) by mouth at bedtime.  Chronic anxiety -     busPIRone (BUSPAR) 7.5 MG tablet; Take 1 tablet (7.5 mg total) by mouth 2 (two) times daily.   F/u in 3 weeks for HTN with pharmacist   F/u with me in 6 week for HTN, anxiety   Dr. Armen Pickup

## 2015-03-11 NOTE — Assessment & Plan Note (Addendum)
Declined off clonazepam  Trazodone restarted

## 2015-03-12 NOTE — Assessment & Plan Note (Signed)
Declined off clonazepam Trazodone restarted buspar added

## 2015-03-12 NOTE — Assessment & Plan Note (Signed)
A: elevated BP in pulse in setting of worsening anxiety and insomnia, ongoing ETOH use. Patient unable to afford clonazepam P: Continue norvasc 10 mg and HCTZ 25 mg Treat insomnia and anxiety If BP and pulse remain elevated change norvasc to diltiazem

## 2015-03-12 NOTE — Assessment & Plan Note (Signed)
History of stroke without deficits Lipids with plan to initiate statin for secondary prevention  Patient not a candidate for daily ASA due to his hx of gastric ulcer and ongoing alcohol use CT head and carotid dopplers ordered

## 2015-03-14 MED ORDER — ATORVASTATIN CALCIUM 40 MG PO TABS
40.0000 mg | ORAL_TABLET | Freq: Every day | ORAL | Status: DC
Start: 2015-03-14 — End: 2016-05-07

## 2015-03-14 MED FILL — ATORVASTATIN 40 MG TABLET: 40 | 30 days supply | Qty: 30 | Fill #0

## 2015-03-14 NOTE — Addendum Note (Signed)
Addended by: Dessa Phi on: 03/14/2015 08:33 AM   Modules accepted: Orders, SmartSet

## 2015-03-15 ENCOUNTER — Telehealth: Payer: Self-pay

## 2015-03-15 NOTE — Telephone Encounter (Signed)
-----   Message from Dessa Phi, MD sent at 03/14/2015  8:31 AM EST ----- Cholesterol levels are normal  I recommend lipitor 40 mg daily given evidence of old stroke on CT head and MRI from 08/2014.  ECHO and carotid dopplers will also be done

## 2015-03-15 NOTE — Telephone Encounter (Signed)
Spoke with patient, patient verified name and DOB. Patient was given lab results and told about the new Rx for his cholesterol. Patient verbalized he understood with no further questions.

## 2015-03-17 MED FILL — ACETAMINOPHEN/COD #3 TABLET: 300-30 | 20 days supply | Qty: 60 | Fill #2

## 2015-03-25 ENCOUNTER — Ambulatory Visit (HOSPITAL_COMMUNITY): Payer: No Typology Code available for payment source | Attending: Cardiovascular Disease

## 2015-03-25 ENCOUNTER — Other Ambulatory Visit: Payer: Self-pay

## 2015-03-25 DIAGNOSIS — Z720 Tobacco use: Secondary | ICD-10-CM | POA: Insufficient documentation

## 2015-03-25 DIAGNOSIS — I119 Hypertensive heart disease without heart failure: Secondary | ICD-10-CM | POA: Insufficient documentation

## 2015-03-25 DIAGNOSIS — Z8673 Personal history of transient ischemic attack (TIA), and cerebral infarction without residual deficits: Secondary | ICD-10-CM

## 2015-03-30 MED FILL — LEVETIRACETAM ER 500 MG TAB: 500 | 30 days supply | Qty: 60 | Fill #2

## 2015-03-31 ENCOUNTER — Ambulatory Visit: Payer: No Typology Code available for payment source | Admitting: Pharmacist

## 2015-04-05 ENCOUNTER — Ambulatory Visit: Payer: No Typology Code available for payment source | Attending: Family Medicine | Admitting: Pharmacist

## 2015-04-05 ENCOUNTER — Encounter: Payer: Self-pay | Admitting: Pharmacist

## 2015-04-05 VITALS — BP 141/90 | HR 106

## 2015-04-05 DIAGNOSIS — I10 Essential (primary) hypertension: Secondary | ICD-10-CM

## 2015-04-05 DIAGNOSIS — F419 Anxiety disorder, unspecified: Secondary | ICD-10-CM | POA: Insufficient documentation

## 2015-04-05 DIAGNOSIS — Z79899 Other long term (current) drug therapy: Secondary | ICD-10-CM | POA: Insufficient documentation

## 2015-04-05 NOTE — Progress Notes (Signed)
S:    Patient arrives in good spirits.   Presents to the clinic for hypertension evaluation. Patient was referred on 03/12/15 by Dr. Armen PickupFunches.  Patient was last seen by Primary Care Provider on 03/12/15.   Patient reports adherence with medications.   Current BP Medications include:  Amlodipine 10 mg daily and HCTZ 25 mg daily.  Patient reports that he continues to have anxiety. He started the clonazepam on Sunday. He thinks it is helping but isn't sure yet since it has only been a few days. Denies any SI/HI.   Patient reports redness in his left eye which he thinks is from one of his medications. He used to use Systane eye drops but is unable to afford them now. He has had cataract surgery in the past.  He reports that he drinks water throughout the day and will also drink Gatorade when it is hot outside.   O:   Last 3 Office BP readings: BP Readings from Last 3 Encounters:  04/05/15 141/90  03/11/15 159/96  12/31/14 134/86    BMET    Component Value Date/Time   NA 136 09/30/2014 1242   K 4.1 09/30/2014 1242   CL 89* 09/30/2014 1242   CO2 29 09/30/2014 1242   GLUCOSE 94 09/30/2014 1242   BUN 11 09/30/2014 1242   CREATININE 1.01 09/30/2014 1242   CREATININE 0.90 09/17/2014 2016   CALCIUM 10.5* 09/30/2014 1242   GFRNONAA >90 06/29/2013 1418   GFRAA >90 06/29/2013 1418    A/P: Hypertension currently slightly UNcontrolled on current medications but I think this is due to continued anxiety.  Continued amlodipine 10 mg daily and HCTZ 25 mg daily. Patient just restarted clonazepam on Sunday so I want to give him more time on it to see if it helps with sleep and anxiety as I think treating these will get his blood pressure to goal. Patient to follow up with Dr. Armen PickupFunches for further management of anxiety as well as his dry eye.  Results reviewed and written information provided.   Total time in face-to-face counseling 20 minutes.  F/U Clinic Visit with Dr. Armen PickupFunches.  Patient seen with  Theresa MulliganMartin Shaughnessy, PharmD Candidate.

## 2015-04-05 NOTE — Patient Instructions (Signed)
Thanks for coming to see me!  Continue to take all of your medications  Come back and see Dr. Armen PickupFunches in 2 weeks.

## 2015-04-11 MED FILL — HYDROCHLOROTHIAZIDE 25 MG T: 25 | 30 days supply | Qty: 30 | Fill #6

## 2015-04-11 MED FILL — AMLODIPINE BESYLATE 10 MG T: 10 | 30 days supply | Qty: 30 | Fill #6

## 2015-04-15 MED FILL — ATORVASTATIN 40 MG TABLET: 40 | 30 days supply | Qty: 30 | Fill #1

## 2015-04-20 MED FILL — busPIRone HCL 7.5 MG TABS: 7.5 | 30 days supply | Qty: 60 | Fill #1

## 2015-04-20 MED FILL — traZODone HCL 100 MG TABS: 100 | 30 days supply | Qty: 30 | Fill #1

## 2015-04-26 ENCOUNTER — Encounter: Payer: Self-pay | Admitting: Family Medicine

## 2015-04-26 ENCOUNTER — Ambulatory Visit: Payer: No Typology Code available for payment source | Attending: Family Medicine | Admitting: Family Medicine

## 2015-04-26 VITALS — BP 127/82 | HR 61 | Temp 97.7°F | Resp 16 | Ht 68.0 in | Wt 158.0 lb

## 2015-04-26 DIAGNOSIS — M545 Low back pain, unspecified: Secondary | ICD-10-CM

## 2015-04-26 DIAGNOSIS — I10 Essential (primary) hypertension: Secondary | ICD-10-CM | POA: Insufficient documentation

## 2015-04-26 DIAGNOSIS — H1012 Acute atopic conjunctivitis, left eye: Secondary | ICD-10-CM | POA: Insufficient documentation

## 2015-04-26 DIAGNOSIS — F17210 Nicotine dependence, cigarettes, uncomplicated: Secondary | ICD-10-CM | POA: Insufficient documentation

## 2015-04-26 DIAGNOSIS — J309 Allergic rhinitis, unspecified: Secondary | ICD-10-CM

## 2015-04-26 DIAGNOSIS — Z79899 Other long term (current) drug therapy: Secondary | ICD-10-CM | POA: Insufficient documentation

## 2015-04-26 DIAGNOSIS — G8929 Other chronic pain: Secondary | ICD-10-CM | POA: Insufficient documentation

## 2015-04-26 MED ORDER — KETOTIFEN FUMARATE 0.025 % OP SOLN
1.0000 [drp] | Freq: Two times a day (BID) | OPHTHALMIC | Status: DC
Start: 2015-04-26 — End: 2015-09-19

## 2015-04-26 MED ORDER — ACETAMINOPHEN-CODEINE #3 300-30 MG PO TABS: 1.0000 | ORAL_TABLET | Freq: Three times a day (TID) | ORAL | Status: DC | PRN

## 2015-04-26 MED ORDER — MONTELUKAST SODIUM 10 MG PO TABS
10.0000 mg | ORAL_TABLET | Freq: Every day | ORAL | Status: DC
Start: 2015-04-26 — End: 2015-09-19

## 2015-04-26 MED FILL — MONTELUKAST SOD 10 MG TAB: 10 | 30 days supply | Qty: 30 | Fill #0

## 2015-04-26 MED FILL — ACETAMINOPHEN/COD #3 TABLET: 300-30 | 20 days supply | Qty: 60 | Fill #0

## 2015-04-26 MED FILL — SM EYE ITCH RELIEF 0.025% D: 0.025 | 10 days supply | Qty: 10 | Fill #0

## 2015-04-26 NOTE — Assessment & Plan Note (Signed)
A: Blood pressure at goal. Meds: compliant  P: I will continue the patient's current medication regimen since his blood pressure is at goal.  

## 2015-04-26 NOTE — Progress Notes (Signed)
F/U HTN Stated lt eye been red since staring new  Medication, Trazadone  Taking medication as prescribed  Pain scale #5 - back pain  Tobacco user 1/2 ppday  No suicidal thoughts in the past two weeks

## 2015-04-26 NOTE — Progress Notes (Signed)
Subjective:  Patient ID: Jared Reyes, male    DOB: May 26, 1955  Age: 60 y.o. MRN: [id number]  CC: Hypertension   HPI Jared Reyes presents for   1. CHRONIC HYPERTENSION  Disease Monitoring  Blood pressure range: not checking   Chest pain: no   Dyspnea: no   Claudication: no   Medication compliance: yes  Medication Side Effects  Lightheadedness: no   Urinary frequency: no   Edema: no   2. L eye red: x 3 weeks. Comes and goes. There is eye lid swelling at times and mild amount of discharge. No trauma to eye. He has hx of hives, rash. He has tried [employer] adviser, zyrtec, claritin without improvement.   Social History  Substance Use Topics  . Smoking status: Current Every Day Smoker -- 1.00 packs/day    Types: Cigarettes  . Smokeless tobacco: Never Used  . Alcohol Use: 25.2 oz/week    42 Cans of beer per week     Comment: drinks beer daily    Outpatient Prescriptions Prior to Visit  Medication Sig Dispense Refill  . acetaminophen-codeine (TYLENOL #3) 300-30 MG tablet Take 1 tablet by mouth every 8 (eight) hours as needed for moderate pain. 60 tablet 2  . amLODipine (NORVASC) 10 MG tablet Take 1 tablet (10 mg total) by mouth daily. 30 tablet 11  . atorvastatin (LIPITOR) 40 MG tablet Take 1 tablet (40 mg total) by mouth daily. 90 tablet 3  . busPIRone (BUSPAR) 7.5 MG tablet Take 1 tablet (7.5 mg total) by mouth 2 (two) times daily. 60 tablet 2  . clonazePAM (KLONOPIN) 0.5 MG tablet Take 1 tablet (0.5 mg total) by mouth at bedtime. 30 tablet 2  . hydrochlorothiazide (HYDRODIURIL) 25 MG tablet Take 1 tablet (25 mg total) by mouth daily. 30 tablet 11  . levETIRAcetam (KEPPRA XR) 500 MG 24 hr tablet Take 2 tablets (1,000 mg total) by mouth daily. 60 tablet 2  . traZODone (DESYREL) 100 MG tablet Take 0.5-1 tablets (50-100 mg total) by mouth at bedtime. 30 tablet 2   No facility-administered medications prior to visit.    ROS Review of Systems  Constitutional: Negative for  fever, chills, fatigue and unexpected weight change.  Eyes: Positive for redness and itching. Negative for photophobia, pain, discharge and visual disturbance.  Respiratory: Negative for cough and shortness of breath.   Cardiovascular: Negative for chest pain, palpitations and leg swelling.  Gastrointestinal: Negative for nausea, vomiting, abdominal pain, diarrhea, constipation and blood in stool.  Endocrine: Negative for polydipsia, polyphagia and polyuria.  Musculoskeletal: Positive for back pain (comes and goes ). Negative for myalgias, arthralgias, gait problem and neck pain.  Skin: Negative for rash.  Allergic/Immunologic: Negative for immunocompromised state.  Hematological: Negative for adenopathy. Does not bruise/bleed easily.  Psychiatric/Behavioral: Negative for suicidal ideas, sleep disturbance and dysphoric mood. The patient is not nervous/anxious.     Objective:  BP 127/82 mmHg  Pulse 61  Temp(Src) 97.7 F (36.5 C) (Oral)  Resp 16  Ht  (1.727 m)  Wt 158 lb (71.668 kg)  BMI 24.03 kg/m2  SpO2 100%  BP/Weight 04/26/2015 04/05/2015 03/11/2015  Systolic BP 127 141 159  Diastolic BP 82 90 96  Wt. (Lbs) 158 - 162  BMI 24.03 - 24.64   Physical Exam  Constitutional: He appears well-developed and well-nourished. No distress.  HENT:  Head: Normocephalic and atraumatic.  Eyes: EOM and lids are normal. Pupils are equal, round, and reactive to light. Left conjunctiva is injected.  Left conjunctiva has no hemorrhage. Pupils are equal.  Neck: Normal range of motion. Neck supple.  Cardiovascular: Normal rate, regular rhythm, normal heart sounds and intact distal pulses.   Pulmonary/Chest: Effort normal and breath sounds normal.  Musculoskeletal: He exhibits no edema.  Neurological: He is alert.  Skin: Skin is warm and dry. No rash noted. No erythema.  Psychiatric: He has a normal mood and affect.    Assessment & Plan:   There are no diagnoses linked to this  encounter. Egbert was seen today for hypertension.  Diagnoses and all orders for this visit:  Chronic low back pain -     acetaminophen-codeine (TYLENOL #3) 300-30 MG tablet; Take 1 tablet by mouth every 8 (eight) hours as needed for moderate pain.  Allergic conjunctivitis of left eye -     ketotifen (ZADITOR) 0.025 % ophthalmic solution; Place 1 drop into both eyes 2 (two) times daily.  Allergic rhinoconjunctivitis of left eye -     montelukast (SINGULAIR) 10 MG tablet; Take 1 tablet (10 mg total) by mouth at bedtime.   Meds ordered this encounter  Medications  . acetaminophen-codeine (TYLENOL #3) 300-30 MG tablet    Sig: Take 1 tablet by mouth every 8 (eight) hours as needed for moderate pain.    Dispense:  60 tablet    Refill:  2  . ketotifen (ZADITOR) 0.025 % ophthalmic solution    Sig: Place 1 drop into both eyes 2 (two) times daily.    Dispense:  10 mL    Refill:  1  . montelukast (SINGULAIR) 10 MG tablet    Sig: Take 1 tablet (10 mg total) by mouth at bedtime.    Dispense:  30 tablet    Refill:  2    Follow-up: No Follow-up on file.   Dessa PhiJosalyn Rorey Bisson MD

## 2015-04-26 NOTE — Patient Instructions (Addendum)
Dameian was seen today for hypertension.  Diagnoses and all orders for this visit:  Chronic low back pain -     acetaminophen-codeine (TYLENOL #3) 300-30 MG tablet; Take 1 tablet by mouth every 8 (eight) hours as needed for moderate pain.  Allergic conjunctivitis of left eye -     ketotifen (ZADITOR) 0.025 % ophthalmic solution; Place 1 drop into both eyes 2 (two) times daily.  Allergic rhinoconjunctivitis of left eye -     montelukast (SINGULAIR) 10 MG tablet; Take 1 tablet (10 mg total) by mouth at bedtime.   Great job with your blood pressure  F/u in 3 months for HTN, call and come in sooner if red eye does not improve  Dr. Armen PickupFunches

## 2015-04-26 NOTE — Assessment & Plan Note (Signed)
A: allergic conjunctivitis L eye P: Zaditor eye solution  Singulair

## 2015-05-03 ENCOUNTER — Other Ambulatory Visit: Payer: Self-pay | Admitting: Family Medicine

## 2015-05-03 MED FILL — LEVETIRACETAM ER 500 MG TAB: 500 | 30 days supply | Qty: 60 | Fill #0

## 2015-05-09 MED FILL — HYDROCHLOROTHIAZIDE 25 MG T: 25 | 30 days supply | Qty: 30 | Fill #7

## 2015-05-09 MED FILL — AMLODIPINE BESYLATE 10 MG T: 10 | 30 days supply | Qty: 30 | Fill #7

## 2015-06-01 MED FILL — MONTELUKAST SOD 10 MG TAB: 10 | 30 days supply | Qty: 30 | Fill #1

## 2015-06-01 MED FILL — busPIRone HCL 7.5 MG TABS: 7.5 | 30 days supply | Qty: 60 | Fill #2

## 2015-06-01 MED FILL — ATORVASTATIN 40 MG TABLET: 40 | 30 days supply | Qty: 30 | Fill #2

## 2015-06-01 MED FILL — traZODone HCL 100 MG TABS: 100 | 30 days supply | Qty: 30 | Fill #2

## 2015-06-01 MED FILL — ACETAMINOPHEN/COD #3 TABLET: 300-30 | 20 days supply | Qty: 60 | Fill #1

## 2015-06-14 MED FILL — AMLODIPINE BESYLATE 10 MG T: 10 | 30 days supply | Qty: 30 | Fill #8

## 2015-06-14 MED FILL — LEVETIRACETAM ER 500 MG TAB: 500 | 30 days supply | Qty: 60 | Fill #1

## 2015-06-14 MED FILL — ?HYDROCHLOROTHIAZIDE 25 MG: 25 MG | 30 days supply | Qty: 30 | Fill #8

## 2015-07-08 MED FILL — ACETAMINOPHEN/COD #3 TABLET: 300-30 | 20 days supply | Qty: 60 | Fill #2

## 2015-07-08 MED FILL — ATORVASTATIN 40 MG TABLET: 40 | 30 days supply | Qty: 30 | Fill #3

## 2015-07-14 ENCOUNTER — Ambulatory Visit: Payer: Self-pay | Admitting: Family Medicine

## 2015-07-26 ENCOUNTER — Encounter: Payer: Self-pay | Admitting: Family Medicine

## 2015-07-26 ENCOUNTER — Ambulatory Visit: Payer: No Typology Code available for payment source | Attending: Family Medicine | Admitting: Family Medicine

## 2015-07-26 VITALS — BP 149/92 | HR 118 | Temp 98.6°F | Resp 16 | Ht 68.0 in | Wt 153.0 lb

## 2015-07-26 DIAGNOSIS — Z9841 Cataract extraction status, right eye: Secondary | ICD-10-CM

## 2015-07-26 DIAGNOSIS — Z7289 Other problems related to lifestyle: Secondary | ICD-10-CM

## 2015-07-26 DIAGNOSIS — S025XXA Fracture of tooth (traumatic), initial encounter for closed fracture: Secondary | ICD-10-CM

## 2015-07-26 DIAGNOSIS — I10 Essential (primary) hypertension: Secondary | ICD-10-CM

## 2015-07-26 DIAGNOSIS — Z114 Encounter for screening for human immunodeficiency virus [HIV]: Secondary | ICD-10-CM

## 2015-07-26 DIAGNOSIS — M545 Low back pain: Secondary | ICD-10-CM

## 2015-07-26 DIAGNOSIS — Z9842 Cataract extraction status, left eye: Secondary | ICD-10-CM

## 2015-07-26 DIAGNOSIS — G4700 Insomnia, unspecified: Secondary | ICD-10-CM

## 2015-07-26 DIAGNOSIS — Z1159 Encounter for screening for other viral diseases: Secondary | ICD-10-CM

## 2015-07-26 DIAGNOSIS — Z789 Other specified health status: Secondary | ICD-10-CM

## 2015-07-26 DIAGNOSIS — G8929 Other chronic pain: Secondary | ICD-10-CM

## 2015-07-26 MED ORDER — AMLODIPINE BESYLATE 10 MG PO TABS: 10.0000 mg | ORAL_TABLET | Freq: Every day | ORAL | Status: DC

## 2015-07-26 MED ORDER — ACETAMINOPHEN-CODEINE #3 300-30 MG PO TABS: 1.0000 | ORAL_TABLET | Freq: Three times a day (TID) | ORAL | Status: DC | PRN

## 2015-07-26 MED ORDER — TRAZODONE HCL 100 MG PO TABS: 50.0000 mg | ORAL_TABLET | Freq: Every day | ORAL | Status: DC

## 2015-07-26 MED ORDER — HYDROCHLOROTHIAZIDE 25 MG PO TABS: 25.0000 mg | ORAL_TABLET | Freq: Every day | ORAL | Status: DC

## 2015-07-26 MED FILL — ?TRAZODONE 100 MG TABLET: 100 | 30 days supply | Qty: 30 | Fill #0

## 2015-07-26 MED FILL — AMLODIPINE BESYLATE 10 MG T: 10 | 30 days supply | Qty: 30 | Fill #0

## 2015-07-26 MED FILL — HYDROCHLOROTHIAZIDE 25 MG T: 25 | 30 days supply | Qty: 30 | Fill #0

## 2015-07-26 NOTE — Patient Instructions (Addendum)
Jared Reyes was seen today for hypertension and back pain.  Diagnoses and all orders for this visit:  Essential hypertension -     hydrochlorothiazide (HYDRODIURIL) 25 MG tablet; Take 1 tablet (25 mg total) by mouth daily. -     amLODipine (NORVASC) 10 MG tablet; Take 1 tablet (10 mg total) by mouth daily. -     COMPLETE METABOLIC PANEL WITH GFR  History of bilateral cataract extraction  Chronic low back pain -     acetaminophen-codeine (TYLENOL #3) 300-30 MG tablet; Take 1 tablet by mouth every 8 (eight) hours as needed for moderate pain.  Need for hepatitis C screening test -     Hepatitis C antibody, reflex  Screening for HIV (human immunodeficiency virus) -     HIV antibody (with reflex)  Alcohol use (HCC) -     CBC  Insomnia -     traZODone (DESYREL) 100 MG tablet; Take 0.5-1 tablets (50-100 mg total) by mouth at bedtime.  Chipped tooth, closed, initial encounter -     Ambulatory referral to Dentistry   I will complete your DMV physical form and it will be faxed from this office   F/u in 4 weeks for HTN  Dr. Armen PickupFunches

## 2015-07-26 NOTE — Assessment & Plan Note (Signed)
Hx of ETOH abuse and ETOH withdrawal seizures Seizure free since 08/2014  Plan: Continue keppra Completed DMv medical review form

## 2015-07-26 NOTE — Progress Notes (Signed)
Subjective:  Patient ID: Jared Reyes, male    DOB: [birth year]  Age: 60 y.o. MRN: [id number]  CC: Hypertension and Back Pain   HPI Jared CoinsGilmer C Scheibel presents for    1. HTN: he is non compliant with norvasc and HCTZ for past 2 weeks due to not having his medications. He admits to some headache on the L side which he attributes to a chipped tooth. He denies CP, SOB and leg swelling.   2. Back pain: this is chronic he continues to take tylenol #3 which controls his pain. No recent injury or falls.   3. DMV medical review form: he has hx of ETOH abuse and ETOH withdrawal seizures. [facility] for alcohol withdrawal.  His first seizures was in 2009. He last seizure was in 08/2014. He has been compliant with keppra since 08/2014. He drinks 1-2 beers once weekly. He does not drink and drive. He denies use of illegal substances. He reports going to AA 1 time per week. He does not have a sponsor.   Social History  Substance Use Topics  . Smoking status: Current Every Day Smoker -- 1.00 packs/day    Types: Cigarettes  . Smokeless tobacco: Never Used  . Alcohol Use: 25.2 oz/week    42 Cans of beer per week     Comment: drinks beer daily    Outpatient Prescriptions Prior to Visit  Medication Sig Dispense Refill  . acetaminophen-codeine (TYLENOL #3) 300-30 MG tablet Take 1 tablet by mouth every 8 (eight) hours as needed for moderate pain. 60 tablet 2  . amLODipine (NORVASC) 10 MG tablet Take 1 tablet (10 mg total) by mouth daily. 30 tablet 11  . atorvastatin (LIPITOR) 40 MG tablet Take 1 tablet (40 mg total) by mouth daily. 90 tablet 3  . busPIRone (BUSPAR) 7.5 MG tablet Take 1 tablet (7.5 mg total) by mouth 2 (two) times daily. 60 tablet 2  . clonazePAM (KLONOPIN) 0.5 MG tablet Take 1 tablet (0.5 mg total) by mouth at bedtime. 30 tablet 2  . hydrochlorothiazide (HYDRODIURIL) 25 MG tablet Take 1 tablet (25 mg total) by mouth daily. 30 tablet 11  .  ketotifen (ZADITOR) 0.025 % ophthalmic solution Place 1 drop into both eyes 2 (two) times daily. 10 mL 1  . levETIRAcetam (KEPPRA XR) 500 MG 24 hr tablet TAKE 2 TABLETS BY MOUTH DAILY. 60 tablet 2  . montelukast (SINGULAIR) 10 MG tablet Take 1 tablet (10 mg total) by mouth at bedtime. 30 tablet 2  . traZODone (DESYREL) 100 MG tablet Take 0.5-1 tablets (50-100 mg total) by mouth at bedtime. 30 tablet 2   No facility-administered medications prior to visit.    ROS Review of Systems  Constitutional: Negative for fever, chills, fatigue and unexpected weight change.  HENT: Positive for dental problem.   Eyes: Positive for redness and itching. Negative for photophobia, pain, discharge and visual disturbance.  Respiratory: Negative for cough and shortness of breath.   Cardiovascular: Negative for chest pain, palpitations and leg swelling.  Gastrointestinal: Negative for nausea, vomiting, abdominal pain, diarrhea, constipation and blood in stool.  Endocrine: Negative for polydipsia, polyphagia and polyuria.  Musculoskeletal: Positive for back pain (comes and goes ). Negative for myalgias, arthralgias, gait problem and neck pain.  Skin: Negative for rash.  Allergic/Immunologic: Negative for immunocompromised state.  Neurological: Positive for headaches.  Hematological: Negative for adenopathy. Does not bruise/bleed easily.  Psychiatric/Behavioral: Negative for suicidal ideas, sleep disturbance and  dysphoric mood. The patient is not nervous/anxious.     Objective:  BP 149/92 mmHg  Pulse 118  Temp(Src) 98.6 F (37 C) (Oral)  Resp 16  Ht  (1.727 m)  Wt 153 lb (69.4 kg)  BMI 23.27 kg/m2  SpO2 94%  BP/Weight 07/26/2015 04/26/2015 04/05/2015  Systolic BP 149 127 141  Diastolic BP 92 82 90  Wt. (Lbs) 153 158 -  BMI 23.27 24.03 -    Physical Exam  Constitutional: He appears well-developed and well-nourished. No distress.  HENT:  Head: Normocephalic and atraumatic.  Mouth/Throat:  Abnormal dentition. Dental caries present. No dental abscesses.  Eyes: EOM and lids are normal. Pupils are equal, round, and reactive to light. Left conjunctiva is injected. Left conjunctiva has no hemorrhage. Pupils are equal.  Neck: Normal range of motion. Neck supple.  Cardiovascular: Normal rate, regular rhythm, normal heart sounds and intact distal pulses.   Pulmonary/Chest: Effort normal and breath sounds normal.  Musculoskeletal: He exhibits no edema.  Neurological: He is alert.  Skin: Skin is warm and dry. No rash noted. No erythema.  Psychiatric: He has a normal mood and affect.   Depression screen [facility] 2/9 07/26/2015 03/11/2015 12/31/2014 12/03/2014 10/22/2014  Decreased Interest 0 0 3 0 1  Down, Depressed, Hopeless 0 2 2 0 3  PHQ - 2 Score 0 2 5 0 4  Altered sleeping - 2  Tired, decreased energy - 3  Change in appetite - 0  Feeling bad or failure about yourself  0 1 2 - 0  Trouble concentrating 0 0 0 - 0  Moving slowly or fidgety/restless 0 0 0 - 0  Suicidal thoughts 0 0 0 - 0  PHQ-9 Score - 9    GAD 7 : Generalized Anxiety Score 07/26/2015 03/11/2015 12/31/2014  Nervous, Anxious, on Edge Control/stop worrying Worry too much - different things Trouble relaxing 2 0 0  Restless 1 0 3  Easily annoyed or irritable 1 0 0  Afraid - awful might happen 0 0 0  Total GAD 7 Score Assessment & Plan:   There are no diagnoses linked to this encounter. Graydon was seen today for hypertension and back pain.  Diagnoses and all orders for this visit:  Essential hypertension -     hydrochlorothiazide (HYDRODIURIL) 25 MG tablet; Take 1 tablet (25 mg total) by mouth daily. -     amLODipine (NORVASC) 10 MG tablet; Take 1 tablet (10 mg total) by mouth daily. -     COMPLETE METABOLIC PANEL WITH GFR  History of bilateral cataract extraction  Chronic low back pain -     acetaminophen-codeine (TYLENOL #3) 300-30 MG tablet; Take 1  tablet by mouth every 8 (eight) hours as needed for moderate pain.  Need for hepatitis C screening test -     Hepatitis C antibody, reflex  Screening for HIV (human immunodeficiency virus) -     HIV antibody (with reflex)  Alcohol use (HCC) -     CBC  Insomnia -     traZODone (DESYREL) 100 MG tablet; Take 0.5-1 tablets (50-100 mg total) by mouth at bedtime.  Chipped tooth, closed, initial encounter -     Ambulatory referral to Dentistry   Meds ordered this encounter  Medications  . hydrochlorothiazide (HYDRODIURIL) 25 MG tablet  Sig: Take 1 tablet (25 mg total) by mouth daily.    Dispense:  90 tablet    Refill:  3  . amLODipine (NORVASC) 10 MG tablet    Sig: Take 1 tablet (10 mg total) by mouth daily.    Dispense:  90 tablet    Refill:  3  . acetaminophen-codeine (TYLENOL #3) 300-30 MG tablet    Sig: Take 1 tablet by mouth every 8 (eight) hours as needed for moderate pain.    Dispense:  60 tablet    Refill:  2  . traZODone (DESYREL) 100 MG tablet    Sig: Take 0.5-1 tablets (50-100 mg total) by mouth at bedtime.    Dispense:  30 tablet    Refill:  2    Follow-up: No Follow-up on file.   Dessa PhiJosalyn Mavin Dyke MD

## 2015-07-26 NOTE — Progress Notes (Signed)
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

## 2015-07-26 NOTE — Assessment & Plan Note (Signed)
Elevated BP due to med noncompliance Refilled norvasc and HCTZ

## 2015-07-27 LAB — COMPLETE METABOLIC PANEL WITH GFR
ALT: 86 U/L — AB (ref 9–46)
AST: 103 U/L — AB (ref 10–35)
Albumin: 4.5 g/dL (ref 3.6–5.1)
Alkaline Phosphatase: 88 U/L (ref 40–115)
BILIRUBIN TOTAL: 0.9 mg/dL (ref 0.2–1.2)
BUN: 7 mg/dL (ref 7–25)
CHLORIDE: 91 mmol/L — AB (ref 98–110)
CO2: 23 mmol/L (ref 20–31)
CREATININE: 0.77 mg/dL (ref 0.70–1.33)
Calcium: 8.8 mg/dL (ref 8.6–10.3)
GFR, Est Non African American: >89 mL/min (ref >=60)
Glucose, Bld: 111 mg/dL — ABNORMAL HIGH (ref 65–99)
Potassium: 3.2 mmol/L — ABNORMAL LOW (ref 3.5–5.3)
Sodium: 129 mmol/L — ABNORMAL LOW (ref 135–146)
TOTAL PROTEIN: 6.8 g/dL (ref 6.1–8.1)

## 2015-07-27 LAB — CBC
HCT: 40.9 % (ref 38.5–50.0)
Hemoglobin: 14.9 g/dL (ref 13.2–17.1)
MCH: 34.4 pg — ABNORMAL HIGH (ref 27.0–33.0)
MCHC: 36.4 g/dL — ABNORMAL HIGH (ref 32.0–36.0)
MCV: 94.5 fL (ref 80.0–100.0)
MPV: 9.9 fL (ref 7.5–12.5)
PLATELETS: 191 10*3/uL (ref 140–400)
RBC: 4.33 MIL/uL (ref 4.20–5.80)
RDW: 14.2 % (ref 11.0–15.0)
WBC: 7.2 10*3/uL (ref 3.8–10.8)

## 2015-07-28 LAB — HEPATITIS C ANTIBODY: HCV AB: NEGATIVE

## 2015-07-28 LAB — HIV ANTIBODY (ROUTINE TESTING W REFLEX): HIV 1&2 Ab, 4th Generation: NONREACTIVE

## 2015-07-29 ENCOUNTER — Other Ambulatory Visit: Payer: Self-pay | Admitting: Family Medicine

## 2015-07-29 ENCOUNTER — Telehealth: Payer: Self-pay | Admitting: Family Medicine

## 2015-07-29 MED ORDER — POTASSIUM CHLORIDE CRYS ER 10 MEQ PO TBCR: 10.0000 meq | EXTENDED_RELEASE_TABLET | Freq: Every day | ORAL | Status: DC

## 2015-07-29 NOTE — Telephone Encounter (Signed)
Called pt. Reached voicemail. Left message to return call at [phone]. 

## 2015-07-29 NOTE — Telephone Encounter (Signed)
-----   Message from Jaclyn ShaggyEnobong Amao, MD sent at 07/29/2015  5:15 PM EDT ----- Labs reveal elevated liver enzymes which is secondary to alcohol consumption and he is advised to work on quitting. Potassium level is low and I have sent a prescription for potassium replacement to the pharmacy. HIV, hepatitis C and negative.

## 2015-07-29 NOTE — Telephone Encounter (Signed)
Pt returning call

## 2015-08-03 MED FILL — POTASSIUM CL 10 MEQ TAB SA: 10 | 30 days supply | Qty: 30 | Fill #0

## 2015-08-03 MED FILL — ?ATORVASTATIN 40MG TABLET: 40 | 30 days supply | Qty: 30 | Fill #4

## 2015-08-03 MED FILL — LEVETIRACETAM ER 500 MG TAB: 500 | 30 days supply | Qty: 60 | Fill #0

## 2015-08-03 NOTE — Telephone Encounter (Signed)
Called pt. Pt verified name and date of birth. Pt notified that his labs reveal elevated liver enzymes which is secondary to alcohol consumption and he is advised to work on quitting. Pt also notified his potassium level is low and a rx for potassium replacement to the pharmacy on file. Pt notified he is negative for HIV and Hep C. Pt voiced understanding.

## 2015-08-22 ENCOUNTER — Ambulatory Visit: Payer: Self-pay | Admitting: Family Medicine

## 2015-09-02 MED FILL — AMLODIPINE BESYLATE 10 MG T: 10 | 30 days supply | Qty: 30 | Fill #1

## 2015-09-02 MED FILL — HYDROCHLOROTHIAZIDE 25 MG T: 25 | 30 days supply | Qty: 30 | Fill #1

## 2015-09-07 MED FILL — ACETAMINOPHEN/COD #3 TABLET: 300-30 | 20 days supply | Qty: 60 | Fill #0

## 2015-09-16 ENCOUNTER — Encounter: Payer: Self-pay | Admitting: Family Medicine

## 2015-09-16 ENCOUNTER — Ambulatory Visit: Payer: No Typology Code available for payment source | Attending: Family Medicine | Admitting: Family Medicine

## 2015-09-16 VITALS — BP 124/85 | HR 122 | Temp 99.2°F | Ht 68.0 in | Wt 151.4 lb

## 2015-09-16 DIAGNOSIS — S61451A Open bite of right hand, initial encounter: Secondary | ICD-10-CM

## 2015-09-16 DIAGNOSIS — Z79899 Other long term (current) drug therapy: Secondary | ICD-10-CM | POA: Insufficient documentation

## 2015-09-16 DIAGNOSIS — S6991XA Unspecified injury of right wrist, hand and finger(s), initial encounter: Secondary | ICD-10-CM

## 2015-09-16 DIAGNOSIS — S60871A Other superficial bite of right wrist, initial encounter: Secondary | ICD-10-CM | POA: Insufficient documentation

## 2015-09-16 DIAGNOSIS — I10 Essential (primary) hypertension: Secondary | ICD-10-CM | POA: Insufficient documentation

## 2015-09-16 DIAGNOSIS — S59911A Unspecified injury of right forearm, initial encounter: Secondary | ICD-10-CM | POA: Insufficient documentation

## 2015-09-16 DIAGNOSIS — F17210 Nicotine dependence, cigarettes, uncomplicated: Secondary | ICD-10-CM | POA: Insufficient documentation

## 2015-09-16 DIAGNOSIS — W5501XA Bitten by cat, initial encounter: Secondary | ICD-10-CM | POA: Insufficient documentation

## 2015-09-16 MED ORDER — AMOXICILLIN-POT CLAVULANATE 875-125 MG PO TABS: 1.0000 | ORAL_TABLET | Freq: Two times a day (BID) | ORAL | 0 refills | Status: DC

## 2015-09-16 MED FILL — AMOX-CLAV 875-125 MG TABLET: 875-125 | 10 days supply | Qty: 20 | Fill #0

## 2015-09-16 NOTE — Progress Notes (Signed)
Subjective:  Patient ID: Jared Reyes, male    DOB: [birth year]  Age: 60 y.o. MRN: [id number]  CC: Hypertension   HPI Jared CoinsGilmer C Slutsky presents for    1. CHRONIC HYPERTENSION: compliant with norvasc and HCTZ. He has reduced ETOH and smoking. He denies HA, CP, SOB.    2. R wrist swelling: patient reports cat bite on palm 3 days ago. 2 days ago he had a fall and braced himself with his R hand. He is R handed. He has swelling, pain and redness in R hand to R forearm. With maximum pain in R thenar eminence and R forearm. He denies fever and chills. Cat bite wound has healed. His last Tdap was 02/19/2014.   Social History  Substance Use Topics  . Smoking status: Current Every Day Smoker    Packs/day: 1.00    Types: Cigarettes  . Smokeless tobacco: Never Used  . Alcohol use 25.2 oz/week    42 Cans of beer per week     Comment: drinks beer daily   Outpatient Medications Prior to Visit  Medication Sig Dispense Refill  . acetaminophen-codeine (TYLENOL #3) 300-30 MG tablet Take 1 tablet by mouth every 8 (eight) hours as needed for moderate pain. 60 tablet 2  . amLODipine (NORVASC) 10 MG tablet Take 1 tablet (10 mg total) by mouth daily. 90 tablet 3  . atorvastatin (LIPITOR) 40 MG tablet Take 1 tablet (40 mg total) by mouth daily. 90 tablet 3  . hydrochlorothiazide (HYDRODIURIL) 25 MG tablet Take 1 tablet (25 mg total) by mouth daily. 90 tablet 3  . levETIRAcetam (KEPPRA XR) 500 MG 24 hr tablet TAKE 2 TABLETS BY MOUTH DAILY. 60 tablet 2  . potassium chloride SA (K-DUR,KLOR-CON) 10 MEQ tablet Take 1 tablet (10 mEq total) by mouth daily. 30 tablet 0  . traZODone (DESYREL) 100 MG tablet Take 0.5-1 tablets (50-100 mg total) by mouth at bedtime. 30 tablet 2  . ketotifen (ZADITOR) 0.025 % ophthalmic solution Place 1 drop into both eyes 2 (two) times daily. (Patient not taking: Reported on 09/16/2015) 10 mL 1  . montelukast (SINGULAIR) 10 MG tablet Take 1 tablet (10 mg total) by mouth at bedtime.  (Patient not taking: Reported on 09/16/2015) 30 tablet 2   No facility-administered medications prior to visit.     ROS Review of Systems  Constitutional: Negative for chills, fatigue, fever and unexpected weight change.  HENT: Positive for dental problem.   Eyes: Positive for redness and itching. Negative for photophobia, pain, discharge and visual disturbance.  Respiratory: Negative for cough and shortness of breath.   Cardiovascular: Negative for chest pain, palpitations and leg swelling.  Gastrointestinal: Negative for abdominal pain, blood in stool, constipation, diarrhea, nausea and vomiting.  Endocrine: Negative for polydipsia, polyphagia and polyuria.  Musculoskeletal: Positive for back pain (comes and goes ). Negative for arthralgias, gait problem, myalgias and neck pain.  Skin: Negative for rash.  Allergic/Immunologic: Negative for immunocompromised state.  Neurological: Positive for headaches.  Hematological: Negative for adenopathy. Does not bruise/bleed easily.  Psychiatric/Behavioral: Negative for dysphoric mood, sleep disturbance and suicidal ideas. The patient is not nervous/anxious.     Objective:  BP 124/85 (BP Location: Left Arm, Patient Position: Sitting, Cuff Size: Small)   Pulse (!) 122   Temp 99.2 F (37.3 C) (Oral)   Ht 5\' 8"  (1.727 m)   Wt 151 lb 6.4 oz (68.7 kg)   SpO2 97%   BMI 23.02 kg/m   BP/Weight 09/16/2015 07/26/2015 04/26/2015  Systolic BP 124 149 127  Diastolic BP 85 92 82  Wt. (Lbs) 151.4 153 158  BMI 23.02 23.27 24.03  Some encounter information is confidential and restricted. Go to Review Flowsheets activity to see all data.   Temp Readings from Last 3 Encounters:  09/16/15 99.2 F (37.3 C) (Oral)  07/26/15 98.6 F (37 C) (Oral)  04/26/15 97.7 F (36.5 C) (Oral)    Physical Exam  Constitutional: He appears well-developed and well-nourished. No distress.  HENT:  Head: Normocephalic and atraumatic.  Mouth/Throat: Abnormal dentition.  Dental caries present. No dental abscesses.  Eyes: EOM and lids are normal. Pupils are equal, round, and reactive to light. Left conjunctiva is injected. Left conjunctiva has no hemorrhage. Pupils are equal.  Neck: Normal range of motion. Neck supple.  Cardiovascular: Normal rate, regular rhythm, normal heart sounds and intact distal pulses.   Pulmonary/Chest: Effort normal and breath sounds normal.  Musculoskeletal: He exhibits no edema.       Arms: Lymphadenopathy:       Right axillary: No pectoral and no lateral adenopathy present.  Neurological: He is alert.  Skin: Skin is warm and dry. No rash noted. No erythema.  Psychiatric: He has a normal mood and affect.     Assessment & Plan:  Benay PikeGilmer was seen today for hypertension.  Diagnoses and all orders for this visit:  Right wrist injury, initial encounter -     DG Hand 2 View Right; Future -     DG Wrist Complete Right; Future -     DG Forearm Right; Future  Right forearm injury, initial encounter -     DG Hand 2 View Right; Future -     DG Wrist Complete Right; Future -     DG Forearm Right; Future  Cat bite of right hand, initial encounter -     amoxicillin-clavulanate (AUGMENTIN) 875-125 MG tablet; Take 1 tablet by mouth 2 (two) times daily.   There are no diagnoses linked to this encounter.  No orders of the defined types were placed in this encounter.   Follow-up: No Follow-up on file.   Dessa PhiJosalyn Noga Fogg MD

## 2015-09-16 NOTE — Progress Notes (Signed)
C/C: patients right wrist is swollen.

## 2015-09-16 NOTE — Patient Instructions (Addendum)
Jared Reyes was seen today for hypertension.  Diagnoses and all orders for this visit:  Right wrist injury, initial encounter -     DG Hand 2 View Right; Future -     DG Wrist Complete Right; Future -     DG Forearm Right; Future  Right forearm injury, initial encounter -     DG Hand 2 View Right; Future -     DG Wrist Complete Right; Future -     DG Forearm Right; Future  Cat bite of right hand, initial encounter -     amoxicillin-clavulanate (AUGMENTIN) 875-125 MG tablet; Take 1 tablet by mouth 2 (two) times daily.  elevate and ice wrist at rest [facility]    F/u in 2 weeks to recheck R wrist sprain and mild cellulitis   Dr. Armen PickupFunches

## 2015-09-19 ENCOUNTER — Inpatient Hospital Stay (HOSPITAL_COMMUNITY)
Admission: EM | Admit: 2015-09-19 | Discharge: 2015-09-21 | DRG: 603 | Discharge status: Against Medical Advice | Payer: No Typology Code available for payment source | Attending: Internal Medicine | Admitting: Internal Medicine

## 2015-09-19 ENCOUNTER — Emergency Department (HOSPITAL_COMMUNITY): Payer: No Typology Code available for payment source

## 2015-09-19 ENCOUNTER — Encounter (HOSPITAL_COMMUNITY): Payer: Self-pay | Admitting: Nurse Practitioner

## 2015-09-19 DIAGNOSIS — L03113 Cellulitis of right upper limb: Principal | ICD-10-CM | POA: Diagnosis present

## 2015-09-19 DIAGNOSIS — Z8249 Family history of ischemic heart disease and other diseases of the circulatory system: Secondary | ICD-10-CM

## 2015-09-19 DIAGNOSIS — E871 Hypo-osmolality and hyponatremia: Secondary | ICD-10-CM | POA: Diagnosis present

## 2015-09-19 DIAGNOSIS — W5501XA Bitten by cat, initial encounter: Secondary | ICD-10-CM

## 2015-09-19 DIAGNOSIS — E876 Hypokalemia: Secondary | ICD-10-CM | POA: Diagnosis present

## 2015-09-19 DIAGNOSIS — T502X5A Adverse effect of carbonic-anhydrase inhibitors, benzothiadiazides and other diuretics, initial encounter: Secondary | ICD-10-CM | POA: Diagnosis present

## 2015-09-19 DIAGNOSIS — I10 Essential (primary) hypertension: Secondary | ICD-10-CM | POA: Diagnosis present

## 2015-09-19 DIAGNOSIS — S59911A Unspecified injury of right forearm, initial encounter: Secondary | ICD-10-CM | POA: Diagnosis present

## 2015-09-19 DIAGNOSIS — Z789 Other specified health status: Secondary | ICD-10-CM | POA: Diagnosis present

## 2015-09-19 DIAGNOSIS — Z79899 Other long term (current) drug therapy: Secondary | ICD-10-CM

## 2015-09-19 DIAGNOSIS — G4700 Insomnia, unspecified: Secondary | ICD-10-CM | POA: Diagnosis present

## 2015-09-19 DIAGNOSIS — R569 Unspecified convulsions: Secondary | ICD-10-CM

## 2015-09-19 DIAGNOSIS — Z833 Family history of diabetes mellitus: Secondary | ICD-10-CM

## 2015-09-19 DIAGNOSIS — S6991XA Unspecified injury of right wrist, hand and finger(s), initial encounter: Secondary | ICD-10-CM | POA: Insufficient documentation

## 2015-09-19 DIAGNOSIS — F17210 Nicotine dependence, cigarettes, uncomplicated: Secondary | ICD-10-CM | POA: Diagnosis present

## 2015-09-19 DIAGNOSIS — F419 Anxiety disorder, unspecified: Secondary | ICD-10-CM | POA: Diagnosis present

## 2015-09-19 DIAGNOSIS — W19XXXA Unspecified fall, initial encounter: Secondary | ICD-10-CM | POA: Diagnosis present

## 2015-09-19 DIAGNOSIS — Z7289 Other problems related to lifestyle: Secondary | ICD-10-CM | POA: Diagnosis present

## 2015-09-19 DIAGNOSIS — S61451A Open bite of right hand, initial encounter: Secondary | ICD-10-CM | POA: Insufficient documentation

## 2015-09-19 HISTORY — DX: Cellulitis, unspecified: L03.90

## 2015-09-19 LAB — CBC WITH DIFFERENTIAL/PLATELET
Basophils Absolute: 0 10*3/uL (ref 0.0–0.1)
Basophils Relative: 0 %
Eosinophils Absolute: 0 10*3/uL (ref 0.0–0.7)
Eosinophils Relative: 0 %
HEMATOCRIT: 41.5 % (ref 39.0–52.0)
HEMOGLOBIN: 15.5 g/dL (ref 13.0–17.0)
LYMPHS ABS: 1 10*3/uL (ref 0.7–4.0)
LYMPHS PCT: 6 %
MCH: 35.6 pg — AB (ref 26.0–34.0)
MCHC: 37.3 g/dL — AB (ref 30.0–36.0)
MCV: 95.2 fL (ref 78.0–100.0)
MONO ABS: 2 10*3/uL — AB (ref 0.1–1.0)
MONOS PCT: 12 %
NEUTROS ABS: 13.6 10*3/uL — AB (ref 1.7–7.7)
NEUTROS PCT: 82 %
Platelets: 263 10*3/uL (ref 150–400)
RBC: 4.36 MIL/uL (ref 4.22–5.81)
RDW: 12.6 % (ref 11.5–15.5)
WBC: 16.6 10*3/uL — ABNORMAL HIGH (ref 4.0–10.5)

## 2015-09-19 LAB — COMPREHENSIVE METABOLIC PANEL
ALBUMIN: 3.3 g/dL — AB (ref 3.5–5.0)
ALT: 58 U/L (ref 17–63)
ANION GAP: 17 — AB (ref 5–15)
AST: 58 U/L — AB (ref 15–41)
Alkaline Phosphatase: 80 U/L (ref 38–126)
BILIRUBIN TOTAL: 2.2 mg/dL — AB (ref 0.3–1.2)
BUN: 10 mg/dL (ref 6–20)
CO2: 33 mmol/L — AB (ref 22–32)
Calcium: 8.4 mg/dL — ABNORMAL LOW (ref 8.9–10.3)
Chloride: 74 mmol/L — ABNORMAL LOW (ref 101–111)
Creatinine, Ser: 0.91 mg/dL (ref 0.61–1.24)
GFR calc Af Amer: >60 mL/min (ref >60)
GFR calc non Af Amer: >60 mL/min (ref >60)
GLUCOSE: 130 mg/dL — AB (ref 65–99)
POTASSIUM: 2.1 mmol/L — AB (ref 3.5–5.1)
SODIUM: 124 mmol/L — AB (ref 135–145)
Total Protein: 7.9 g/dL (ref 6.5–8.1)

## 2015-09-19 LAB — OSMOLALITY: Osmolality: 254 mOsm/kg — ABNORMAL LOW (ref 275–295)

## 2015-09-19 LAB — I-STAT CG4 LACTIC ACID, ED
Lactic Acid, Venous: 1.56 mmol/L (ref 0.5–1.9)
Lactic Acid, Venous: 1.47 mmol/L (ref 0.5–1.9)

## 2015-09-19 LAB — MAGNESIUM: Magnesium: 1.4 mg/dL — ABNORMAL LOW (ref 1.7–2.4)

## 2015-09-19 LAB — OSMOLALITY, URINE: Osmolality, Ur: 276 mOsm/kg — ABNORMAL LOW (ref 300–900)

## 2015-09-19 LAB — SODIUM, URINE, RANDOM: SODIUM UR: 16 mmol/L

## 2015-09-19 MED ORDER — TRAZODONE HCL 50 MG PO TABS
50.0000 mg | ORAL_TABLET | Freq: Every day | ORAL | Status: DC
  Administered 2015-09-20 (×2): 50 mg via ORAL
  Filled 2015-09-19 (×2): qty 1

## 2015-09-19 MED ORDER — TRAZODONE HCL 50 MG PO TABS: 50.0000 mg | ORAL_TABLET | Freq: Every evening | ORAL | Status: DC | PRN

## 2015-09-19 MED ORDER — OXYCODONE HCL 5 MG PO TABS
5.0000 mg | ORAL_TABLET | Freq: Four times a day (QID) | ORAL | Status: DC | PRN
  Administered 2015-09-20 – 2015-09-21 (×4): 5 mg via ORAL
  Filled 2015-09-19 (×4): qty 1

## 2015-09-19 MED ORDER — ATORVASTATIN CALCIUM 40 MG PO TABS
40.0000 mg | ORAL_TABLET | Freq: Every day | ORAL | Status: DC
  Administered 2015-09-20 (×2): 40 mg via ORAL
  Filled 2015-09-19 (×2): qty 1

## 2015-09-19 MED ORDER — ONDANSETRON HCL 4 MG PO TABS: 4.0000 mg | ORAL_TABLET | Freq: Four times a day (QID) | ORAL | Status: DC | PRN

## 2015-09-19 MED ORDER — NICOTINE 21 MG/24HR TD PT24
21.0000 mg | MEDICATED_PATCH | Freq: Every day | TRANSDERMAL | Status: DC
  Administered 2015-09-20: 21 mg via TRANSDERMAL
  Filled 2015-09-19 (×2): qty 1

## 2015-09-19 MED ORDER — POTASSIUM CHLORIDE CRYS ER 20 MEQ PO TBCR
40.0000 meq | EXTENDED_RELEASE_TABLET | Freq: Once | ORAL | Status: AC
  Administered 2015-09-19: 40 meq via ORAL
  Filled 2015-09-19: qty 2

## 2015-09-19 MED ORDER — AMLODIPINE BESYLATE 10 MG PO TABS
10.0000 mg | ORAL_TABLET | Freq: Every day | ORAL | Status: DC
  Administered 2015-09-20 (×2): 10 mg via ORAL
  Filled 2015-09-19 (×2): qty 1

## 2015-09-19 MED ORDER — HYDROMORPHONE HCL 1 MG/ML IJ SOLN
1.0000 mg | Freq: Once | INTRAMUSCULAR | Status: AC
  Administered 2015-09-19: 1 mg via INTRAVENOUS
  Filled 2015-09-19: qty 1

## 2015-09-19 MED ORDER — VANCOMYCIN HCL IN DEXTROSE 1-5 GM/200ML-% IV SOLN
1000.0000 mg | Freq: Two times a day (BID) | INTRAVENOUS | Status: DC
  Administered 2015-09-20 – 2015-09-21 (×4): 1000 mg via INTRAVENOUS
  Filled 2015-09-19 (×5): qty 200

## 2015-09-19 MED ORDER — SODIUM CHLORIDE 0.9% FLUSH
3.0000 mL | Freq: Two times a day (BID) | INTRAVENOUS | Status: DC
  Administered 2015-09-20: 3 mL via INTRAVENOUS

## 2015-09-19 MED ORDER — SODIUM CHLORIDE 0.9 % IV BOLUS (SEPSIS)
1000.0000 mL | Freq: Once | INTRAVENOUS | Status: AC
  Administered 2015-09-19: 1000 mL via INTRAVENOUS

## 2015-09-19 MED ORDER — PANTOPRAZOLE SODIUM 40 MG PO TBEC
40.0000 mg | DELAYED_RELEASE_TABLET | Freq: Every day | ORAL | Status: DC
  Administered 2015-09-20 (×2): 40 mg via ORAL
  Filled 2015-09-19 (×2): qty 1

## 2015-09-19 MED ORDER — KETOROLAC TROMETHAMINE 30 MG/ML IJ SOLN
30.0000 mg | Freq: Four times a day (QID) | INTRAMUSCULAR | Status: DC | PRN
  Administered 2015-09-20: 30 mg via INTRAVENOUS
  Filled 2015-09-19 (×2): qty 1

## 2015-09-19 MED ORDER — HEPARIN SODIUM (PORCINE) 5000 UNIT/ML IJ SOLN
5000.0000 [IU] | Freq: Three times a day (TID) | INTRAMUSCULAR | Status: DC
  Administered 2015-09-20 – 2015-09-21 (×4): 5000 [IU] via SUBCUTANEOUS
  Filled 2015-09-19 (×4): qty 1

## 2015-09-19 MED ORDER — PIPERACILLIN-TAZOBACTAM 3.375 G IVPB 30 MIN
3.3750 g | Freq: Once | INTRAVENOUS | Status: AC
  Administered 2015-09-19: 3.375 g via INTRAVENOUS
  Filled 2015-09-19: qty 50

## 2015-09-19 MED ORDER — TRAZODONE HCL 50 MG PO TABS: 50.0000 mg | ORAL_TABLET | Freq: Every day | ORAL | Status: DC

## 2015-09-19 MED ORDER — POTASSIUM CHLORIDE CRYS ER 20 MEQ PO TBCR
20.0000 meq | EXTENDED_RELEASE_TABLET | Freq: Every day | ORAL | Status: DC
  Administered 2015-09-20 (×2): 20 meq via ORAL
  Filled 2015-09-19 (×3): qty 1

## 2015-09-19 MED ORDER — ONDANSETRON HCL 4 MG/2ML IJ SOLN: 4.0000 mg | Freq: Four times a day (QID) | INTRAMUSCULAR | Status: DC | PRN

## 2015-09-19 MED ORDER — PIPERACILLIN-TAZOBACTAM 3.375 G IVPB
3.3750 g | Freq: Three times a day (TID) | INTRAVENOUS | Status: DC
  Administered 2015-09-20 – 2015-09-21 (×4): 3.375 g via INTRAVENOUS
  Filled 2015-09-19 (×6): qty 50

## 2015-09-19 MED ORDER — POTASSIUM CHLORIDE IN NACL 40-0.9 MEQ/L-% IV SOLN
INTRAVENOUS | Status: AC
  Administered 2015-09-20 (×2): 100 mL/h via INTRAVENOUS
  Filled 2015-09-19 (×2): qty 1000

## 2015-09-19 MED ORDER — ONDANSETRON HCL 4 MG/2ML IJ SOLN
4.0000 mg | Freq: Once | INTRAMUSCULAR | Status: AC
  Administered 2015-09-19: 4 mg via INTRAVENOUS
  Filled 2015-09-19: qty 2

## 2015-09-19 MED ORDER — POTASSIUM CHLORIDE 10 MEQ/100ML IV SOLN
10.0000 meq | INTRAVENOUS | Status: AC
  Administered 2015-09-19 (×2): 10 meq via INTRAVENOUS
  Filled 2015-09-19 (×2): qty 100

## 2015-09-19 MED ORDER — LEVETIRACETAM ER 500 MG PO TB24
1000.0000 mg | ORAL_TABLET | Freq: Every day | ORAL | Status: DC
  Administered 2015-09-20 (×2): 1000 mg via ORAL
  Filled 2015-09-19 (×3): qty 2

## 2015-09-19 MED ORDER — KETOROLAC TROMETHAMINE 30 MG/ML IJ SOLN
30.0000 mg | Freq: Once | INTRAMUSCULAR | Status: AC
  Administered 2015-09-19: 30 mg via INTRAVENOUS
  Filled 2015-09-19: qty 1

## 2015-09-19 NOTE — Progress Notes (Signed)
Pharmacy Antibiotic Note  Jared Reyes is a 60 y.o. male admitted on 09/19/2015 with cellulitis.  Pt reports recent cat bite on R forearm and subsequent fall onto R elbow. Pt seen in clinic 8/18 and prescribed Augmentin but has not started any antibiotics yet. Reports redness has worsened and is having difficulty moving R arm.  Pharmacy has been consulted for Vancomycin and Zosyn dosing.  Plan: -Zosyn 3.375 gm over 30 minutes x1  -Zosyn 3.375 gm extended infusion q8hr starting 6 hours after dose -Vancomycin 1 gm q12h -Monitor S/Sx infection, renal function, and VT as indicated  Height: 5\' 8"  (172.7 cm) Weight: 150 lb (68 kg) IBW/kg (Calculated) : 68.4  Temp (24hrs), Avg:98.9 F (37.2 C), Min:98.9 F (37.2 C), Max:98.9 F (37.2 C)   Recent Labs Lab 09/19/15 1653 09/19/15 1715  WBC 16.6*  --   CREATININE 0.91  --   LATICACIDVEN  --  1.56    Estimated Creatinine Clearance: 83 mL/min (by C-G formula based on SCr of 0.91 mg/dL).    No Known Allergies  Antimicrobials this admission: 8/21 Zosyn >>  8/21 Vanco >>   Dose adjustments this admission: n/a  Microbiology results: n/a  Thank you for allowing pharmacy to be a part of this patient's care.  Fredonia HighlandMichael Bitonti, PharmD PGY-1 Pharmacy Resident Pager: [phone]

## 2015-09-19 NOTE — ED Triage Notes (Signed)
He c/o R elbow redness, pain, swelling increasing over the past several days. He fell last week and [facility], but only  Had mild elbow pain at that time. He was bitten on wrist by cat last week also. [facility] doctor at [facility] gave him abx at his appt last week but he did not take them. He is alert and breathing easily.

## 2015-09-19 NOTE — Assessment & Plan Note (Signed)
2 recent traumas to R upper extremity. Exam is most concerning for cellulitis following cat bite given redness, swelling and soft tissue tenderness. There is no deformity or bony tenderness but patient has had a FOOSH injury on his dominant side.  P: Augmentin course He is up to date on Tdap Compression ace bandage applied to R wrist Ice, elevation X-rays of hand, wrist and forearm

## 2015-09-19 NOTE — H&P (Signed)
History and Physical    Jared Reyes ZOX:[id number] DOB: 03-Sep-1955 DOA: 09/19/2015  PCP: Lora PaulaFUNCHES, JOSALYN C, MD   Patient coming from: Home.  Chief Complaint: Right elbow swelling.  HPI: Jared Reyes is a 60 y.o. male with medical history significant of anxiety, depression, seizure disorder, alcoholism, essential hypertension who is coming to the emergency department due to progressively worse swelling of his left forearm for several days.  Per patient, about 8 days he was bitten by a cat on his right wrist area. Next day, he accidentally fell on his right elbow. Several days later the patient noticed that his elbow was tender and his swelling was getting worse. He saw a physician 4 days after the Bite/3 days after fall on right elbow and was prescribed Augmentin, but has not taken the prescription. Today he comes to the emergency department, since the swelling and tenderness had continued to get worse. His right elbow ROM is severely limited. He also reports low-grade temperatures.He denies headache, chest pain, dyspnea, palpitations, dizziness, diaphoresis, pitting edema of the lower extremities, PND or orthopnea.  ED Course: Workup shows leukocytosis of 16.6 K, sodium of 124 and potassium of 2.1 mmol/L. Right elbow x-rays shows: Prominent diffuse right elbow soft tissue swelling. No fracture, joint effusion, malalignment or specific radiographic findings of osteomyelitis. He was given Zosyn, vancomycin, potassium replacement and analgesics.  Review of Systems: As per HPI otherwise 10 point review of systems negative.     Past Medical History:  Diagnosis Date  . Anxiety   . Esophagitis   . Hypertension   . Seizures (HCC) 01-08-07   alcohol withdrawal   . Substance abuse    alcoholism    Past Surgical History:  Procedure Laterality Date  . ABDOMINAL SURGERY    . HAND SURGERY     left hand  . HERNIA REPAIR    . TONSILLECTOMY       reports that he has been smoking  Cigarettes.  He has been smoking about 1.00 pack per day. He has never used smokeless tobacco. He reports that he drinks about 25.2 oz of alcohol per week . He reports that he does not use drugs.  No Known Allergies  Family History  Problem Relation Age of Onset  . Alcohol abuse      fhx  . Diabetes      fhx  . Hypertension      fhx  . Hypertension Mother   . Diabetes Mother   . Hypertension Father   . Hypertension Sister   . Hypertension Brother     Prior to Admission medications   Medication Sig Start Date End Date Taking? Authorizing Provider  acetaminophen-codeine (TYLENOL #3) 300-30 MG tablet Take 1 tablet by mouth every 8 (eight) hours as needed for moderate pain. 07/26/15   Josalyn Funches, MD  amLODipine (NORVASC) 10 MG tablet Take 1 tablet (10 mg total) by mouth daily. 07/26/15   Josalyn Funches, MD  amoxicillin-clavulanate (AUGMENTIN) 875-125 MG tablet Take 1 tablet by mouth 2 (two) times daily. 09/16/15   Josalyn Funches, MD  atorvastatin (LIPITOR) 40 MG tablet Take 1 tablet (40 mg total) by mouth daily. 03/14/15   Josalyn Funches, MD  hydrochlorothiazide (HYDRODIURIL) 25 MG tablet Take 1 tablet (25 mg total) by mouth daily. 07/26/15   Josalyn Funches, MD  ketotifen (ZADITOR) 0.025 % ophthalmic solution Place 1 drop into both eyes 2 (two) times daily. Patient not taking: Reported on 09/16/2015 04/26/15   Dessa PhiJosalyn Funches, MD  levETIRAcetam (KEPPRA XR) 500 MG 24 hr tablet TAKE 2 TABLETS BY MOUTH DAILY. 05/03/15   Josalyn Funches, MD  montelukast (SINGULAIR) 10 MG tablet Take 1 tablet (10 mg total) by mouth at bedtime. Patient not taking: Reported on 09/16/2015 04/26/15   Dessa Phi, MD  potassium chloride SA (K-DUR,KLOR-CON) 10 MEQ tablet Take 1 tablet (10 mEq total) by mouth daily. 07/29/15   Jaclyn Shaggy, MD  traZODone (DESYREL) 100 MG tablet Take 0.5-1 tablets (50-100 mg total) by mouth at bedtime. 07/26/15   Dessa Phi, MD    Physical Exam: Vitals:   09/19/15 1637  09/19/15 1850  BP: 143/98 128/89  Pulse: (!) 121 107  Resp: 18 12  Temp: 98.9 F (37.2 C)   TempSrc: Oral   SpO2: 94% 97%  Weight: 68 kg (150 lb)   Height: 5\' 8"  (1.727 m)       Constitutional: NAD, calm, comfortable Vitals:   09/19/15 1637 09/19/15 1850  BP: 143/98 128/89  Pulse: (!) 121 107  Resp: 18 12  Temp: 98.9 F (37.2 C)   TempSrc: Oral   SpO2: 94% 97%  Weight: 68 kg (150 lb)   Height: 5\' 8"  (1.727 m)    Eyes: PERRL, lids and conjunctivae normal ENMT: Mucous membranes are moist. Posterior pharynx shows erythema. Dentition in very poor state of repair.  Neck: normal, supple, no masses, no thyromegaly Respiratory: clear to auscultation bilaterally, no wheezing, no crackles. Normal respiratory effort. No accessory muscle use.  Cardiovascular: Tachycardic with a regular rate at 104 BPM, no murmurs / rubs / gallops. No extremity edema. 2+ pedal pulses. No carotid bruits.  Abdomen: Midline surgical scar, + easily reduced umbilical hernia, BS positive. no tenderness, no masses palpated.  Musculoskeletal:  Edema, tenderness with severe decreased ROM of Right shoulder.  Skin: Tenderness to palpation with surrounding edema, erythema and calor of R shoulder. Neurologic: CN 2-12 grossly intact. Sensation intact, DTR normal. Strength 5/5 in all 4.  Psychiatric: Normal judgment and insight. Alert and oriented x 4. Normal mood.     Labs on Admission: I have personally reviewed following labs and imaging studies  CBC:  Recent Labs Lab 09/19/15 1653  WBC 16.6*  NEUTROABS 13.6*  HGB 15.5  HCT 41.5  MCV 95.2  PLT 263   Basic Metabolic Panel:  Recent Labs Lab 09/19/15 1653  NA 124*  K 2.1*  CL 74*  CO2 33*  GLUCOSE 130*  BUN 10  CREATININE 0.91  CALCIUM 8.4*   GFR: Estimated Creatinine Clearance: 83 mL/min (by C-G formula based on SCr of 0.91 mg/dL). Liver Function Tests:  Recent Labs Lab 09/19/15 1653  AST 58*  ALT 58  ALKPHOS 80  BILITOT 2.2*  PROT  7.9  ALBUMIN 3.3*   Urine analysis:    Component Value Date/Time   COLORURINE AMBER BIOCHEMICALS MAY BE AFFECTED BY COLOR (A) 01/07/2007 2119   APPEARANCEUR CLEAR 01/07/2007 2119   LABSPEC 1.010 01/07/2007 2119   PHURINE 5.5 01/07/2007 2119   GLUCOSEU NEGATIVE 01/07/2007 2119   HGBUR NEGATIVE 01/07/2007 2119   BILIRUBINUR neg 12/03/2014 1507   KETONESUR NEGATIVE 01/07/2007 2119   PROTEINUR >=300 12/03/2014 1507   PROTEINUR NEGATIVE 01/07/2007 2119   UROBILINOGEN 2.0 12/03/2014 1507   UROBILINOGEN 1.0 01/07/2007 2119   NITRITE neg 12/03/2014 1507   NITRITE NEGATIVE 01/07/2007 2119   LEUKOCYTESUR Negative 12/03/2014 1507    Radiological Exams on Admission: Dg Elbow Complete Right  Result Date: 09/19/2015 CLINICAL DATA:  Pain, swelling and erythema  in the right elbow after recent cat bite to the wrist. EXAM: RIGHT ELBOW - COMPLETE 3+ VIEW COMPARISON:  None. FINDINGS: There is prominent soft tissue swelling about the right elbow. No fracture, joint effusion, dislocation or suspicious focal osseous lesion. No definite cortical erosions or periosteal reaction. Tiny enthesophytes are noted in close proximity to the medial and lateral epicondyles of the right distal humerus. IMPRESSION: Prominent diffuse right elbow soft tissue swelling. No fracture, joint effusion, malalignment or specific radiographic findings of osteomyelitis. Electronically Signed   By: Delbert PhenixJason A Poff M.D.   On: 09/19/2015 17:23  Echocardiogram 03/25/2015  ------------------------------------------------------------------- History:   PMH:  Tachycardia. Acquired from the patient and from the patient&'s chart.  Stroke.  Risk factors:  Current tobacco use. Hypertension.  ------------------------------------------------------------------- Study Conclusions  - Left ventricle: The cavity size was normal. There was mild   concentric hypertrophy. Systolic function was normal. The   estimated ejection fraction was in the  range of 60% to 65%. Wall   motion was normal; there were no regional wall motion   abnormalities. Doppler parameters are consistent with abnormal   left ventricular relaxation (grade 1 diastolic dysfunction). - Aortic valve: Transvalvular velocity was within the normal range.   There was no stenosis. There was no regurgitation. - Mitral valve: There was no regurgitation. - Right ventricle: The cavity size was normal. Wall thickness was   normal. Systolic function was normal. - Tricuspid valve: There was no regurgitation. - Inferior vena cava: The vessel was normal in size. The   respirophasic diameter changes were in the normal range (>= 50%),   consistent with normal central venous pressure.  EKG: Independently reviewed. Vent. rate 106 BPM PR interval * ms QRS duration 133 ms QT/QTc 385/512 ms P-R-T axes * 126 18 Sinus tachycardia Right bundle branch block  Assessment/Plan Principal Problem:   Cellulitis of right upper extremity   Right forearm injury Admit to telemetry/inpatient. The patient will be seen by hand surgery. Continue broad-spectrum IV antibiotics. Toradol 30 mg IVP every 6 hours when necessary 5 doses. (The patient does not tolerate oral NSAIDs due to GI discomfort) Protonix 40 mg by mouth daily for GI prophylaxis. Oxycodone 5 mg by mouth every 6 hours when necessary.  Active Problems:    Hypokalemia Telemetry monitoring. Hold hydrochlorothiazide. Check magnesium level. Continue potassium replacement. Follow-up potassium level and EKG in the morning.    Hyponatremia Secondary to hydrochlorothiazide, so will hold for now. May need different antihypertensive on discharge. Check serum and urine osmolality. Check urine sodium. Continue normal saline infusion.    Alcohol use (HCC) Per patient, he has not had alcohol in at least 2 weeks. He denies any signs or symptoms of withdrawal during this period.    Essential hypertension Continue amlodipine 10  mg by mouth daily. Hold hydrochlorothiazide due to hyponatremia and hypokalemia. Monitor blood pressure closely. May need another antihypertensive agent for BP control.    Seizures (HCC) Alcohol-related per patient. He has not had a of alcoholore than 2 weeks per patient. Continue Keppra 500 mg by mouth twice a day.    Insomnia Continue nocturnal trazodone.        DVT prophylaxis: Heparin SQ. Code Status: Full code. Family Communication:  Disposition Plan: Admit for IV antibiotic therapy and hand surgery evaluation. Consults called: Hand surgery () Admission status: Inpatient/telemetry.      Bobette Moavid Manuel Jeremi Losito MD [facility] Pager [phone].  If 7PM-7AM, please contact night-coverage [email or web address] Password [email or web address]  09/19/2015, 7:15 PM

## 2015-09-19 NOTE — ED Notes (Signed)
MD at bedside. 

## 2015-09-19 NOTE — Consult Note (Signed)
Reason for Consult:Right elbow swelling Referring Physician: Dr. Audelia Hives [facility]  [place] Jared Reyes is an 60 y.o. male.  HPI: Jared Reyes is a 60 y.o. male with medical history significant of anxiety, depression, seizure disorder, alcoholism, essential hypertension who is coming to the emergency department due to progressively worse swelling of his left forearm for several days. Pt fell on elbow and has had swelling and pain. Also bit by a cat greater than 7 days ago. No prior surgery to right elbow.  Past Medical History:  Diagnosis Date  . Anxiety   . Esophagitis   . Hypertension   . Seizures ([facility]) 01-08-07   alcohol withdrawal   . Substance abuse    alcoholism    Past Surgical History:  Procedure Laterality Date  . ABDOMINAL SURGERY    . HAND SURGERY     left hand  . HERNIA REPAIR    . TONSILLECTOMY      Family History  Problem Relation Age of Onset  . Hypertension Mother   . Diabetes Mother   . Hypertension Father   . Alcohol abuse      fhx  . Diabetes      fhx  . Hypertension      fhx  . Hypertension Sister   . Hypertension Brother     Social History:  reports that he has been smoking Cigarettes.  He has been smoking about 1.00 pack per day. He has never used smokeless tobacco. He reports that he drinks about 25.2 oz of alcohol per week . He reports that he does not use drugs.  Allergies: No Known Allergies  Medications: I have reviewed the patient's current medications.  Results for orders placed or performed during the hospital encounter of 09/19/15 (from the past 48 hour(s))  Comprehensive metabolic panel     Status: Abnormal   Collection Time: 09/19/15  4:53 PM  Result Value Ref Range   Sodium 124 (L) 135 - 145 mmol/L   Potassium 2.1 (LL) 3.5 - 5.1 mmol/L    Comment: CRITICAL RESULT CALLED TO, READ BACK BY AND VERIFIED WITH: AOverton Mam RN [phone] [id number] GREEN R    Chloride 74 (L) 101 - 111 mmol/L   CO2 33 (H) 22 - 32 mmol/L   Glucose, Bld 130  (H) 65 - 99 mg/dL   BUN 10 6 - 20 mg/dL   Creatinine, Ser 0.91 0.61 - 1.24 mg/dL   Calcium 8.4 (L) 8.9 - 10.3 mg/dL   Total Protein 7.9 6.5 - 8.1 g/dL   Albumin 3.3 (L) 3.5 - 5.0 g/dL   AST 58 (H) 15 - 41 U/L   ALT 58 17 - 63 U/L   Alkaline Phosphatase 80 38 - 126 U/L   Total Bilirubin 2.2 (H) 0.3 - 1.2 mg/dL   GFR calc non Af Amer >60 >60 mL/min   GFR calc Af Amer >60 >60 mL/min    Comment: (NOTE) The eGFR has been calculated using the CKD EPI equation. This calculation has not been validated in all clinical situations. eGFR's persistently <60 mL/min signify possible Chronic Kidney Disease.    Anion gap 17 (H) 5 - 15  CBC with Differential     Status: Abnormal   Collection Time: 09/19/15  4:53 PM  Result Value Ref Range   WBC 16.6 (H) 4.0 - 10.5 K/uL   RBC 4.36 4.22 - 5.81 MIL/uL   Hemoglobin 15.5 13.0 - 17.0 g/dL   HCT 41.5 39.0 - 52.0 %  MCV 95.2 78.0 - 100.0 fL   MCH 35.6 (H) 26.0 - 34.0 pg   MCHC 37.3 (H) 30.0 - 36.0 g/dL    Comment: RULED OUT INTERFERING SUBSTANCES   RDW 12.6 11.5 - 15.5 %   Platelets 263 150 - 400 K/uL   Neutrophils Relative % 82 %   Neutro Abs 13.6 (H) 1.7 - 7.7 K/uL   Lymphocytes Relative 6 %   Lymphs Abs 1.0 0.7 - 4.0 K/uL   Monocytes Relative 12 %   Monocytes Absolute 2.0 (H) 0.1 - 1.0 K/uL   Eosinophils Relative 0 %   Eosinophils Absolute 0.0 0.0 - 0.7 K/uL   Basophils Relative 0 %   Basophils Absolute 0.0 0.0 - 0.1 K/uL  I-Stat CG4 Lactic Acid, ED     Status: None   Collection Time: 09/19/15  5:15 PM  Result Value Ref Range   Lactic Acid, Venous 1.56 0.5 - 1.9 mmol/L  I-Stat CG4 Lactic Acid, ED     Status: None   Collection Time: 09/19/15  7:53 PM  Result Value Ref Range   Lactic Acid, Venous 1.47 0.5 - 1.9 mmol/L    Dg Elbow Complete Right  Result Date: 09/19/2015 CLINICAL DATA:  Pain, swelling and erythema in the right elbow after recent cat bite to the wrist. EXAM: RIGHT ELBOW - COMPLETE 3+ VIEW COMPARISON:  None. FINDINGS: There  is prominent soft tissue swelling about the right elbow. No fracture, joint effusion, dislocation or suspicious focal osseous lesion. No definite cortical erosions or periosteal reaction. Tiny enthesophytes are noted in close proximity to the medial and lateral epicondyles of the right distal humerus. IMPRESSION: Prominent diffuse right elbow soft tissue swelling. No fracture, joint effusion, malalignment or specific radiographic findings of osteomyelitis. Electronically Signed   By: Ilona Sorrel M.D.   On: 09/19/2015 17:23    ROS as noted in chart Blood pressure 127/97, pulse 103, temperature 98.9 F (37.2 C), temperature source Oral, resp. rate 19, height 5' 8" (1.727 m), weight 68 kg (150 lb), SpO2 96 %. Physical Exam  General Appearance:  Alert, cooperative, no distress, appears stated age  Head:  Normocephalic, without obvious abnormality, atraumatic  Eyes:  Pupils equal, conjunctiva/corneas clear,         Throat: Lips, mucosa, and tongue normal; teeth and gums normal  Neck: No visible masses     Lungs:   respirations unlabored     Heart:  Regular rate and rhythm,  Abdomen:   Distended        Extremities: RUE: pictures noted in chart. Pt with moderate ecchymoses in elbow, no fluctuance, able to flex and extend elbow. Able to flex and extend wrist.  Able to extend thumb, able to wiggle fingers.  nvi right hand.  Pulses: 2+ and symmetric  Skin: Skin color, texture, turgor normal, no rashes or lesions     Neurologic: Normal    Assessment/Plan: Right elbow contusion versus early cellulitis  No surgery recommended Long arm splint for comfort Pt being admitted to medicine  Can f/u with me in one week to reexamine Please contact me if any questions [phone]    Linna Hoff 09/19/2015, 8:24 PM

## 2015-09-19 NOTE — ED Provider Notes (Signed)
[facility]-EMERGENCY DEPT Provider Note   CSN: [id number] Arrival date & time: 09/19/15  1632     History   Chief Complaint Chief Complaint  Patient presents with  . Cellulitis    HPI Jared Reyes is a 60 y.o. male.  The history is provided by the patient and medical records.    60 year old male with history of anxiety, hypertension, substance abuse, withdrawal seizures, presenting to the ED for cellulitis of the right arm. Patient reports he was bitten by a cat last week on 09/13/15 on the right forearm when the cat got upset by dogs barking at it.  States a few days after he also fell onto his right elbow.  No head injury or LOC.  He states [facility] and prescribed Augmentin, however by the time he left his appointment and went to the pharmacy was closed so he has not had any antibiotics thus far.  His tetanus is UTD.  States cat is UTD on vaccines as well.   He denies any fever or chills. States the redness has progressively worsened since onset. He is now having difficulty moving his right arm, specifically at the elbow. He is right-hand dominant and had limited movement of his left arm at baseline due to contractures.   Past Medical History:  Diagnosis Date  . Anxiety   . Esophagitis   . Hypertension   . Seizures (HCC) 01-08-07   alcohol withdrawal   . Substance abuse    alcoholism    Patient Active Problem List   Diagnosis Date Noted  . Cat bite of right hand 09/19/2015  . Right wrist injury 09/19/2015  . Right forearm injury 09/16/2015  . History of bilateral cataract extraction 07/26/2015  . Allergic rhinoconjunctivitis of left eye 04/26/2015  . History of stroke 03/11/2015  . History of gastric ulcer 12/31/2014  . Abnormal urinalysis 12/03/2014  . Chronic low back pain 10/22/2014  . Inguinal hernia 09/30/2014  . Insomnia 09/25/2013  . Seizures (HCC) 07/11/2007  . DEPRESSION 01/14/2007  . Chronic anxiety 10/25/2006  . Alcohol  use (HCC) 10/25/2006  . Essential hypertension 10/25/2006  . ESOPHAGITIS NOS 10/25/2006    Past Surgical History:  Procedure Laterality Date  . ABDOMINAL SURGERY    . HAND SURGERY     left hand  . HERNIA REPAIR    . TONSILLECTOMY         Home Medications    Prior to Admission medications   Medication Sig Start Date End Date Taking? Authorizing Provider  acetaminophen-codeine (TYLENOL #3) 300-30 MG tablet Take 1 tablet by mouth every 8 (eight) hours as needed for moderate pain. 07/26/15   Josalyn Funches, MD  amLODipine (NORVASC) 10 MG tablet Take 1 tablet (10 mg total) by mouth daily. 07/26/15   Josalyn Funches, MD  amoxicillin-clavulanate (AUGMENTIN) 875-125 MG tablet Take 1 tablet by mouth 2 (two) times daily. 09/16/15   Josalyn Funches, MD  atorvastatin (LIPITOR) 40 MG tablet Take 1 tablet (40 mg total) by mouth daily. 03/14/15   Josalyn Funches, MD  hydrochlorothiazide (HYDRODIURIL) 25 MG tablet Take 1 tablet (25 mg total) by mouth daily. 07/26/15   Josalyn Funches, MD  ketotifen (ZADITOR) 0.025 % ophthalmic solution Place 1 drop into both eyes 2 (two) times daily. Patient not taking: Reported on 09/16/2015 04/26/15   Dessa Phi, MD  levETIRAcetam (KEPPRA XR) 500 MG 24 hr tablet TAKE 2 TABLETS BY MOUTH DAILY. 05/03/15   Dessa Phi, MD  montelukast (SINGULAIR) 10 MG tablet Take 1 tablet (10 mg total) by mouth at bedtime. Patient not taking: Reported on 09/16/2015 04/26/15   Dessa Phi, MD  potassium chloride SA (K-DUR,KLOR-CON) 10 MEQ tablet Take 1 tablet (10 mEq total) by mouth daily. 07/29/15   Jaclyn Shaggy, MD  traZODone (DESYREL) 100 MG tablet Take 0.5-1 tablets (50-100 mg total) by mouth at bedtime. 07/26/15   Dessa Phi, MD    Family History Family History  Problem Relation Age of Onset  . Alcohol abuse      fhx  . Diabetes      fhx  . Hypertension      fhx  . Hypertension Mother   . Diabetes Mother   . Hypertension Father   . Hypertension Sister   .  Hypertension Brother     Social History Social History  Substance Use Topics  . Smoking status: Current Every Day Smoker    Packs/day: 1.00    Types: Cigarettes  . Smokeless tobacco: Never Used  . Alcohol use 25.2 oz/week    42 Cans of beer per week     Comment: drinks beer daily     Allergies   Review of patient's allergies indicates no known allergies.   Review of Systems Review of Systems  Musculoskeletal: Positive for arthralgias and joint swelling.  All other systems reviewed and are negative.    Physical Exam Updated Vital Signs BP 143/98 (BP Location: Right Arm)   Pulse (!) 121   Temp 98.9 F (37.2 C) (Oral)   Resp 18   Ht 5\' 8"  (1.727 m)   Wt 68 kg   SpO2 94%   BMI 22.81 kg/m   Physical Exam  Constitutional: He is oriented to person, place, and time. He appears well-developed and well-nourished.  HENT:  Head: Normocephalic and atraumatic.  Mouth/Throat: Oropharynx is clear and moist.  Eyes: Conjunctivae and EOM are normal. Pupils are equal, round, and reactive to light.  Neck: Normal range of motion.  Cardiovascular: Normal rate, regular rhythm and normal heart sounds.   Pulmonary/Chest: Effort normal and breath sounds normal.  Abdominal: Soft. Bowel sounds are normal.  Musculoskeletal: Normal range of motion.  Right elbow significant swollen with nearly circumferential erythema and warmth to touch which extends down the dorsal forearm where there are 2 small puncture wounds from cat bite at the radial aspect of the wrist; strong radial pulse and cap refill; normal grip strength Significant pain noted with any attempt at ROM of the right elbow; no fluctuance or active drainage appreciated Left arm is chronically contractured  Neurological: He is alert and oriented to person, place, and time.  Skin: Skin is warm and dry.  Psychiatric: He has a normal mood and affect.  Nursing note and vitals reviewed.      ED Treatments / Results  Labs (all labs  ordered are listed, but only abnormal results are displayed) Labs Reviewed  COMPREHENSIVE METABOLIC PANEL - Abnormal; Notable for the following:       Result Value   Sodium 124 (*)    Potassium 2.1 (*)    Chloride 74 (*)    CO2 33 (*)    Glucose, Bld 130 (*)    Calcium 8.4 (*)    Albumin 3.3 (*)    AST 58 (*)    Total Bilirubin 2.2 (*)    Anion gap 17 (*)    All other components within normal limits  CBC WITH DIFFERENTIAL/PLATELET - Abnormal; Notable for the following:  WBC 16.6 (*)    MCH 35.6 (*)    MCHC 37.3 (*)    Neutro Abs 13.6 (*)    Monocytes Absolute 2.0 (*)    All other components within normal limits  MAGNESIUM  I-STAT CG4 LACTIC ACID, ED    EKG  EKG Interpretation None       Radiology Dg Elbow Complete Right  Result Date: 09/19/2015 CLINICAL DATA:  Pain, swelling and erythema in the right elbow after recent cat bite to the wrist. EXAM: RIGHT ELBOW - COMPLETE 3+ VIEW COMPARISON:  None. FINDINGS: There is prominent soft tissue swelling about the right elbow. No fracture, joint effusion, dislocation or suspicious focal osseous lesion. No definite cortical erosions or periosteal reaction. Tiny enthesophytes are noted in close proximity to the medial and lateral epicondyles of the right distal humerus. IMPRESSION: Prominent diffuse right elbow soft tissue swelling. No fracture, joint effusion, malalignment or specific radiographic findings of osteomyelitis. Electronically Signed   By: Delbert PhenixJason A Poff M.D.   On: 09/19/2015 17:23    Procedures Procedures (including critical care time)  Medications Ordered in ED Medications  potassium chloride 10 mEq in 100 mL IVPB (0 mEq Intravenous Stopped 09/19/15 1954)  vancomycin (VANCOCIN) IVPB 1000 mg/200 mL premix (not administered)  piperacillin-tazobactam (ZOSYN) IVPB 3.375 g (not administered)  piperacillin-tazobactam (ZOSYN) IVPB 3.375 g (not administered)  ketorolac (TORADOL) 30 MG/ML injection 30 mg (not administered)    HYDROmorphone (DILAUDID) injection 1 mg (not administered)  ondansetron (ZOFRAN) injection 4 mg (not administered)  potassium chloride SA (K-DUR,KLOR-CON) CR tablet 40 mEq (40 mEq Oral Given 09/19/15 1854)  sodium chloride 0.9 % bolus 1,000 mL (0 mLs Intravenous Stopped 09/19/15 2043)     Initial Impression / Assessment and Plan / ED Course  I have reviewed the triage vital signs and the nursing notes.  Pertinent labs & imaging results that were available during my care of the patient were reviewed by me and considered in my medical decision making (see chart for details).  Clinical Course   60 y.o. M Here with extensive cellulitis of the right elbow likely from cat bite which occurred last week. He is afebrile and overall nontoxic in appearance. See photos above. His arm is neurovascularly intact but he does have significant pain with any attempt at range of motion of the right elbow. He is right-hand-dominant with limited movement of his left arm at baseline due to contractures. X-rays negative for any acute bony findings to suggest osteomyelitis. I do not appreciate any abscess on exam but do have clinical concern for septic joint. Labs with leukocytosis, lactate is normal. He does have a significant hypokalemia at 2.1. He is on HCTZ and has not been taking his potassium supplements at home. Patient states tetanus is up-to-date and cat is up-to-date on vaccinations as well.  Will start on broad spectrum vancomycin and Zosyn. Will consult hand surgery and admit to medicine. IV and PO K+ ordered.  Mag level and blood cultures pending.  6:52 PM Have spoken with on call hand surgery, Dr. Melvyn Novasrtmann-- he will see patient in consult.  Recommends to hold off on CT or MRI until his evaluation.  Agrees with IV abx for now.  Patient admitted to medicine service for ongoing care, discussed with Dr. Robb Matarrtiz-- he will place orders.  Final Clinical Impressions(s) / ED Diagnoses   Final diagnoses:  Cellulitis  of right upper extremity  Hypokalemia    New Prescriptions New Prescriptions   No medications on file  Garlon HatchetLisa M Aydon Swamy, PA-C 09/19/15 2045    Nelva Nayobert Beaton, MD 09/21/15 [phone]

## 2015-09-19 NOTE — Progress Notes (Signed)
Per LavoniaDallas, RN, pt refused to have bed alarm turned on.

## 2015-09-19 NOTE — ED Notes (Signed)
Patient is resting comfortably. 

## 2015-09-19 NOTE — ED Notes (Signed)
PA informed RN that pt is not to go upstairs until MD sees him, should be in 10 minutes.

## 2015-09-19 NOTE — Assessment & Plan Note (Signed)
BP controlled Meds: compliant Continue current regimen

## 2015-09-19 NOTE — ED Notes (Signed)
Potassium 2.1 called from lab, Dr. Radford PaxBeaton aware

## 2015-09-20 ENCOUNTER — Encounter (HOSPITAL_COMMUNITY): Payer: Self-pay | Admitting: General Practice

## 2015-09-20 DIAGNOSIS — G4700 Insomnia, unspecified: Secondary | ICD-10-CM

## 2015-09-20 DIAGNOSIS — L0390 Cellulitis, unspecified: Secondary | ICD-10-CM

## 2015-09-20 HISTORY — DX: Cellulitis, unspecified: L03.90

## 2015-09-20 LAB — COMPREHENSIVE METABOLIC PANEL
ALK PHOS: 63 U/L (ref 38–126)
ALT: 43 U/L (ref 17–63)
ANION GAP: 13 (ref 5–15)
AST: 48 U/L — ABNORMAL HIGH (ref 15–41)
Albumin: 2.6 g/dL — ABNORMAL LOW (ref 3.5–5.0)
BUN: 8 mg/dL (ref 6–20)
CALCIUM: 7.3 mg/dL — AB (ref 8.9–10.3)
CO2: 29 mmol/L (ref 22–32)
CREATININE: 0.94 mg/dL (ref 0.61–1.24)
Chloride: 84 mmol/L — ABNORMAL LOW (ref 101–111)
Glucose, Bld: 82 mg/dL (ref 65–99)
Potassium: 2.1 mmol/L — CL (ref 3.5–5.1)
Sodium: 126 mmol/L — ABNORMAL LOW (ref 135–145)
TOTAL PROTEIN: 5.9 g/dL — AB (ref 6.5–8.1)
Total Bilirubin: 1.7 mg/dL — ABNORMAL HIGH (ref 0.3–1.2)

## 2015-09-20 LAB — CBC WITH DIFFERENTIAL/PLATELET
Basophils Absolute: 0 10*3/uL (ref 0.0–0.1)
Basophils Relative: 0 %
EOS PCT: 0 %
Eosinophils Absolute: 0.1 10*3/uL (ref 0.0–0.7)
HCT: 37.8 % — ABNORMAL LOW (ref 39.0–52.0)
HEMOGLOBIN: 13.7 g/dL (ref 13.0–17.0)
LYMPHS ABS: 1.3 10*3/uL (ref 0.7–4.0)
LYMPHS PCT: 10 %
MCH: 34.5 pg — AB (ref 26.0–34.0)
MCHC: 36.2 g/dL — AB (ref 30.0–36.0)
MCV: 95.2 fL (ref 78.0–100.0)
MONOS PCT: 13 %
Monocytes Absolute: 1.7 10*3/uL — ABNORMAL HIGH (ref 0.1–1.0)
NEUTROS PCT: 77 %
Neutro Abs: 10.2 10*3/uL — ABNORMAL HIGH (ref 1.7–7.7)
Platelets: 233 10*3/uL (ref 150–400)
RBC: 3.97 MIL/uL — AB (ref 4.22–5.81)
RDW: 12.6 % (ref 11.5–15.5)
WBC: 13.3 10*3/uL — AB (ref 4.0–10.5)

## 2015-09-20 MED ORDER — MAGNESIUM SULFATE 4 GM/100ML IV SOLN
4.0000 g | Freq: Once | INTRAVENOUS | Status: AC
  Administered 2015-09-20: 4 g via INTRAVENOUS
  Filled 2015-09-20: qty 100

## 2015-09-20 MED ORDER — POTASSIUM CHLORIDE CRYS ER 20 MEQ PO TBCR
40.0000 meq | EXTENDED_RELEASE_TABLET | ORAL | Status: AC
  Administered 2015-09-20 (×3): 40 meq via ORAL
  Filled 2015-09-20 (×3): qty 2

## 2015-09-20 NOTE — Progress Notes (Signed)
PROGRESS NOTE    Jared Reyes  ZOX:[id number]  DOB: 1955/09/23  DOA: 09/19/2015 PCP: Lora PaulaFUNCHES, JOSALYN C, MD Outpatient Specialists:  Hospital course: HPI: Jared Reyes is a 60 y.o. male with medical history significant of anxiety, depression, seizure disorder, alcoholism, essential hypertension who presented to the emergency department due to progressively worse swelling of his left forearm for several days.  He was noted to have several electrolyte abnormalities related to chronic diuretic use.   Assessment & Plan:    Cellulitis of right upper extremity/ Right forearm injury The patient will be seen by hand surgery. Continue broad-spectrum IV antibiotics. Toradol 30 mg IVP every 6 hours when necessary 5 doses. (The patient does not tolerate oral NSAIDs due to GI discomfort) Protonix 40 mg by mouth daily for GI protection. Oxycodone 5 mg by mouth every 6 hours when necessary. Clinically improving with antibiotics.    Hypokalemia Telemetry monitoring. Hold hydrochlorothiazide. Replace low magnesium.   Continue potassium replacement.  Follow BMP.   Hyponatremia Secondary to hydrochlorothiazide, HOLDING May need different antihypertensive on discharge as he has poor outpatient compliance and follow up. Follow serum and urine osmolality. Urine sodium pending. Continue normal saline infusion. CMP in AM    Alcohol use (HCC) Per patient, he has not had alcohol in at least 2 weeks. He denies any signs or symptoms of withdrawal during this period.  Essential hypertension Continue amlodipine 10 mg by mouth daily. Hold hydrochlorothiazide due to hyponatremia and hypokalemia. Monitor blood pressure closely. May need another antihypertensive agent for BP control.  Seizures  Alcohol-withdrawal related per patient. He has not had a of alcoholore than 2 weeks per patient. Continue Keppra 500 mg by mouth twice a day.  Insomnia Continue nocturnal trazodone.     DVT  prophylaxis: Heparin SQ. Code Status: Full code. Family Communication:  Disposition Plan: Admit for IV antibiotic therapy and hand surgery evaluation. Consults called: Hand surgery () Admission status: Inpatient/telemetry.  Antimicrobials: Anti-infectives    Start     Dose/Rate Route Frequency Ordered Stop   09/20/15 0100  piperacillin-tazobactam (ZOSYN) IVPB 3.375 g     3.375 g 12.5 mL/hr over 240 Minutes Intravenous Every 8 hours 09/19/15 1845     09/19/15 1900  vancomycin (VANCOCIN) IVPB 1000 mg/200 mL premix     1,000 mg 200 mL/hr over 60 Minutes Intravenous Every 12 hours 09/19/15 1845     09/19/15 1900  piperacillin-tazobactam (ZOSYN) IVPB 3.375 g     3.375 g 100 mL/hr over 30 Minutes Intravenous  Once 09/19/15 1845 09/19/15 2122       Subjective: Pt reports less edema in elbow but still hot and tender to touch.  No fever or chills.   Objective: Vitals:   09/19/15 2000 09/19/15 2015 09/19/15 2111 09/20/15 0950  BP: 129/93 145/94 122/73 121/79  Pulse: 103 102 95 99  Resp: 17 17 18 18   Temp:   98.7 F (37.1 C) 98.5 F (36.9 C)  TempSrc:   Oral Oral  SpO2: 95% 97% 99% 100%  Weight:   67.9 kg (149 lb 12.8 oz)   Height:   5\' 8"  (1.727 m)     Intake/Output Summary (Last 24 hours) at 09/20/15 1008 Last data filed at 09/20/15 0950  Gross per 24 hour  Intake          1865.33 ml  Output             1975 ml  Net          -  109.67 ml   Filed Weights   09/19/15 1637 09/19/15 2111  Weight: 68 kg (150 lb) 67.9 kg (149 lb 12.8 oz)    Exam:  General exam: poorly groomed, malnourished appearing, NAD. Cooperative. Poor dentition.  Respiratory system: Clear. No increased work of breathing. Cardiovascular system: S1 & S2 heard, RRR. No JVD, murmurs, gallops, clicks or pedal edema. Gastrointestinal system: Abdomen is nondistended, soft and nontender. Normal bowel sounds heard. Central nervous system: Alert and oriented. No focal neurological deficits. Extremities: RUE  elbow tender, swollen, red, improved appearance from admission photos.  Data Reviewed: Basic Metabolic Panel:  Recent Labs Lab 09/19/15 1653 09/20/15 0342  NA 124* 126*  K 2.1* 2.1*  CL 74* 84*  CO2 33* 29  GLUCOSE 130* 82  BUN 10 8  CREATININE 0.91 0.94  CALCIUM 8.4* 7.3*  MG 1.4*  --    Liver Function Tests:  Recent Labs Lab 09/19/15 1653 09/20/15 0342  AST 58* 48*  ALT 58 43  ALKPHOS 80 63  BILITOT 2.2* 1.7*  PROT 7.9 5.9*  ALBUMIN 3.3* 2.6*   No results for input(s): LIPASE, AMYLASE in the last 168 hours. No results for input(s): AMMONIA in the last 168 hours. CBC:  Recent Labs Lab 09/19/15 1653 09/20/15 0342  WBC 16.6* 13.3*  NEUTROABS 13.6* 10.2*  HGB 15.5 13.7  HCT 41.5 37.8*  MCV 95.2 95.2  PLT 263 233   Cardiac Enzymes: No results for input(s): CKTOTAL, CKMB, CKMBINDEX, TROPONINI in the last 168 hours. CBG (last 3)  No results for input(s): GLUCAP in the last 72 hours. No results found for this or any previous visit (from the past 240 hour(s)).   Studies: Dg Elbow Complete Right  Result Date: 09/19/2015 CLINICAL DATA:  Pain, swelling and erythema in the right elbow after recent cat bite to the wrist. EXAM: RIGHT ELBOW - COMPLETE 3+ VIEW COMPARISON:  None. FINDINGS: There is prominent soft tissue swelling about the right elbow. No fracture, joint effusion, dislocation or suspicious focal osseous lesion. No definite cortical erosions or periosteal reaction. Tiny enthesophytes are noted in close proximity to the medial and lateral epicondyles of the right distal humerus. IMPRESSION: Prominent diffuse right elbow soft tissue swelling. No fracture, joint effusion, malalignment or specific radiographic findings of osteomyelitis. Electronically Signed   By: Delbert PhenixJason A Poff M.D.   On: 09/19/2015 17:23   Scheduled Meds: . amLODipine  10 mg Oral Daily  . atorvastatin  40 mg Oral Daily  . heparin  5,000 Units Subcutaneous Q8H  . levETIRAcetam  1,000 mg Oral  Daily  . magnesium sulfate 1 - 4 g bolus IVPB  4 g Intravenous Once  . nicotine  21 mg Transdermal Daily  . pantoprazole  40 mg Oral Daily  . piperacillin-tazobactam (ZOSYN)  IV  3.375 g Intravenous Q8H  . potassium chloride  20 mEq Oral Daily  . potassium chloride  40 mEq Oral Q4H  . sodium chloride flush  3 mL Intravenous Q12H  . traZODone  50 mg Oral QHS  . vancomycin  1,000 mg Intravenous Q12H   Continuous Infusions: . 0.9 % NaCl with KCl 40 mEq / L 100 mL/hr (09/20/15 0004)    Principal Problem:   Cellulitis of right upper extremity Active Problems:   Alcohol use (HCC)   Essential hypertension   Seizures (HCC)   Insomnia   Right forearm injury   Hypokalemia   Hyponatremia  Time spent:   Standley Dakinslanford Johnson, MD, FAAFP [facility] Pager [phone]  If 7PM-7AM, please contact night-coverage [email or web address] Password [email or web address] 09/20/2015, 10:08 AM    LOS: 1 day

## 2015-09-20 NOTE — Progress Notes (Signed)
Orthopedic Tech Progress Note Patient Details:  Jared Reyes [birth year] [id number]  Ortho Devices Type of Ortho Device: Ace wrap, Long arm splint, Arm sling   Jared Reyes 09/20/2015, 12:57 PM

## 2015-09-20 NOTE — Progress Notes (Signed)
Pt refusing to let this RN turn on bed alarm. Instructed pt that he is a high fall risk for multiple factors, which were explained to him, but he was adamant that he will just turn it off. 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 Modena JanskyKevin Cletis Clack RN

## 2015-09-21 DIAGNOSIS — Z789 Other specified health status: Secondary | ICD-10-CM

## 2015-09-21 DIAGNOSIS — E876 Hypokalemia: Secondary | ICD-10-CM

## 2015-09-21 DIAGNOSIS — E871 Hypo-osmolality and hyponatremia: Secondary | ICD-10-CM

## 2015-09-21 DIAGNOSIS — I10 Essential (primary) hypertension: Secondary | ICD-10-CM

## 2015-09-21 DIAGNOSIS — L03113 Cellulitis of right upper limb: Principal | ICD-10-CM

## 2015-09-21 LAB — CBC WITH DIFFERENTIAL/PLATELET
Basophils Absolute: 0 10*3/uL (ref 0.0–0.1)
Basophils Relative: 0 %
Eosinophils Absolute: 0.2 10*3/uL (ref 0.0–0.7)
Eosinophils Relative: 2 %
HEMATOCRIT: 36 % — AB (ref 39.0–52.0)
HEMOGLOBIN: 12.7 g/dL — AB (ref 13.0–17.0)
LYMPHS ABS: 1.2 10*3/uL (ref 0.7–4.0)
LYMPHS PCT: 13 %
MCH: 33.7 pg (ref 26.0–34.0)
MCHC: 35.3 g/dL (ref 30.0–36.0)
MCV: 95.5 fL (ref 78.0–100.0)
MONOS PCT: 15 %
Monocytes Absolute: 1.4 10*3/uL — ABNORMAL HIGH (ref 0.1–1.0)
NEUTROS ABS: 6.5 10*3/uL (ref 1.7–7.7)
NEUTROS PCT: 70 %
Platelets: 259 10*3/uL (ref 150–400)
RBC: 3.77 MIL/uL — AB (ref 4.22–5.81)
RDW: 12.7 % (ref 11.5–15.5)
WBC: 9.4 10*3/uL (ref 4.0–10.5)

## 2015-09-21 LAB — COMPREHENSIVE METABOLIC PANEL
ALK PHOS: 58 U/L (ref 38–126)
ALT: 37 U/L (ref 17–63)
AST: 38 U/L (ref 15–41)
Albumin: 2.3 g/dL — ABNORMAL LOW (ref 3.5–5.0)
Anion gap: 8 (ref 5–15)
BILIRUBIN TOTAL: 1.1 mg/dL (ref 0.3–1.2)
BUN: 6 mg/dL (ref 6–20)
CALCIUM: 7.6 mg/dL — AB (ref 8.9–10.3)
CO2: 28 mmol/L (ref 22–32)
CREATININE: 1.13 mg/dL (ref 0.61–1.24)
Chloride: 93 mmol/L — ABNORMAL LOW (ref 101–111)
GFR calc non Af Amer: >60 mL/min (ref >60)
Glucose, Bld: 101 mg/dL — ABNORMAL HIGH (ref 65–99)
Potassium: 2.5 mmol/L — CL (ref 3.5–5.1)
SODIUM: 129 mmol/L — AB (ref 135–145)
TOTAL PROTEIN: 5.7 g/dL — AB (ref 6.5–8.1)

## 2015-09-21 LAB — MAGNESIUM: Magnesium: 2.1 mg/dL (ref 1.7–2.4)

## 2015-09-21 MED ORDER — DOXYCYCLINE HYCLATE 50 MG PO CAPS: 100.0000 mg | ORAL_CAPSULE | Freq: Two times a day (BID) | ORAL | 0 refills | Status: DC

## 2015-09-21 MED ORDER — POTASSIUM CHLORIDE CRYS ER 20 MEQ PO TBCR
60.0000 meq | EXTENDED_RELEASE_TABLET | Freq: Once | ORAL | Status: AC
  Administered 2015-09-21: 60 meq via ORAL

## 2015-09-21 MED ORDER — POTASSIUM CHLORIDE CRYS ER 20 MEQ PO TBCR
30.0000 meq | EXTENDED_RELEASE_TABLET | ORAL | Status: DC
  Administered 2015-09-21: 30 meq via ORAL
  Filled 2015-09-21 (×2): qty 1

## 2015-09-21 MED ORDER — POTASSIUM CHLORIDE CRYS ER 20 MEQ PO TBCR
40.0000 meq | EXTENDED_RELEASE_TABLET | ORAL | Status: DC
  Filled 2015-09-21: qty 2

## 2015-09-21 MED ORDER — AMOXICILLIN-POT CLAVULANATE 875-125 MG PO TABS: 1.0000 | ORAL_TABLET | Freq: Two times a day (BID) | ORAL | 0 refills | Status: DC

## 2015-09-21 MED FILL — DOXYCYCLINE HYC 50 MG CAP: 50 | 7 days supply | Qty: 28 | Fill #0

## 2015-09-21 NOTE — Discharge Summary (Signed)
PATIENT DETAILS Name: Jared Reyes Age: 60 y.o. Sex: male Date of Birth: 04-27-1955 MRN: [id number]. Admitting Physician: Bobette Moavid Manuel Ortiz, MD QMV:HQIONGEPCP:FUNCHES, Ellison CarwinJOSALYN C, MD  Admit Date: 09/19/2015 Discharge date: 09/21/2015   Note-patient left AGAINST MEDICAL ADVICE.  Recommendations for Outpatient Follow-up:  1. Follow up with PCP and hand surgery (Dr. Orlan Leavensrtman) in 1 week  2. Avoid HCTZ-given hyponatremia and hypokalemia. 3. Please obtain BMP/CBC in one week 4. Follow blood cultures until final  Admitted From:  Home    Disposition: Home- AGAINST MEDICAL ADVICE   Home Health: No  Equipment/Devices: None  Discharge Condition: Stable-but not ready for discharge. Leaving AGAINST MEDICAL ADVICE  CODE STATUS: FULL CODE  Diet recommendation:  Heart Healthy  Brief Summary: See H&P, Labs, Consult and Test reports for all details in brief, patient is a 60 year old male with history of alcoholism, hypertension who presented to ED with worsening swelling of his right forearm. He was started on empiric antibiotics and admitted to the hospitalist service. Unfortunately, on 8/23 during my rounds, he expressed a desire to leave the hospital even though he was not medically stable. He was advised of the need to remain hospitalized so that stabilized his electrolytes, and keep on monitoring his his left upper extremity.he was warned of the life threatening and left disabling effects of hyperkalemia, and worsening left arm/forearm infection. He is well aware of these risks, and he still wanted to sign out AGAINST MEDICAL ADVICE. I have asked him to stay on doxycycline and Augmentin for at least a week, and ensure that he is seen by his primary care practitioner in 1 week. I have advised him TO see Dr. Melvyn Novasrtmann as well.   Brief Hospital Course: Right forearm cellulitis: Admitted and started on intravenous vancomycin and Zosyn. Seen by orthopedics, recommendations were to continue with  conservative management, and outpatient follow-up with Dr. Melvyn Novasrtmann. As noted above, patient decided to sign out AGAINST MEDICAL ADVICE on 8/23. Life threatening and life disabling risks of untreated soft tissue infection/hand infection explained to the patient, he understands risks and is accepting them. I have asked him to follow-up with Dr. Melvyn Novasrtmann. I have asked him to take both doxycycline and Augmentin for at least 1 week (note- prescription provided).  Hypokalemia: Likely secondary to alcohol use in HCTZ. He does not wish to remain in the hospital any longer to get potassium supplementation. He is insisting on being discharged. He is now signing out AGAINST MEDICAL ADVICE. Life threatening and life disabling risks of untreated hypokalemia explained to the patient, he is aware of risk of life-threatening cardiac arrhythmias.  Hypertension: Controlled with amlodipine-avoid HCTZ due to hyponatremia and hypokalemia  Hyponatremia: Probably secondary to HCTZ-very mild-continue outpatient follow-up/monitoring by PCP.  Seizure disorder: Continue Keppra  History of alcohol abuse: Claims he has not had a drink in the past 2 weeks-currently awake and alert-no tremors-and any other sign of alcohol withdrawal. Counseled   Procedures/Studies: None  Discharge Diagnoses:  Principal Problem:   Cellulitis of right upper extremity Active Problems:   Alcohol use (HCC)   Essential hypertension   Seizures (HCC)   Insomnia   Right forearm injury   Hypokalemia   Hyponatremia   Discharge Instructions:  Activity:  As tolerated   Discharge Instructions    Call MD for:    Complete by:  As directed   If swelling in the right arm/elbow area worsens, pain worsens, or if you have persistent fever.   Call MD for:  redness, tenderness,  or signs of infection (pain, swelling, redness, odor or green/yellow discharge around incision site)    Complete by:  As directed   Diet - low sodium heart healthy    Complete  by:  As directed   Increase activity slowly    Complete by:  As directed       Medication List    STOP taking these medications   hydrochlorothiazide 25 MG tablet Commonly known as:  HYDRODIURIL     TAKE these medications   acetaminophen-codeine 300-30 MG tablet Commonly known as:  TYLENOL #3 Take 1 tablet by mouth every 8 (eight) hours as needed for moderate pain.   amLODipine 10 MG tablet Commonly known as:  NORVASC Take 1 tablet (10 mg total) by mouth daily.   amoxicillin-clavulanate 875-125 MG tablet Commonly known as:  AUGMENTIN Take 1 tablet by mouth 2 (two) times daily.   atorvastatin 40 MG tablet Commonly known as:  LIPITOR Take 1 tablet (40 mg total) by mouth daily.   doxycycline 50 MG capsule Commonly known as:  VIBRAMYCIN Take 2 capsules (100 mg total) by mouth 2 (two) times daily.   levETIRAcetam 500 MG 24 hr tablet Commonly known as:  KEPPRA XR TAKE 2 TABLETS BY MOUTH DAILY. What changed:  See the new instructions.   potassium chloride 10 MEQ tablet Commonly known as:  K-DUR,KLOR-CON Take 1 tablet (10 mEq total) by mouth daily.   traZODone 100 MG tablet Commonly known as:  DESYREL Take 0.5-1 tablets (50-100 mg total) by mouth at bedtime.      Follow-up Information    Lora PaulaFUNCHES, JOSALYN C, MD. Go in 4 day(s).   Specialty:  Family Medicine Contact information: [address] [place] [address] [phone]        Sharma CovertTMANN,FRED W, MD. Go in 1 week(s).   Specialty:  Orthopedic Surgery Why:  For post hospital follow-up-to reexamine the right elbow Contact information: [address] [place] [address] [phone]          No Known Allergies    Consultations:   orthopedic surgery   Other Procedures/Studies: Dg Elbow Complete Right  Result Date: 09/19/2015 CLINICAL DATA:  Pain, swelling and erythema in the right elbow after recent cat bite to the wrist. EXAM: RIGHT ELBOW - COMPLETE 3+ VIEW COMPARISON:   None. FINDINGS: There is prominent soft tissue swelling about the right elbow. No fracture, joint effusion, dislocation or suspicious focal osseous lesion. No definite cortical erosions or periosteal reaction. Tiny enthesophytes are noted in close proximity to the medial and lateral epicondyles of the right distal humerus. IMPRESSION: Prominent diffuse right elbow soft tissue swelling. No fracture, joint effusion, malalignment or specific radiographic findings of osteomyelitis. Electronically Signed   By: Delbert PhenixJason A Poff M.D.   On: 09/19/2015 17:23      TODAY-DAY OF DISCHARGE:  Subjective:   Jared Reyes today has no headache,no chest abdominal pain,no new weakness tingling or numbness, feels much better wants to Leave immediately today-does not wish to remain hospitalized any longer.  Objective:   Blood pressure 128/81, pulse 93, temperature 97.8 F (36.6 C), temperature source Oral, resp. rate 18, height 5\' 8"  (1.727 m), weight 68.8 kg (151 lb 9.6 oz), SpO2 92 %.  Intake/Output Summary (Last 24 hours) at 09/21/15 1024 Last data filed at 09/21/15 1022  Gross per 24 hour  Intake             1182 ml  Output  3950 ml  Net            -2768 ml   Filed Weights   09/19/15 1637 09/19/15 2111 09/21/15 0613  Weight: 68 kg (150 lb) 67.9 kg (149 lb 12.8 oz) 68.8 kg (151 lb 9.6 oz)    Exam: Awake Alert, Oriented *3, No new F.N deficits, Normal affect [place].AT,PERRAL Supple Neck,No JVD, No cervical lymphadenopathy appriciated.  Symmetrical Chest wall movement, Good air movement bilaterally, CTAB RRR,No Gallops,Rubs or new Murmurs, No Parasternal Heave +ve B.Sounds, Abd Soft, Non tender, No organomegaly appriciated, No rebound -guarding or rigidity. No Cyanosis, Clubbing or edema, No new Rash or bruise   PERTINENT RADIOLOGIC STUDIES: Dg Elbow Complete Right  Result Date: 09/19/2015 CLINICAL DATA:  Pain, swelling and erythema in the right elbow after recent cat bite to the wrist.  EXAM: RIGHT ELBOW - COMPLETE 3+ VIEW COMPARISON:  None. FINDINGS: There is prominent soft tissue swelling about the right elbow. No fracture, joint effusion, dislocation or suspicious focal osseous lesion. No definite cortical erosions or periosteal reaction. Tiny enthesophytes are noted in close proximity to the medial and lateral epicondyles of the right distal humerus. IMPRESSION: Prominent diffuse right elbow soft tissue swelling. No fracture, joint effusion, malalignment or specific radiographic findings of osteomyelitis. Electronically Signed   By: Delbert Phenix M.D.   On: 09/19/2015 17:23     PERTINENT LAB RESULTS: CBC:  Recent Labs  09/20/15 0342 09/21/15 0420  WBC 13.3* 9.4  HGB 13.7 12.7*  HCT 37.8* 36.0*  PLT 233 259   CMET CMP     Component Value Date/Time   NA 129 (L) 09/21/2015 0420   K 2.5 (LL) 09/21/2015 0420   CL 93 (L) 09/21/2015 0420   CO2 28 09/21/2015 0420   GLUCOSE 101 (H) 09/21/2015 0420   BUN 6 09/21/2015 0420   CREATININE 1.13 09/21/2015 0420   CREATININE 0.77 07/26/2015 1629   CALCIUM 7.6 (L) 09/21/2015 0420   PROT 5.7 (L) 09/21/2015 0420   ALBUMIN 2.3 (L) 09/21/2015 0420   AST 38 09/21/2015 0420   ALT 37 09/21/2015 0420   ALKPHOS 58 09/21/2015 0420   BILITOT 1.1 09/21/2015 0420   GFRNONAA >60 09/21/2015 0420   GFRNONAA >89 07/26/2015 1629   GFRAA >60 09/21/2015 0420   GFRAA >89 07/26/2015 1629    GFR Estimated Creatinine Clearance: 67.3 mL/min (by C-G formula based on SCr of 1.13 mg/dL). No results for input(s): LIPASE, AMYLASE in the last 72 hours. No results for input(s): CKTOTAL, CKMB, CKMBINDEX, TROPONINI in the last 72 hours. Invalid input(s): POCBNP No results for input(s): DDIMER in the last 72 hours. No results for input(s): HGBA1C in the last 72 hours. No results for input(s): CHOL, HDL, LDLCALC, TRIG, CHOLHDL, LDLDIRECT in the last 72 hours. No results for input(s): TSH, T4TOTAL, T3FREE, THYROIDAB in the last 72 hours.  Invalid  input(s): FREET3 No results for input(s): VITAMINB12, FOLATE, FERRITIN, TIBC, IRON, RETICCTPCT in the last 72 hours. Coags: No results for input(s): INR in the last 72 hours.  Invalid input(s): PT Microbiology: Recent Results (from the past 240 hour(s))  Blood culture (routine x 2)     Status: None (Preliminary result)   Collection Time: 09/19/15  8:10 PM  Result Value Ref Range Status   Specimen Description BLOOD RIGHT ARM  Final   Special Requests BOTTLES DRAWN AEROBIC AND ANAEROBIC 5CC EA  Final   Culture NO GROWTH < 24 HOURS  Final   Report Status PENDING  Incomplete  Blood culture (routine x 2)     Status: None (Preliminary result)   Collection Time: 09/19/15  8:15 PM  Result Value Ref Range Status   Specimen Description BLOOD RIGHT HAND  Final   Special Requests BOTTLES DRAWN AEROBIC ONLY 2CC  Final   Culture NO GROWTH < 24 HOURS  Final   Report Status PENDING  Incomplete    FURTHER DISCHARGE INSTRUCTIONS:  Get Medicines reviewed and adjusted: Please take all your medications with you for your next visit with your Primary MD  Laboratory/radiological data: Please request your Primary MD to go over all hospital tests and procedure/radiological results at the follow up, please ask your Primary MD to get all Hospital records sent to his/her office.  In some cases, they will be blood work, cultures and biopsy results pending at the time of your discharge. Please request that your primary care M.D. goes through all the records of your hospital data and follows up on these results.  Also Note the following: If you experience worsening of your admission symptoms, develop shortness of breath, life threatening emergency, suicidal or homicidal thoughts you must seek medical attention immediately by calling 911 or calling your MD immediately  if symptoms less severe.  You must read complete instructions/literature along with all the possible adverse reactions/side effects for all the  Medicines you take and that have been prescribed to you. Take any new Medicines after you have completely understood and accpet all the possible adverse reactions/side effects.   Do not drive when taking Pain medications or sleeping medications (Benzodaizepines)  Do not take more than prescribed Pain, Sleep and Anxiety Medications. It is not advisable to combine anxiety,sleep and pain medications without talking with your primary care practitioner  Special Instructions: If you have smoked or chewed Tobacco  in the last 2 yrs please stop smoking, stop any regular Alcohol  and or any Recreational drug use.  Wear Seat belts while driving.  Please note: You were cared for by a hospitalist during your hospital stay. Once you are discharged, your primary care physician will handle any further medical issues. Please note that NO REFILLS for any discharge medications will be authorized once you are discharged, as it is imperative that you return to your primary care physician (or establish a relationship with a primary care physician if you do not have one) for your post hospital discharge needs so that they can reassess your need for medications and monitor your lab values.  Total Time spent coordinating discharge including counseling, education and face to face time equals  45 minutes.  SignedJeoffrey Massed 09/21/2015 10:24 AM

## 2015-09-21 NOTE — Progress Notes (Signed)
CRITICAL VALUE ALERT  Critical value received:  K 2.5  Date of notification:  09/21/15   Time of notification:  0455  Critical value read back:Yes.    Nurse who received alert:  Modena JanskyKevin Pheobe Sandiford RN  MD notified (1st page):  Velvet BatheKriby  Time of first page:  [phone]  MD notified (2nd page): NA  Time of second page: NA  Responding MD:  Craige CottaKirby  Time MD responded:  TBD

## 2015-09-21 NOTE — Progress Notes (Signed)
Pt has decided to leave AMA. Risks to leaving AMA explained to pt in full detail including possibility for sudden death. Pt is AOx4 and verbalized understanding but still wishes to sign AMA. AMA papers signed. IV removed. Telemetry removed. Pt walked off floor.  Jared NeatJessica Dalicia Kisner RN

## 2015-09-21 NOTE — Progress Notes (Signed)
Patient left AMA prior to being seen. Abelino DerrickB Zona Pedro [facility] FacilityRN,MHA,BSN [phone]

## 2015-09-21 NOTE — Progress Notes (Signed)
                                                         AMA  Patient at this time expresses desire to leave the Hospital immidiately, patient has been warned that this is not Medically advisable at this time, and can result in Medical complications like Death and Disability, patient understands and accepts the risks involved and assumes full responsibilty of this decision.   Jeoffrey MassedGHIMIRE,SHANKER M.D on 09/21/2015 at 10:20 AM  [facility]

## 2015-09-24 LAB — CULTURE, BLOOD (ROUTINE X 2)
CULTURE: NO GROWTH
Culture: NO GROWTH

## 2015-09-26 ENCOUNTER — Telehealth: Payer: Self-pay | Admitting: Family Medicine

## 2015-09-26 NOTE — Telephone Encounter (Signed)
Please call patient for hospital follow up appt He was treated for R arm cellulitis and electrolyte abnormalities and left against medical advice, he needs f.u for labs and to check cellulitis, please also ask and stress the importance of him taking the prescribed antibiotics, Augmentin and doxycyline

## 2015-09-26 NOTE — Telephone Encounter (Signed)
Pt was called on 8/28 and a voicemail was left for pt to return phone call. 

## 2015-10-04 ENCOUNTER — Ambulatory Visit: Payer: Self-pay | Attending: Family Medicine | Admitting: Family Medicine

## 2015-10-04 ENCOUNTER — Encounter: Payer: Self-pay | Admitting: Family Medicine

## 2015-10-04 VITALS — BP 124/84 | HR 103 | Temp 98.0°F | Wt 150.2 lb

## 2015-10-04 DIAGNOSIS — I10 Essential (primary) hypertension: Secondary | ICD-10-CM | POA: Insufficient documentation

## 2015-10-04 DIAGNOSIS — F17210 Nicotine dependence, cigarettes, uncomplicated: Secondary | ICD-10-CM | POA: Insufficient documentation

## 2015-10-04 DIAGNOSIS — E876 Hypokalemia: Secondary | ICD-10-CM | POA: Insufficient documentation

## 2015-10-04 DIAGNOSIS — L03113 Cellulitis of right upper limb: Secondary | ICD-10-CM | POA: Insufficient documentation

## 2015-10-04 NOTE — Patient Instructions (Addendum)
Arby was seen today for hospitalization follow-up.  Diagnoses and all orders for this visit:  Hypokalemia -     BASIC METABOLIC PANEL WITH GFR  Cellulitis of right upper extremity    F/u in 3 months for HTN   Dr. Armen PickupFunches

## 2015-10-04 NOTE — Assessment & Plan Note (Signed)
Improved, patient has completed antibiotics No redness or swelling With residual elbow stiffness and slight pain

## 2015-10-04 NOTE — Progress Notes (Signed)
Subjective:  Patient ID: Jared Reyes, male    DOB: [birth year]  Age: 60 y.o. MRN: [id number]  CC: Hospitalization Follow-up (right wrist sprain)   HPI Jared Reyes for   1. Hospital follow up R wrist cellulitis: he was hospitalized from 8/21-8/23/17. He was treated for R elbow cellulitis and electrolyte abnormalities, low sodium and potassium. He still has R elbow stiffness, R elbow pains. No redness. He completed doxycyline and Augmentin.   2. HTN: stopped HCTZ. Taking norvasc 10 mg daily. Drank 24 oz of beer yesterday. No HA, CP, SOB or leg swelling.   Social History  Substance Use Topics  . Smoking status: Current Every Day Smoker    Packs/day: 1.00    Types: Cigarettes  . Smokeless tobacco: Never Used  . Alcohol use 25.2 oz/week    42 Cans of beer per week     Comment: Last drank two weeks ago per patient.    Outpatient Medications Prior to Visit  Medication Sig Dispense Refill  . acetaminophen-codeine (TYLENOL #3) 300-30 MG tablet Take 1 tablet by mouth every 8 (eight) hours as needed for moderate pain. 60 tablet 2  . amLODipine (NORVASC) 10 MG tablet Take 1 tablet (10 mg total) by mouth daily. 90 tablet 3  . atorvastatin (LIPITOR) 40 MG tablet Take 1 tablet (40 mg total) by mouth daily. 90 tablet 3  . levETIRAcetam (KEPPRA XR) 500 MG 24 hr tablet TAKE 2 TABLETS BY MOUTH DAILY. (Patient taking differently: Take 500 mg by mouth two times a day) 60 tablet 2  . potassium chloride SA (K-DUR,KLOR-CON) 10 MEQ tablet Take 1 tablet (10 mEq total) by mouth daily. 30 tablet 0  . traZODone (DESYREL) 100 MG tablet Take 0.5-1 tablets (50-100 mg total) by mouth at bedtime. 30 tablet 2  . amoxicillin-clavulanate (AUGMENTIN) 875-125 MG tablet Take 1 tablet by mouth 2 (two) times daily. (Patient not taking: Reported on 10/04/2015) 14 tablet 0  . doxycycline (VIBRAMYCIN) 50 MG capsule Take 2 capsules (100 mg total) by mouth 2 (two) times daily. (Patient not taking: Reported on  10/04/2015) 28 capsule 0   No facility-administered medications prior to visit.     ROS Review of Systems  Constitutional: Negative for chills, fatigue, fever and unexpected weight change.  Eyes: Negative for visual disturbance.  Respiratory: Negative for cough and shortness of breath.   Cardiovascular: Negative for chest pain, palpitations and leg swelling.  Gastrointestinal: Negative for abdominal pain, blood in stool, constipation, diarrhea, nausea and vomiting.  Endocrine: Negative for polydipsia, polyphagia and polyuria.  Musculoskeletal: Positive for arthralgias (slight pain with stiffness in R elbow ). Negative for back pain, gait problem, myalgias and neck pain.  Skin: Negative for rash.  Allergic/Immunologic: Negative for immunocompromised state.  Hematological: Negative for adenopathy. Does not bruise/bleed easily.  Psychiatric/Behavioral: Negative for dysphoric mood, sleep disturbance and suicidal ideas. The patient is not nervous/anxious.     Objective:  BP 124/84   Pulse (!) 103   Temp 98 F (36.7 C) (Oral)   Wt 150 lb 3.2 oz (68.1 kg)   SpO2 98%   BMI 22.84 kg/m   BP/Weight 10/04/2015 09/21/2015 09/16/2015  Systolic BP 146 128 124  Diastolic BP 97 81 85  Wt. (Lbs) 150.2 151.6 151.4  BMI 22.84 23.05 23.02  Some encounter information is confidential and restricted. Go to Review Flowsheets activity to see all data.   Physical Exam  Constitutional: He appears well-developed and well-nourished. No distress.  HENT:  Head:  Normocephalic and atraumatic.  Neck: Normal range of motion. Neck supple.  Cardiovascular: Normal rate, regular rhythm, normal heart sounds and intact distal pulses.   Pulmonary/Chest: Effort normal and breath sounds normal.  Musculoskeletal: He exhibits no edema.       Right elbow: He exhibits decreased range of motion (decreased extension to 10 degreese ). He exhibits no swelling, no deformity and no laceration. Tenderness found. No radial head, no  medial epicondyle, no lateral epicondyle and no olecranon process tenderness noted.       Arms: Neurological: He is alert.  Skin: Skin is warm and dry. No rash noted. No erythema.  Psychiatric: He has a normal mood and affect.     Assessment & Plan:   There are no diagnoses linked to this encounter.  No orders of the defined types were placed in this encounter.   Follow-up: No Follow-up on file.   Dessa Phi MD

## 2015-10-04 NOTE — Assessment & Plan Note (Signed)
Well controlled on Norvasc.

## 2015-10-04 NOTE — Assessment & Plan Note (Signed)
Repeat BMP today to f/u potassium and sodium

## 2015-10-05 ENCOUNTER — Ambulatory Visit: Payer: Self-pay | Attending: Internal Medicine

## 2015-10-05 LAB — BASIC METABOLIC PANEL WITH GFR
BUN: 13 mg/dL (ref 7–25)
CHLORIDE: 100 mmol/L (ref 98–110)
CO2: 25 mmol/L (ref 20–31)
Calcium: 9.3 mg/dL (ref 8.6–10.3)
Creat: 1.08 mg/dL (ref 0.70–1.25)
GFR, EST NON AFRICAN AMERICAN: 74 mL/min (ref >=60)
GFR, Est African American: 86 mL/min (ref >=60)
GLUCOSE: 86 mg/dL (ref 65–99)
POTASSIUM: 4.9 mmol/L (ref 3.5–5.3)
Sodium: 135 mmol/L (ref 135–146)

## 2015-10-06 NOTE — Telephone Encounter (Signed)
Pt was called and informed of his results on 9/7.

## 2015-12-28 MED FILL — AMLODIPINE BESYLATE 10 MG T: 10 | 30 days supply | Qty: 30 | Fill #2

## 2015-12-28 MED FILL — ATORVASTATIN 40 MG TABLET: 40 | 30 days supply | Qty: 30 | Fill #5

## 2016-01-12 ENCOUNTER — Encounter: Payer: Self-pay | Admitting: Family Medicine

## 2016-01-12 ENCOUNTER — Ambulatory Visit: Payer: Self-pay | Attending: Family Medicine | Admitting: Family Medicine

## 2016-01-12 VITALS — BP 150/94 | HR 90 | Temp 98.1°F | Ht 68.0 in | Wt 162.8 lb

## 2016-01-12 DIAGNOSIS — K089 Disorder of teeth and supporting structures, unspecified: Secondary | ICD-10-CM | POA: Insufficient documentation

## 2016-01-12 DIAGNOSIS — Z8711 Personal history of peptic ulcer disease: Secondary | ICD-10-CM | POA: Insufficient documentation

## 2016-01-12 DIAGNOSIS — Z8673 Personal history of transient ischemic attack (TIA), and cerebral infarction without residual deficits: Secondary | ICD-10-CM | POA: Insufficient documentation

## 2016-01-12 DIAGNOSIS — M545 Low back pain, unspecified: Secondary | ICD-10-CM

## 2016-01-12 DIAGNOSIS — G8929 Other chronic pain: Secondary | ICD-10-CM | POA: Insufficient documentation

## 2016-01-12 DIAGNOSIS — I10 Essential (primary) hypertension: Secondary | ICD-10-CM | POA: Insufficient documentation

## 2016-01-12 DIAGNOSIS — Z79899 Other long term (current) drug therapy: Secondary | ICD-10-CM | POA: Insufficient documentation

## 2016-01-12 DIAGNOSIS — Z0000 Encounter for general adult medical examination without abnormal findings: Secondary | ICD-10-CM

## 2016-01-12 DIAGNOSIS — F17210 Nicotine dependence, cigarettes, uncomplicated: Secondary | ICD-10-CM | POA: Insufficient documentation

## 2016-01-12 MED ORDER — GLUCOSAMINE-CHONDROITIN 750-600 MG PO TABS: 2.0000 | ORAL_TABLET | Freq: Every day | ORAL | 0 refills | Status: DC

## 2016-01-12 MED ORDER — KETOROLAC TROMETHAMINE 60 MG/2ML IM SOLN
60.0000 mg | Freq: Once | INTRAMUSCULAR | Status: AC
  Administered 2016-01-12: 60 mg via INTRAMUSCULAR

## 2016-01-12 MED ORDER — TURMERIC 500 MG PO CAPS: 500.0000 mg | ORAL_CAPSULE | Freq: Every day | ORAL | Status: AC

## 2016-01-12 NOTE — Patient Instructions (Addendum)
Arjan was seen today for hypertension.  Diagnoses and all orders for this visit:  Essential hypertension  Chronic bilateral low back pain without sciatica -     ketorolac (TORADOL) injection 60 mg; Inject 2 mLs (60 mg total) into the muscle once. -     Turmeric 500 MG CAPS; Take 500 mg by mouth daily. -     Glucosamine-Chondroitin 750-600 MG TABS; Take 2 tablets by mouth daily.  Poor dentition -     Ambulatory referral to Dentistry   F/u in 6 weeks for HTN and chronic pain   Dr. Armen PickupFunches

## 2016-01-12 NOTE — Progress Notes (Signed)
Pt is here today to follow up on HTN.

## 2016-01-12 NOTE — Progress Notes (Signed)
Subjective:  Patient ID: Jared Reyes, male    DOB: 27-Dec-1955  Age: 60 y.o. MRN: [id number]  CC: Hypertension   HPI Jared CoinsGilmer C Blaisdell has HTN CVA, hx of alcohol abuse with chronic gastritis (2008)  and gastric ulcer (2010) he presents for   1. CHRONIC HYPERTENSION: compliant with Norvasc. Has chronic back pain. Not taking tylenol #3 due to lack of funds. Also not taking keppra. Denies seizures. Denies ETOH over last 2 months.  Denies HA, CP, blurry vision or leg swelling. Has poor dentition.    Social History  Substance Use Topics  . Smoking status: Current Every Day Smoker    Packs/day: 1.00    Types: Cigarettes  . Smokeless tobacco: Never Used  . Alcohol use 25.2 oz/week    42 Cans of beer per week     Comment: Last drank two weeks ago per patient.    Outpatient Medications Prior to Visit  Medication Sig Dispense Refill  . acetaminophen-codeine (TYLENOL #3) 300-30 MG tablet Take 1 tablet by mouth every 8 (eight) hours as needed for moderate pain. 60 tablet 2  . amLODipine (NORVASC) 10 MG tablet Take 1 tablet (10 mg total) by mouth daily. 90 tablet 3  . atorvastatin (LIPITOR) 40 MG tablet Take 1 tablet (40 mg total) by mouth daily. 90 tablet 3  . levETIRAcetam (KEPPRA XR) 500 MG 24 hr tablet TAKE 2 TABLETS BY MOUTH DAILY. (Patient taking differently: Take 500 mg by mouth two times a day) 60 tablet 2  . traZODone (DESYREL) 100 MG tablet Take 0.5-1 tablets (50-100 mg total) by mouth at bedtime. 30 tablet 2  . potassium chloride SA (K-DUR,KLOR-CON) 10 MEQ tablet Take 1 tablet (10 mEq total) by mouth daily. (Patient not taking: Reported on 01/12/2016) 30 tablet 0   No facility-administered medications prior to visit.     ROS Review of Systems  Constitutional: Negative for chills, fatigue, fever and unexpected weight change.  HENT: Positive for dental problem.   Eyes: Negative for visual disturbance.  Respiratory: Negative for cough and shortness of breath.   Cardiovascular:  Negative for chest pain, palpitations and leg swelling.  Gastrointestinal: Negative for abdominal pain, blood in stool, constipation, diarrhea, nausea and vomiting.  Endocrine: Negative for polydipsia, polyphagia and polyuria.  Musculoskeletal: Positive for arthralgias (slight pain with stiffness in R elbow ) and back pain. Negative for gait problem, myalgias and neck pain.  Skin: Negative for rash.  Allergic/Immunologic: Negative for immunocompromised state.  Hematological: Negative for adenopathy. Does not bruise/bleed easily.  Psychiatric/Behavioral: Negative for dysphoric mood, sleep disturbance and suicidal ideas. The patient is not nervous/anxious.     Objective:  BP (!) 150/94 (BP Location: Left Arm, Patient Position: Sitting, Cuff Size: Small)   Pulse 90   Temp 98.1 F (36.7 C) (Oral)   Ht 5\' 8"  (1.727 m)   Wt 162 lb 12.8 oz (73.8 kg)   SpO2 100%   BMI 24.75 kg/m   BP/Weight 01/12/2016 10/04/2015 09/21/2015  Systolic BP 150 124 128  Diastolic BP 94 84 81  Wt. (Lbs) 162.8 150.2 151.6  BMI 24.75 22.84 23.05  Some encounter information is confidential and restricted. Go to Review Flowsheets activity to see all data.     Physical Exam  Constitutional: He appears well-developed and well-nourished. No distress.  HENT:  Head: Normocephalic and atraumatic.  Mouth/Throat: Abnormal dentition.  Neck: Normal range of motion. Neck supple.  Cardiovascular: Normal rate, regular rhythm, normal heart sounds and intact distal pulses.  Pulmonary/Chest: Effort normal and breath sounds normal.  Musculoskeletal: He exhibits no edema.  Neurological: He is alert.  Skin: Skin is warm and dry. No rash noted. No erythema.  Psychiatric: He has a normal mood and affect.   toradol 60 mg IM x one  Assessment & Plan:  Garen was seen today for hypertension.  Diagnoses and all orders for this visit:  Essential hypertension  Chronic bilateral low back pain without sciatica -     ketorolac  (TORADOL) injection 60 mg; Inject 2 mLs (60 mg total) into the muscle once. -     Turmeric 500 MG CAPS; Take 500 mg by mouth daily. -     Glucosamine-Chondroitin 750-600 MG TABS; Take 2 tablets by mouth daily.  Poor dentition -     Ambulatory referral to Dentistry  Healthcare maintenance -     Ambulatory referral to Gastroenterology  There are no diagnoses linked to this encounter.  No orders of the defined types were placed in this encounter.   Follow-up: Return in about 6 weeks (around 02/23/2016) for HTN and chronic pain .   Dessa PhiJosalyn Dariane Natzke MD

## 2016-01-13 MED FILL — traZODone HCL 100 MG TABS: 100 | 30 days supply | Qty: 30 | Fill #1

## 2016-01-13 MED FILL — LEVETIRACETAM ER 500 MG TAB: 500 | 30 days supply | Qty: 60 | Fill #1

## 2016-01-13 MED FILL — ACETAMINOPHEN/COD #3 TABLET: 300-30 | 20 days supply | Qty: 60 | Fill #1

## 2016-01-15 NOTE — Assessment & Plan Note (Signed)
Elevated, patient endorses back pain Continue norvasc Treat back pain Close f/u for recheck

## 2016-01-15 NOTE — Assessment & Plan Note (Signed)
Chronic pain toradol shot today Patient advised to refill tylenol #3 when he has the money Add tumeric and glucosamine chondroitin

## 2016-01-31 MED FILL — AMLODIPINE BESYLATE 10 MG T: 10 | 30 days supply | Qty: 30 | Fill #3

## 2016-01-31 MED FILL — ATORVASTATIN 40 MG TABLET: 40 | 30 days supply | Qty: 30 | Fill #6

## 2016-02-07 ENCOUNTER — Ambulatory Visit: Payer: Self-pay | Attending: Family Medicine | Admitting: Family Medicine

## 2016-02-07 ENCOUNTER — Encounter: Payer: Self-pay | Admitting: Family Medicine

## 2016-02-07 VITALS — BP 115/76 | HR 103 | Temp 98.3°F | Ht 68.0 in | Wt 168.6 lb

## 2016-02-07 DIAGNOSIS — K089 Disorder of teeth and supporting structures, unspecified: Secondary | ICD-10-CM

## 2016-02-07 DIAGNOSIS — Z8711 Personal history of peptic ulcer disease: Secondary | ICD-10-CM | POA: Insufficient documentation

## 2016-02-07 DIAGNOSIS — M545 Low back pain, unspecified: Secondary | ICD-10-CM

## 2016-02-07 DIAGNOSIS — G4709 Other insomnia: Secondary | ICD-10-CM | POA: Insufficient documentation

## 2016-02-07 DIAGNOSIS — Z8673 Personal history of transient ischemic attack (TIA), and cerebral infarction without residual deficits: Secondary | ICD-10-CM | POA: Insufficient documentation

## 2016-02-07 DIAGNOSIS — F17210 Nicotine dependence, cigarettes, uncomplicated: Secondary | ICD-10-CM | POA: Insufficient documentation

## 2016-02-07 DIAGNOSIS — I10 Essential (primary) hypertension: Secondary | ICD-10-CM | POA: Insufficient documentation

## 2016-02-07 DIAGNOSIS — K0889 Other specified disorders of teeth and supporting structures: Secondary | ICD-10-CM | POA: Insufficient documentation

## 2016-02-07 DIAGNOSIS — G8929 Other chronic pain: Secondary | ICD-10-CM | POA: Insufficient documentation

## 2016-02-07 DIAGNOSIS — K4091 Unilateral inguinal hernia, without obstruction or gangrene, recurrent: Secondary | ICD-10-CM

## 2016-02-07 DIAGNOSIS — Z79899 Other long term (current) drug therapy: Secondary | ICD-10-CM | POA: Insufficient documentation

## 2016-02-07 DIAGNOSIS — K4090 Unilateral inguinal hernia, without obstruction or gangrene, not specified as recurrent: Secondary | ICD-10-CM | POA: Insufficient documentation

## 2016-02-07 MED ORDER — AMLODIPINE BESYLATE 10 MG PO TABS: 10.0000 mg | ORAL_TABLET | Freq: Every day | ORAL | 5 refills | Status: DC

## 2016-02-07 MED ORDER — DOCUSATE SODIUM 100 MG PO CAPS: 100.0000 mg | ORAL_CAPSULE | Freq: Every day | ORAL | 5 refills | Status: AC

## 2016-02-07 MED ORDER — TRAZODONE HCL 100 MG PO TABS: 100.0000 mg | ORAL_TABLET | Freq: Every day | ORAL | 5 refills | Status: DC

## 2016-02-07 NOTE — Assessment & Plan Note (Signed)
Well controlled. Continue norvasc.  

## 2016-02-07 NOTE — Assessment & Plan Note (Signed)
Advised patient apply for medicaid and disability in order to have access to surgery for repair

## 2016-02-07 NOTE — Progress Notes (Signed)
Subjective:  Patient ID: Jared Reyes, male    DOB: 02-04-1955  Age: 61 y.o. MRN: [id number]  CC: Hypertension   HPI Jared Reyes has HTN CVA, hx of alcohol abuse with chronic gastritis (2008)  and gastric ulcer (2010) he presents for   1. CHRONIC HYPERTENSION: compliant with Norvasc. H  Also not taking keppra. Denies seizures. Denies ETOH over last 2 months.  Denies HA, CP, blurry vision or leg swelling. Has poor dentition. Smoking 1 cigarette per day.    2. Chronic back pain. Taking tylenol #3 for pain. Having pain in R leg after walking or prolonged sitting.  No weakness in his R leg. He has a large inguinal hernia since 2009/2010 that he has not been able to afford to get repaired.  Current level of pain is 5/10.   Social History  Substance Use Topics  . Smoking status: Current Every Day Smoker    Packs/day: 1.00    Types: Cigarettes  . Smokeless tobacco: Never Used  . Alcohol use 25.2 oz/week    42 Cans of beer per week     Comment: Last drank two weeks ago per patient.    Outpatient Medications Prior to Visit  Medication Sig Dispense Refill  . acetaminophen-codeine (TYLENOL #3) 300-30 MG tablet Take 1 tablet by mouth every 8 (eight) hours as needed for moderate pain. 60 tablet 2  . amLODipine (NORVASC) 10 MG tablet Take 1 tablet (10 mg total) by mouth daily. 90 tablet 3  . atorvastatin (LIPITOR) 40 MG tablet Take 1 tablet (40 mg total) by mouth daily. 90 tablet 3  . Glucosamine-Chondroitin 750-600 MG TABS Take 2 tablets by mouth daily.  0  . levETIRAcetam (KEPPRA XR) 500 MG 24 hr tablet TAKE 2 TABLETS BY MOUTH DAILY. (Patient taking differently: Take 500 mg by mouth two times a day) 60 tablet 2  . traZODone (DESYREL) 100 MG tablet Take 0.5-1 tablets (50-100 mg total) by mouth at bedtime. 30 tablet 2  . Turmeric 500 MG CAPS Take 500 mg by mouth daily.     No facility-administered medications prior to visit.     ROS Review of Systems  Constitutional: Negative for  chills, fatigue, fever and unexpected weight change.  HENT: Positive for dental problem.   Eyes: Negative for visual disturbance.  Respiratory: Negative for cough and shortness of breath.   Cardiovascular: Negative for chest pain, palpitations and leg swelling.  Gastrointestinal: Positive for constipation. Negative for abdominal pain, blood in stool, diarrhea, nausea and vomiting.  Endocrine: Negative for polydipsia, polyphagia and polyuria.  Musculoskeletal: Positive for arthralgias (slight pain with stiffness in R elbow ) and back pain. Negative for gait problem, myalgias and neck pain.  Skin: Negative for rash.  Allergic/Immunologic: Negative for immunocompromised state.  Hematological: Negative for adenopathy. Does not bruise/bleed easily.  Psychiatric/Behavioral: Negative for dysphoric mood, sleep disturbance and suicidal ideas. The patient is not nervous/anxious.     Objective:  BP 115/76 (BP Location: Left Arm, Patient Position: Sitting, Cuff Size: Small)   Pulse (!) 103   Temp 98.3 F (36.8 C) (Oral)   Ht 5\' 8"  (1.727 m)   Wt 168 lb 9.6 oz (76.5 kg)   SpO2 96%   BMI 25.64 kg/m   BP/Weight 02/07/2016 01/12/2016 10/04/2015  Systolic BP 115 150 124  Diastolic BP 76 94 84  Wt. (Lbs) 168.6 162.8 150.2  BMI 25.64 24.75 22.84  Some encounter information is confidential and restricted. Go to Review Flowsheets activity to  see all data.     Physical Exam  Constitutional: He appears well-developed and well-nourished. No distress.  HENT:  Head: Normocephalic and atraumatic.  Mouth/Throat: Abnormal dentition.  Neck: Normal range of motion. Neck supple.  Cardiovascular: Normal rate, regular rhythm, normal heart sounds and intact distal pulses.   Pulmonary/Chest: Effort normal and breath sounds normal.  Abdominal: A hernia is present. Hernia confirmed positive in the right inguinal area.    Musculoskeletal: He exhibits no edema.  Neurological: He is alert.  Skin: Skin is warm and  dry. No rash noted. No erythema.  Psychiatric: He has a normal mood and affect.    Assessment & Plan:  Jared Reyes was seen today for hypertension.  Diagnoses and all orders for this visit:  Essential hypertension -     amLODipine (NORVASC) 10 MG tablet; Take 1 tablet (10 mg total) by mouth daily.  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  Chronic bilateral low back pain without sciatica -     docusate sodium (COLACE) 100 MG capsule; Take 1 capsule (100 mg total) by mouth daily.    No orders of the defined types were placed in this encounter.   Follow-up: Return in about 3 months (around 05/07/2016) for HTN .   Dessa PhiJosalyn Kammie Scioli MD

## 2016-02-07 NOTE — Patient Instructions (Addendum)
Espn was seen today for hypertension.  Diagnoses and all orders for this visit:  Essential hypertension -     amLODipine (NORVASC) 10 MG tablet; Take 1 tablet (10 mg total) by mouth daily.  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  Chronic bilateral low back pain without sciatica -     docusate sodium (COLACE) 100 MG capsule; Take 1 capsule (100 mg total) by mouth daily.   Apply for disability and medicaid on basis on chronic back pain and large inguinal hernia   F/u in 3 months for HTN  Dr. Armen PickupFunches

## 2016-02-17 ENCOUNTER — Encounter: Payer: Self-pay | Admitting: Family Medicine

## 2016-04-17 ENCOUNTER — Ambulatory Visit: Payer: Self-pay | Attending: Family Medicine

## 2016-05-07 ENCOUNTER — Other Ambulatory Visit: Payer: Self-pay | Admitting: Family Medicine

## 2016-05-07 DIAGNOSIS — Z8673 Personal history of transient ischemic attack (TIA), and cerebral infarction without residual deficits: Secondary | ICD-10-CM

## 2016-05-07 MED FILL — ?ATORVASTATIN 40MG TABLET: 40 | 30 days supply | Qty: 30 | Fill #0

## 2016-05-07 MED FILL — AMLODIPINE BESYLATE 10 MG T: 10 | 30 days supply | Qty: 30 | Fill #0

## 2016-05-07 MED FILL — traZODone HCL 100 MG TABS: 100 | 30 days supply | Qty: 30 | Fill #0

## 2016-05-08 ENCOUNTER — Encounter: Payer: Self-pay | Admitting: Family Medicine

## 2016-05-08 ENCOUNTER — Ambulatory Visit: Payer: Self-pay | Attending: Family Medicine | Admitting: Family Medicine

## 2016-05-08 VITALS — BP 157/98 | HR 107 | Temp 98.0°F | Ht 68.0 in | Wt 167.4 lb

## 2016-05-08 DIAGNOSIS — Z8673 Personal history of transient ischemic attack (TIA), and cerebral infarction without residual deficits: Secondary | ICD-10-CM | POA: Insufficient documentation

## 2016-05-08 DIAGNOSIS — R569 Unspecified convulsions: Secondary | ICD-10-CM | POA: Insufficient documentation

## 2016-05-08 DIAGNOSIS — Z8711 Personal history of peptic ulcer disease: Secondary | ICD-10-CM | POA: Insufficient documentation

## 2016-05-08 DIAGNOSIS — K4090 Unilateral inguinal hernia, without obstruction or gangrene, not specified as recurrent: Secondary | ICD-10-CM | POA: Insufficient documentation

## 2016-05-08 DIAGNOSIS — J40 Bronchitis, not specified as acute or chronic: Secondary | ICD-10-CM | POA: Insufficient documentation

## 2016-05-08 DIAGNOSIS — M545 Low back pain: Secondary | ICD-10-CM | POA: Insufficient documentation

## 2016-05-08 DIAGNOSIS — F17210 Nicotine dependence, cigarettes, uncomplicated: Secondary | ICD-10-CM | POA: Insufficient documentation

## 2016-05-08 DIAGNOSIS — G4709 Other insomnia: Secondary | ICD-10-CM | POA: Insufficient documentation

## 2016-05-08 DIAGNOSIS — I10 Essential (primary) hypertension: Secondary | ICD-10-CM | POA: Insufficient documentation

## 2016-05-08 DIAGNOSIS — Z79899 Other long term (current) drug therapy: Secondary | ICD-10-CM | POA: Insufficient documentation

## 2016-05-08 DIAGNOSIS — G8929 Other chronic pain: Secondary | ICD-10-CM | POA: Insufficient documentation

## 2016-05-08 MED ORDER — TRAZODONE HCL 100 MG PO TABS: 100.0000 mg | ORAL_TABLET | Freq: Every day | ORAL | 5 refills | Status: DC

## 2016-05-08 MED ORDER — ATORVASTATIN CALCIUM 40 MG PO TABS: 40.0000 mg | ORAL_TABLET | Freq: Every day | ORAL | 3 refills | Status: DC

## 2016-05-08 MED ORDER — DOXYCYCLINE HYCLATE 100 MG PO TABS: 100.0000 mg | ORAL_TABLET | Freq: Two times a day (BID) | ORAL | 0 refills | Status: DC

## 2016-05-08 MED ORDER — LEVETIRACETAM ER 500 MG PO TB24: 1000.0000 mg | ORAL_TABLET | Freq: Every day | ORAL | 5 refills | Status: DC

## 2016-05-08 MED ORDER — ACETAMINOPHEN-CODEINE #3 300-30 MG PO TABS: 1.0000 | ORAL_TABLET | Freq: Three times a day (TID) | ORAL | 2 refills | Status: DC | PRN

## 2016-05-08 MED ORDER — AMLODIPINE BESYLATE 10 MG PO TABS: 10.0000 mg | ORAL_TABLET | Freq: Every day | ORAL | 5 refills | Status: DC

## 2016-05-08 NOTE — Patient Instructions (Addendum)
Jared Reyes was seen today for hypertension.  Diagnoses and all orders for this visit:  Seizures (HCC) -     levETIRAcetam (KEPPRA XR) 500 MG 24 hr tablet; Take 2 tablets (1,000 mg total) by mouth daily.  Essential hypertension -     amLODipine (NORVASC) 10 MG tablet; Take 1 tablet (10 mg total) by mouth daily.  History of stroke -     atorvastatin (LIPITOR) 40 MG tablet; Take 1 tablet (40 mg total) by mouth daily.  Chronic low back pain, unspecified back pain laterality, with sciatica presence unspecified -     acetaminophen-codeine (TYLENOL #3) 300-30 MG tablet; Take 1 tablet by mouth every 8 (eight) hours as needed for moderate pain.  Other insomnia -     traZODone (DESYREL) 100 MG tablet; Take 1 tablet (100 mg total) by mouth at bedtime.  Bronchitis -     doxycycline (VIBRA-TABS) 100 MG tablet; Take 1 tablet (100 mg total) by mouth 2 (two) times daily.   Return in 4 weeks for BP check with nurse  Follow up with me in 2 months for HTN  Dr. Armen Pickup

## 2016-05-08 NOTE — Progress Notes (Signed)
Subjective:  Patient ID: Jared Reyes, male    DOB: May 17, 1955  Age: 61 y.o. MRN: [id number]  CC: Hypertension   HPI WEBBER MICHIELS has HTN CVA, hx of alcohol abuse with chronic gastritis (2008)  and gastric ulcer (2010) he presents for   1. CHRONIC HYPERTENSION: out of Norvasc for about 2 weeks. Having some headache, see below. No chest pain. No swelling in legs.   2. Chronic back pain. Taking tylenol #3 for pain. Having pain in R leg after walking or prolonged sitting.  No weakness in his R leg. He has a large inguinal hernia since 2009/2010 that he has not been able to afford to get repaired.  Current level of pain is 5/10.   3. Chest congestion: started on week ago. Productive cough. Also with headache. Has productive cough. Improves with coughing. No fever or chills. Overall has improved. He is a smoker. He smokes 2-3 cigs per day. He has some shortness of breath first thing in AM.   Social History  Substance Use Topics  . Smoking status: Current Every Day Smoker    Packs/day: 0.00    Types: Cigarettes  . Smokeless tobacco: Never Used  . Alcohol use 25.2 oz/week    42 Cans of beer per week     Comment: Last drank two weeks ago per patient.    Outpatient Medications Prior to Visit  Medication Sig Dispense Refill  . acetaminophen-codeine (TYLENOL #3) 300-30 MG tablet Take 1 tablet by mouth every 8 (eight) hours as needed for moderate pain. 60 tablet 2  . amLODipine (NORVASC) 10 MG tablet Take 1 tablet (10 mg total) by mouth daily. 30 tablet 5  . atorvastatin (LIPITOR) 40 MG tablet TAKE 1 TABLET BY MOUTH DAILY. 90 tablet 3  . docusate sodium (COLACE) 100 MG capsule Take 1 capsule (100 mg total) by mouth daily. 30 capsule 5  . levETIRAcetam (KEPPRA XR) 500 MG 24 hr tablet TAKE 2 TABLETS BY MOUTH DAILY. (Patient taking differently: Take 500 mg by mouth two times a day) 60 tablet 2  . traZODone (DESYREL) 100 MG tablet Take 1 tablet (100 mg total) by mouth at bedtime. 30 tablet 5   . Glucosamine-Chondroitin 750-600 MG TABS Take 2 tablets by mouth daily. (Patient not taking: Reported on 02/07/2016)  0  . Turmeric 500 MG CAPS Take 500 mg by mouth daily. (Patient not taking: Reported on 02/07/2016)     No facility-administered medications prior to visit.     ROS Review of Systems  Constitutional: Negative for chills, fatigue, fever and unexpected weight change.  Eyes: Negative for visual disturbance.  Respiratory: Negative for cough and shortness of breath.   Cardiovascular: Negative for chest pain, palpitations and leg swelling.  Gastrointestinal: Positive for constipation. Negative for abdominal pain, blood in stool, diarrhea, nausea and vomiting.  Endocrine: Negative for polydipsia, polyphagia and polyuria.  Musculoskeletal: Positive for arthralgias (slight pain with stiffness in R elbow ) and back pain. Negative for gait problem, myalgias and neck pain.  Skin: Negative for rash.  Allergic/Immunologic: Negative for immunocompromised state.  Hematological: Negative for adenopathy. Does not bruise/bleed easily.  Psychiatric/Behavioral: Negative for dysphoric mood, sleep disturbance and suicidal ideas. The patient is not nervous/anxious.     Objective:  BP (!) 157/98   Pulse (!) 107   Temp 98 F (36.7 C) (Oral)   Ht  (1.727 m)   Wt 167 lb 6.4 oz (75.9 kg)   SpO2 95%   BMI 25.45  kg/m   BP/Weight 05/08/2016 02/07/2016 01/12/2016  Systolic BP 157 115 150  Diastolic BP 98 76 94  Wt. (Lbs) 167.4 168.6 162.8  BMI 25.45 25.64 24.75  Some encounter information is confidential and restricted. Go to Review Flowsheets activity to see all data.   Pulse Readings from Last 3 Encounters:  05/08/16 (!) 107  02/07/16 (!) 103  01/12/16 90   Physical Exam  Constitutional: He appears well-developed and well-nourished. No distress.  HENT:  Head: Normocephalic and atraumatic.  Right Ear: Tympanic membrane, external ear and ear canal normal.  Left Ear: Tympanic membrane,  external ear and ear canal normal.  Nose: Right sinus exhibits no maxillary sinus tenderness and no frontal sinus tenderness. Left sinus exhibits no maxillary sinus tenderness and no frontal sinus tenderness.  Mouth/Throat: Oropharynx is clear and moist. Abnormal dentition.  Neck: Normal range of motion. Neck supple.  Cardiovascular: Normal rate, regular rhythm, normal heart sounds and intact distal pulses.   Pulmonary/Chest: Effort normal and breath sounds normal.  Abdominal: A hernia is present. Hernia confirmed positive in the right inguinal area.    Musculoskeletal: He exhibits no edema.  Neurological: He is alert.  Skin: Skin is warm and dry. No rash noted. No erythema.  Psychiatric: He has a normal mood and affect.    Assessment & Plan:  Madsen was seen today for hypertension.  Diagnoses and all orders for this visit:  Seizures (HCC) -     levETIRAcetam (KEPPRA XR) 500 MG 24 hr tablet; Take 2 tablets (1,000 mg total) by mouth daily.  Essential hypertension -     amLODipine (NORVASC) 10 MG tablet; Take 1 tablet (10 mg total) by mouth daily.  History of stroke -     atorvastatin (LIPITOR) 40 MG tablet; Take 1 tablet (40 mg total) by mouth daily.  Chronic low back pain, unspecified back pain laterality, with sciatica presence unspecified -     acetaminophen-codeine (TYLENOL #3) 300-30 MG tablet; Take 1 tablet by mouth every 8 (eight) hours as needed for moderate pain.  Other insomnia -     traZODone (DESYREL) 100 MG tablet; Take 1 tablet (100 mg total) by mouth at bedtime.  Bronchitis -     doxycycline (VIBRA-TABS) 100 MG tablet; Take 1 tablet (100 mg total) by mouth 2 (two) times daily.   No orders of the defined types were placed in this encounter.   Follow-up: Return in about 4 weeks (around 06/05/2016) for BP check with RN .   Dessa Phi MD

## 2016-05-08 NOTE — Assessment & Plan Note (Signed)
A: HTN with tachycardia Med; none for 3 weeks P:  Restart norvasc 10 mg daily

## 2016-05-08 NOTE — Assessment & Plan Note (Signed)
A: cough and chest congestion x 1 weeks in smoker with mild wheezing. No hypoxia. Suspect mild bronchitis P: Doxycyline 100 mg BID for 10 days

## 2016-05-09 MED FILL — DOXYCYCLINE 100 MG TABLET: 100 | 10 days supply | Qty: 20 | Fill #0

## 2016-05-09 MED FILL — ACETAMINOPHEN/COD #3 TABLET: 300-30 | 20 days supply | Qty: 60 | Fill #0

## 2016-05-09 MED FILL — LEVETIRACETAM ER 500 MG TAB: 500 | 30 days supply | Qty: 60 | Fill #0

## 2016-05-23 ENCOUNTER — Ambulatory Visit: Payer: Self-pay | Attending: Family Medicine | Admitting: *Deleted

## 2016-05-23 VITALS — BP 132/80 | HR 95 | Resp 16

## 2016-05-23 DIAGNOSIS — I10 Essential (primary) hypertension: Secondary | ICD-10-CM | POA: Insufficient documentation

## 2016-05-23 DIAGNOSIS — Z79899 Other long term (current) drug therapy: Secondary | ICD-10-CM | POA: Insufficient documentation

## 2016-05-23 NOTE — Progress Notes (Signed)
Pt here for f/u BP check after office visit on 05/08/16. Pt denies chest pain, SOB, HA, new vison concerns, or generalized swelling. He states he is taking medications  as prescribed.   Blood pressure taken manually while patient is sitting. BP reading:  130/80 and 132/80.

## 2016-06-06 MED FILL — ?ATORVASTATIN 40MG TABLET: 40 | 30 days supply | Qty: 30 | Fill #1

## 2016-06-06 MED FILL — AMLODIPINE BESYLATE 10 MG T: 10 | 30 days supply | Qty: 30 | Fill #1

## 2016-06-13 ENCOUNTER — Encounter: Payer: Self-pay | Admitting: Family Medicine

## 2016-07-05 MED FILL — ACETAMINOPHEN/COD #3 TABLET: 300-30 | 20 days supply | Qty: 60 | Fill #1

## 2016-07-05 MED FILL — LEVETIRACETAM ER 500 MG TAB: 500 | 30 days supply | Qty: 60 | Fill #1

## 2016-07-12 ENCOUNTER — Encounter: Payer: Self-pay | Admitting: Family Medicine

## 2016-07-12 ENCOUNTER — Ambulatory Visit: Payer: Self-pay | Attending: Family Medicine | Admitting: Family Medicine

## 2016-07-12 VITALS — BP 126/82 | HR 88 | Temp 98.1°F | Wt 160.6 lb

## 2016-07-12 DIAGNOSIS — M79604 Pain in right leg: Secondary | ICD-10-CM | POA: Insufficient documentation

## 2016-07-12 DIAGNOSIS — J410 Simple chronic bronchitis: Secondary | ICD-10-CM

## 2016-07-12 DIAGNOSIS — Z8673 Personal history of transient ischemic attack (TIA), and cerebral infarction without residual deficits: Secondary | ICD-10-CM

## 2016-07-12 DIAGNOSIS — G8929 Other chronic pain: Secondary | ICD-10-CM

## 2016-07-12 DIAGNOSIS — F17210 Nicotine dependence, cigarettes, uncomplicated: Secondary | ICD-10-CM | POA: Insufficient documentation

## 2016-07-12 DIAGNOSIS — G4700 Insomnia, unspecified: Secondary | ICD-10-CM | POA: Insufficient documentation

## 2016-07-12 DIAGNOSIS — M545 Low back pain: Secondary | ICD-10-CM | POA: Insufficient documentation

## 2016-07-12 DIAGNOSIS — K4091 Unilateral inguinal hernia, without obstruction or gangrene, recurrent: Secondary | ICD-10-CM

## 2016-07-12 DIAGNOSIS — K4090 Unilateral inguinal hernia, without obstruction or gangrene, not specified as recurrent: Secondary | ICD-10-CM | POA: Insufficient documentation

## 2016-07-12 DIAGNOSIS — R569 Unspecified convulsions: Secondary | ICD-10-CM

## 2016-07-12 DIAGNOSIS — G4709 Other insomnia: Secondary | ICD-10-CM

## 2016-07-12 DIAGNOSIS — I10 Essential (primary) hypertension: Secondary | ICD-10-CM | POA: Insufficient documentation

## 2016-07-12 MED ORDER — ACETAMINOPHEN-CODEINE #3 300-30 MG PO TABS: 1.0000 | ORAL_TABLET | Freq: Three times a day (TID) | ORAL | 2 refills | Status: DC | PRN

## 2016-07-12 MED ORDER — LEVETIRACETAM ER 500 MG PO TB24: 1000.0000 mg | ORAL_TABLET | Freq: Every day | ORAL | 5 refills | Status: DC

## 2016-07-12 MED ORDER — TRAZODONE HCL 100 MG PO TABS: 100.0000 mg | ORAL_TABLET | Freq: Every day | ORAL | 5 refills | Status: DC

## 2016-07-12 MED ORDER — TRAZODONE HCL 50 MG PO TABS: 75.0000 mg | ORAL_TABLET | Freq: Every day | ORAL | 5 refills | Status: DC

## 2016-07-12 MED ORDER — ALBUTEROL SULFATE HFA 108 (90 BASE) MCG/ACT IN AERS: 2.0000 | INHALATION_SPRAY | Freq: Four times a day (QID) | RESPIRATORY_TRACT | 0 refills | Status: DC | PRN

## 2016-07-12 NOTE — Patient Instructions (Addendum)
Jared Reyes was seen today for hypertension.  Diagnoses and all orders for this visit:  Chronic low back pain, unspecified back pain laterality, with sciatica presence unspecified -     acetaminophen-codeine (TYLENOL #3) 300-30 MG tablet; Take 1 tablet by mouth every 8 (eight) hours as needed for moderate pain.  Seizures (HCC) -     levETIRAcetam (KEPPRA XR) 500 MG 24 hr tablet; Take 2 tablets (1,000 mg total) by mouth daily.  Other insomnia -     Discontinue: traZODone (DESYREL) 100 MG tablet; Take 1 tablet (100 mg total) by mouth at bedtime. -     traZODone (DESYREL) 50 MG tablet; Take 1.5 tablets (75 mg total) by mouth at bedtime.  Unilateral recurrent inguinal hernia without obstruction or gangrene  Other orders -     albuterol (PROVENTIL HFA;VENTOLIN HFA) 108 (90 Base) MCG/ACT inhaler; Inhale 2 puffs into the lungs every 6 (six) hours as needed for wheezing or shortness of breath.   F/u in 3 months for HTN  Dr. Armen PickupFunches

## 2016-07-12 NOTE — Progress Notes (Signed)
Subjective:  Patient ID: Jared Reyes, male    DOB: 01/28/1956  Age: 61 y.o. MRN: [id number]  CC: Hypertension   HPI JARIUS Reyes has HTN,  CVA without deficit,  Seizures (last seizure 08/2014 in setting of insomnia), hx of alcohol abuse with chronic gastritis (2008)  and gastric ulcer (2010), large R inguinal hernia he presents for   1. CHRONIC HYPERTENSION: compliant with novasc.  No chest pain. No swelling in legs.   2. Chronic back pain. Taking tylenol #3 for pain. Having pain in R leg after walking or prolonged sitting.  No weakness in his R leg. He has a large inguinal hernia since 2009/2010 that he has not been able to afford to get repaired.  Current level of pain is 5/10.   3. Cough: persistent cough. Worse in early AM. Productive at times. No fever or chills. smoking 2-3 cigs per day. Has shortness of breath at times especially when in the heat.   4. R inguinal hernia: since 2008 or so. Has history of congential hernia repair at 68 weeks of age. Has pain and swelling. Area is not reducible. Pain with walking. He has not been able to afford the repair. He is uninsured. Tylenol #3 helps with the pain.   Social History  Substance Use Topics  . Smoking status: Current Every Day Smoker    Packs/day: 0.00    Types: Cigarettes  . Smokeless tobacco: Never Used  . Alcohol use 25.2 oz/week    42 Cans of beer per week     Comment: Last drank two weeks ago per patient.    Outpatient Medications Prior to Visit  Medication Sig Dispense Refill  . acetaminophen-codeine (TYLENOL #3) 300-30 MG tablet Take 1 tablet by mouth every 8 (eight) hours as needed for moderate pain. 60 tablet 2  . amLODipine (NORVASC) 10 MG tablet Take 1 tablet (10 mg total) by mouth daily. 30 tablet 5  . atorvastatin (LIPITOR) 40 MG tablet Take 1 tablet (40 mg total) by mouth daily. 90 tablet 3  . docusate sodium (COLACE) 100 MG capsule Take 1 capsule (100 mg total) by mouth daily. 30 capsule 5  . doxycycline  (VIBRA-TABS) 100 MG tablet Take 1 tablet (100 mg total) by mouth 2 (two) times daily. 20 tablet 0  . Glucosamine-Chondroitin 750-600 MG TABS Take 2 tablets by mouth daily.  0  . levETIRAcetam (KEPPRA XR) 500 MG 24 hr tablet Take 2 tablets (1,000 mg total) by mouth daily. 60 tablet 5  . traZODone (DESYREL) 100 MG tablet Take 1 tablet (100 mg total) by mouth at bedtime. 30 tablet 5  . Turmeric 500 MG CAPS Take 500 mg by mouth daily.     No facility-administered medications prior to visit.     ROS Review of Systems  Constitutional: Negative for chills, fatigue, fever and unexpected weight change.  Eyes: Negative for visual disturbance.  Respiratory: Negative for cough and shortness of breath.   Cardiovascular: Negative for chest pain, palpitations and leg swelling.  Gastrointestinal: Positive for constipation. Negative for abdominal pain, blood in stool, diarrhea, nausea and vomiting.  Endocrine: Negative for polydipsia, polyphagia and polyuria.  Musculoskeletal: Positive for arthralgias (slight pain with stiffness in R elbow ) and back pain. Negative for gait problem, myalgias and neck pain.  Skin: Negative for rash.  Allergic/Immunologic: Negative for immunocompromised state.  Neurological: Negative for seizures.  Hematological: Negative for adenopathy. Does not bruise/bleed easily.  Psychiatric/Behavioral: Negative for dysphoric mood, sleep disturbance and  suicidal ideas. The patient is not nervous/anxious.     Objective:  BP 126/82   Pulse 88   Temp 98.1 F (36.7 C) (Oral)   Wt 160 lb 9.6 oz (72.8 kg)   SpO2 97%   BMI 24.42 kg/m   BP/Weight 07/12/2016 05/23/2016 05/08/2016  Systolic BP 126 132 157  Diastolic BP 82 80 98  Wt. (Lbs) 160.6 - 167.4  BMI 24.42 - 25.45  Some encounter information is confidential and restricted. Go to Review Flowsheets activity to see all data.   Pulse Readings from Last 3 Encounters:  07/12/16 88  05/23/16 95  05/08/16 (!) 107   Physical Exam    Constitutional: He appears well-developed and well-nourished. No distress.  HENT:  Head: Normocephalic and atraumatic.  Right Ear: Tympanic membrane, external ear and ear canal normal.  Left Ear: Tympanic membrane, external ear and ear canal normal.  Nose: Right sinus exhibits no maxillary sinus tenderness and no frontal sinus tenderness. Left sinus exhibits no maxillary sinus tenderness and no frontal sinus tenderness.  Mouth/Throat: Oropharynx is clear and moist. Abnormal dentition.  Neck: Normal range of motion. Neck supple.  Cardiovascular: Normal rate, regular rhythm, normal heart sounds and intact distal pulses.   Pulmonary/Chest: Effort normal and breath sounds normal.  Abdominal: A hernia is present. Hernia confirmed positive in the right inguinal area.    Genitourinary: Right testis shows tenderness.  Musculoskeletal: He exhibits no edema.  Neurological: He is alert.  Skin: Skin is warm and dry. No rash noted. No erythema.  Psychiatric: He has a normal mood and affect.   Lab Results  Component Value Date   HGBA1C 4 8 03/11/2015    Assessment & Plan:  Benay PikeGilmer was seen today for hypertension.  Diagnoses and all orders for this visit:  Smokers' cough (HCC) -     albuterol (PROVENTIL HFA;VENTOLIN HFA) 108 (90 Base) MCG/ACT inhaler; Inhale 2 puffs into the lungs every 6 (six) hours as needed for wheezing or shortness of breath.  Chronic low back pain, unspecified back pain laterality, with sciatica presence unspecified -     acetaminophen-codeine (TYLENOL #3) 300-30 MG tablet; Take 1 tablet by mouth every 8 (eight) hours as needed for moderate pain.  Seizures (HCC) -     levETIRAcetam (KEPPRA XR) 500 MG 24 hr tablet; Take 2 tablets (1,000 mg total) by mouth daily.  Other insomnia -     Discontinue: traZODone (DESYREL) 100 MG tablet; Take 1 tablet (100 mg total) by mouth at bedtime. -     traZODone (DESYREL) 50 MG tablet; Take 1.5 tablets (75 mg total) by mouth at  bedtime.  Unilateral recurrent inguinal hernia without obstruction or gangrene -     acetaminophen-codeine (TYLENOL #3) 300-30 MG tablet; Take 1 tablet by mouth every 8 (eight) hours as needed for moderate pain.   No orders of the defined types were placed in this encounter.   Follow-up: Return in about 3 months (around 10/12/2016) for HTN.   Dessa PhiJosalyn Shekina Cordell MD

## 2016-07-12 NOTE — Assessment & Plan Note (Signed)
Chronic large R inguinal hernia Painful Non  Reducible  Since 2008

## 2016-07-13 MED FILL — !VENTOLIN HFA INHALER: 108 (90 BAS | 25 days supply | Qty: 18 | Fill #0

## 2016-07-13 MED FILL — ?TRAZODONE 50 MG TABLET: 50 | 30 days supply | Qty: 45 | Fill #0

## 2016-07-14 DIAGNOSIS — J410 Simple chronic bronchitis: Secondary | ICD-10-CM | POA: Insufficient documentation

## 2016-07-14 NOTE — Assessment & Plan Note (Signed)
seizure free since 09/17/14. Has had 2 seizures in his life. Last seizure was in the setting of insomnia. Patient has had insomnia for many years. He also has a history of alcohol abuse. He reports light drinking now. He is taking keppra. He is taking trazodone for insomnia.

## 2016-07-14 NOTE — Assessment & Plan Note (Signed)
Hx of CVA without deficit Taking statin Controlling BP with norvasc No ASA due to gastric ulcer hx

## 2016-07-16 MED FILL — AMLODIPINE BESYLATE 10 MG T: 10 | 30 days supply | Qty: 30 | Fill #2

## 2016-07-19 ENCOUNTER — Ambulatory Visit: Payer: Self-pay | Attending: Family Medicine

## 2016-07-19 ENCOUNTER — Other Ambulatory Visit: Payer: Self-pay

## 2016-07-19 MED ORDER — ATORVASTATIN CALCIUM 20 MG PO TABS: 40.0000 mg | ORAL_TABLET | Freq: Every day | ORAL | 3 refills | Status: DC

## 2016-07-19 MED FILL — ATORVASTATIN 20 MG TABLET: 20 | 30 days supply | Qty: 60 | Fill #0

## 2016-07-27 ENCOUNTER — Other Ambulatory Visit: Payer: Self-pay | Admitting: Family Medicine

## 2016-07-27 DIAGNOSIS — J410 Simple chronic bronchitis: Secondary | ICD-10-CM

## 2016-08-08 ENCOUNTER — Other Ambulatory Visit: Payer: Self-pay

## 2016-08-08 DIAGNOSIS — J410 Simple chronic bronchitis: Secondary | ICD-10-CM

## 2016-08-08 MED ORDER — ALBUTEROL SULFATE HFA 108 (90 BASE) MCG/ACT IN AERS: 2.0000 | INHALATION_SPRAY | Freq: Four times a day (QID) | RESPIRATORY_TRACT | 3 refills | Status: DC | PRN

## 2016-08-13 ENCOUNTER — Ambulatory Visit: Payer: Self-pay | Attending: Family Medicine | Admitting: Family Medicine

## 2016-08-13 ENCOUNTER — Encounter: Payer: Self-pay | Admitting: Family Medicine

## 2016-08-13 VITALS — BP 127/82 | HR 76 | Temp 98.0°F | Resp 18 | Ht 68.0 in | Wt 157.0 lb

## 2016-08-13 DIAGNOSIS — R569 Unspecified convulsions: Secondary | ICD-10-CM | POA: Insufficient documentation

## 2016-08-13 DIAGNOSIS — X58XXXA Exposure to other specified factors, initial encounter: Secondary | ICD-10-CM | POA: Insufficient documentation

## 2016-08-13 DIAGNOSIS — Z8673 Personal history of transient ischemic attack (TIA), and cerebral infarction without residual deficits: Secondary | ICD-10-CM | POA: Insufficient documentation

## 2016-08-13 DIAGNOSIS — Z79899 Other long term (current) drug therapy: Secondary | ICD-10-CM | POA: Insufficient documentation

## 2016-08-13 DIAGNOSIS — S025XXA Fracture of tooth (traumatic), initial encounter for closed fracture: Secondary | ICD-10-CM | POA: Insufficient documentation

## 2016-08-13 DIAGNOSIS — F17210 Nicotine dependence, cigarettes, uncomplicated: Secondary | ICD-10-CM | POA: Insufficient documentation

## 2016-08-13 DIAGNOSIS — G4700 Insomnia, unspecified: Secondary | ICD-10-CM | POA: Insufficient documentation

## 2016-08-13 DIAGNOSIS — K089 Disorder of teeth and supporting structures, unspecified: Secondary | ICD-10-CM

## 2016-08-13 NOTE — Patient Instructions (Addendum)
Shreyansh was seen today for other.  Diagnoses and all orders for this visit:  Poor dentition -     Ambulatory referral to Dentistry   Your DMV form will be completed and sent from here  F/u in 2 months for flu shot and HTN with new PCP, sooner if needed   Dr. Armen PickupFunches

## 2016-08-13 NOTE — Assessment & Plan Note (Signed)
Seizure free Drinking lightly DMV medical review form completed

## 2016-08-13 NOTE — Progress Notes (Signed)
Subjective:  Patient ID: Jared Reyes, male    DOB: Sep 18, 1955  Age: 61 y.o. MRN: [id number]  CC: Other (DMV)   HPI Jared Reyes has history of alcohol abuse, seizures, CVA without deficits he  presents for   1. DMV medical review form: this is completed yearly. He does not drive regularly. He does not drink alcohol regular. He reports 1 beer every 2 weeks or so. He denies seizures since his last one in 08/2014.   2. Dental problem: he has very poor dentition. He has broken teeth. No oral pain. He needs to see a dentist for extraction.   3. Insomnia: taking trazodone 75 mg nightly. Fatigue has resolved since decreasing the dose from 100 mg.   Social History  Substance Use Topics  . Smoking status: Current Every Day Smoker    Packs/day: 0.00    Types: Cigarettes  . Smokeless tobacco: Never Used  . Alcohol use 25.2 oz/week    42 Cans of beer per week     Comment: Last drank two weeks ago per patient.    Outpatient Medications Prior to Visit  Medication Sig Dispense Refill  . acetaminophen-codeine (TYLENOL #3) 300-30 MG tablet Take 1 tablet by mouth every 8 (eight) hours as needed for moderate pain. 60 tablet 2  . albuterol (VENTOLIN HFA) 108 (90 Base) MCG/ACT inhaler Inhale 2 puffs into the lungs every 6 (six) hours as needed for wheezing or shortness of breath. 54 g 3  . albuterol (VENTOLIN HFA) 108 (90 Base) MCG/ACT inhaler Inhale 2 puffs into the lungs every 6 (six) hours as needed for wheezing or shortness of breath. 54 g 3  . amLODipine (NORVASC) 10 MG tablet Take 1 tablet (10 mg total) by mouth daily. 30 tablet 5  . atorvastatin (LIPITOR) 20 MG tablet Take 2 tablets (40 mg total) by mouth daily. 60 tablet 3  . atorvastatin (LIPITOR) 40 MG tablet Take 1 tablet (40 mg total) by mouth daily. 90 tablet 3  . docusate sodium (COLACE) 100 MG capsule Take 1 capsule (100 mg total) by mouth daily. 30 capsule 5  . Glucosamine-Chondroitin 750-600 MG TABS Take 2 tablets by mouth  daily.  0  . levETIRAcetam (KEPPRA XR) 500 MG 24 hr tablet Take 2 tablets (1,000 mg total) by mouth daily. 60 tablet 5  . traZODone (DESYREL) 50 MG tablet Take 1.5 tablets (75 mg total) by mouth at bedtime. 45 tablet 5  . Turmeric 500 MG CAPS Take 500 mg by mouth daily.     No facility-administered medications prior to visit.     ROS Review of Systems  Constitutional: Negative for chills, fatigue, fever and unexpected weight change.  HENT: Positive for dental problem.   Eyes: Negative for visual disturbance.  Respiratory: Negative for cough and shortness of breath.   Cardiovascular: Negative for chest pain, palpitations and leg swelling.  Gastrointestinal: Negative for abdominal pain, blood in stool, constipation, diarrhea, nausea and vomiting.  Endocrine: Negative for polydipsia, polyphagia and polyuria.  Musculoskeletal: Negative for arthralgias, back pain, gait problem, myalgias and neck pain.  Skin: Negative for rash.  Allergic/Immunologic: Negative for immunocompromised state.  Hematological: Negative for adenopathy. Does not bruise/bleed easily.  Psychiatric/Behavioral: Negative for dysphoric mood, sleep disturbance and suicidal ideas. The patient is not nervous/anxious.     Objective:  BP 127/82 (BP Location: Left Arm, Patient Position: Sitting, Cuff Size: Normal)   Pulse 76   Temp 98 F (36.7 C) (Oral)   Resp 18  Ht 5\' 8"  (1.727 m)   Wt 157 lb (71.2 kg)   SpO2 98%   BMI 23.87 kg/m   BP/Weight 08/13/2016 07/12/2016 05/23/2016  Systolic BP 127 126 132  Diastolic BP 82 82 80  Wt. (Lbs) 157 160.6 -  BMI 23.87 24.42 -  Some encounter information is confidential and restricted. Go to Review Flowsheets activity to see all data.    Physical Exam  Constitutional: He appears well-developed and well-nourished. No distress.  HENT:  Head: Normocephalic and atraumatic.  Mouth/Throat: Abnormal dentition.  Neck: Normal range of motion. Neck supple.  Cardiovascular: Normal  rate, regular rhythm, normal heart sounds and intact distal pulses.   Pulmonary/Chest: Effort normal and breath sounds normal.  Musculoskeletal: He exhibits no edema.  Neurological: He is alert.  Skin: Skin is warm and dry. No rash noted. No erythema.  Psychiatric: He has a normal mood and affect.     Assessment & Plan:  Jared Reyes was seen today for other.  Diagnoses and all orders for this visit:  Poor dentition -     Ambulatory referral to Dentistry   There are no diagnoses linked to this encounter.  No orders of the defined types were placed in this encounter.   Follow-up: Return in about 2 months (around 10/14/2016) for HTN, flu shot .   Dessa Phi MD

## 2016-09-07 MED FILL — ATORVASTATIN 20 MG TABLET: 20 | 30 days supply | Qty: 60 | Fill #1

## 2016-09-07 MED FILL — LEVETIRACETAM ER 500 MG TAB: 500 | 30 days supply | Qty: 60 | Fill #2

## 2016-09-07 MED FILL — AMLODIPINE BESYLATE 10 MG T: 10 | 30 days supply | Qty: 30 | Fill #3

## 2016-10-11 ENCOUNTER — Telehealth: Payer: Self-pay | Admitting: Family Medicine

## 2016-10-11 NOTE — Telephone Encounter (Signed)
PT called  Since he would like to speak with the nurse or the pcp since the about [facility] , please follow urgent

## 2016-10-12 ENCOUNTER — Ambulatory Visit: Payer: Self-pay | Admitting: Internal Medicine

## 2016-10-15 ENCOUNTER — Ambulatory Visit: Payer: Self-pay | Attending: Internal Medicine | Admitting: Internal Medicine

## 2016-10-15 VITALS — BP 143/91 | HR 93 | Temp 98.9°F

## 2016-10-15 DIAGNOSIS — R569 Unspecified convulsions: Secondary | ICD-10-CM | POA: Insufficient documentation

## 2016-10-15 DIAGNOSIS — G8929 Other chronic pain: Secondary | ICD-10-CM

## 2016-10-15 DIAGNOSIS — M545 Low back pain: Secondary | ICD-10-CM

## 2016-10-15 DIAGNOSIS — F17210 Nicotine dependence, cigarettes, uncomplicated: Secondary | ICD-10-CM

## 2016-10-15 DIAGNOSIS — K4090 Unilateral inguinal hernia, without obstruction or gangrene, not specified as recurrent: Secondary | ICD-10-CM | POA: Insufficient documentation

## 2016-10-15 DIAGNOSIS — I10 Essential (primary) hypertension: Secondary | ICD-10-CM

## 2016-10-15 DIAGNOSIS — K4091 Unilateral inguinal hernia, without obstruction or gangrene, recurrent: Secondary | ICD-10-CM

## 2016-10-15 DIAGNOSIS — Z79899 Other long term (current) drug therapy: Secondary | ICD-10-CM | POA: Insufficient documentation

## 2016-10-15 NOTE — Patient Instructions (Addendum)
Steps to Quit Smoking Smoking tobacco can be bad for your health. It can also affect almost every organ in your body. Smoking puts you and people around you at risk for many serious long-lasting (chronic) diseases. Quitting smoking is hard, but it is one of the best things that you can do for your health. It is never too late to quit. What are the benefits of quitting smoking? When you quit smoking, you lower your risk for getting serious diseases and conditions. They can include:  Lung cancer or lung disease.  Heart disease.  Stroke.  Heart attack.  Not being able to have children (infertility).  Weak bones (osteoporosis) and broken bones (fractures).  If you have coughing, wheezing, and shortness of breath, those symptoms may get better when you quit. You may also get sick less often. If you are pregnant, quitting smoking can help to lower your chances of having a baby of low birth weight. What can I do to help me quit smoking? Talk with your doctor about what can help you quit smoking. Some things you can do (strategies) include:  Quitting smoking totally, instead of slowly cutting back how much you smoke over a period of time.  Going to in-person counseling. You are more likely to quit if you go to many counseling sessions.  Using resources and support systems, such as: ? Online chats with a counselor. ? Phone quitlines. ? Printed self-help materials. ? Support groups or group counseling. ? Text messaging programs. ? Mobile phone apps or applications.  Taking medicines. Some of these medicines may have nicotine in them. If you are pregnant or breastfeeding, do not take any medicines to quit smoking unless your doctor says it is okay. Talk with your doctor about counseling or other things that can help you.  Talk with your doctor about using more than one strategy at the same time, such as taking medicines while you are also going to in-person counseling. This can help make  quitting easier. What things can I do to make it easier to quit? Quitting smoking might feel very hard at first, but there is a lot that you can do to make it easier. Take these steps:  Talk to your family and friends. Ask them to support and encourage you.  Call phone quitlines, reach out to support groups, or work with a counselor.  Ask people who smoke to not smoke around you.  Avoid places that make you want (trigger) to smoke, such as: ? Bars. ? Parties. ? Smoke-break areas at work.  Spend time with people who do not smoke.  Lower the stress in your life. Stress can make you want to smoke. Try these things to help your stress: ? Getting regular exercise. ? Deep-breathing exercises. ? Yoga. ? Meditating. ? Doing a body scan. To do this, close your eyes, focus on one area of your body at a time from head to toe, and notice which parts of your body are tense. Try to relax the muscles in those areas.  Download or buy apps on your mobile phone or tablet that can help you stick to your quit plan. There are many free apps, such as QuitGuide from the CDC (Centers for Disease Control and Prevention). You can find more support from smokefree.gov and other websites.  This information is not intended to replace advice given to you by your health care provider. Make sure you discuss any questions you have with your health care provider. Document Released: 11/11/2008 Document   Revised: 09/13/2015 Document Reviewed: 06/01/2014 Elsevier Interactive Patient Education  2018 [place] Eating Plan DASH stands for "Dietary Approaches to Stop Hypertension." The DASH eating plan is a healthy eating plan that has been shown to reduce high blood pressure (hypertension). It may also reduce your risk for type 2 diabetes, heart disease, and stroke. The DASH eating plan may also help with weight loss. What are tips for following this plan? General guidelines  Avoid eating more than 2,300 mg  (milligrams) of salt (sodium) a day. If you have hypertension, you may need to reduce your sodium intake to 1,500 mg a day.  Limit alcohol intake to no more than 1 drink a day for nonpregnant women and 2 drinks a day for men. One drink equals 12 oz of beer, 5 oz of wine, or 1 oz of hard liquor.  Work with your health care provider to maintain a healthy body weight or to lose weight. Ask what an ideal weight is for you.  Get at least 30 minutes of exercise that causes your heart to beat faster (aerobic exercise) most days of the week. Activities may include walking, swimming, or biking.  Work with your health care provider or diet and nutrition specialist (dietitian) to adjust your eating plan to your individual calorie needs. Reading food labels  Check food labels for the amount of sodium per serving. Choose foods with less than 5 percent of the Daily Value of sodium. Generally, foods with less than 300 mg of sodium per serving fit into this eating plan.  To find whole grains, look for the word "whole" as the first word in the ingredient list. Shopping  Buy products labeled as "low-sodium" or "no salt added."  Buy fresh foods. Avoid canned foods and premade or frozen meals. Cooking  Avoid adding salt when cooking. Use salt-free seasonings or herbs instead of table salt or sea salt. Check with your health care provider or pharmacist before using salt substitutes.  Do not fry foods. Cook foods using healthy methods such as baking, boiling, grilling, and broiling instead.  Cook with heart-healthy oils, such as olive, canola, soybean, or sunflower oil. Meal planning   Eat a balanced diet that includes: ? 5 or more servings of fruits and vegetables each day. At each meal, try to fill half of your plate with fruits and vegetables. ? Up to 6-8 servings of whole grains each day. ? Less than 6 oz of lean meat, poultry, or fish each day. A 3-oz serving of meat is about the same size as a deck  of cards. One egg equals 1 oz. ? 2 servings of low-fat dairy each day. ? A serving of nuts, seeds, or beans 5 times each week. ? Heart-healthy fats. Healthy fats called Omega-3 fatty acids are found in foods such as flaxseeds and coldwater fish, like sardines, salmon, and mackerel.  Limit how much you eat of the following: ? Canned or prepackaged foods. ? Food that is high in trans fat, such as fried foods. ? Food that is high in saturated fat, such as fatty meat. ? Sweets, desserts, sugary drinks, and other foods with added sugar. ? Full-fat dairy products.  Do not salt foods before eating.  Try to eat at least 2 vegetarian meals each week.  Eat more home-cooked food and less restaurant, buffet, and fast food.  When eating at a restaurant, ask that your food be prepared with less salt or no salt, if possible. What foods are recommended? The  items listed may not be a complete list. Talk with your dietitian about what dietary choices are best for you. Grains Whole-grain or whole-wheat bread. Whole-grain or whole-wheat pasta. Brown rice. Oatmeal. Quinoa. Bulgur. Whole-grain and low-sodium cereals. Pita bread. Low-fat, low-sodium crackers. Whole-wheat flour tortillas. Vegetables Fresh or frozen vegetables (raw, steamed, roasted, or grilled). Low-sodium or reduced-sodium tomato and vegetable juice. Low-sodium or reduced-sodium tomato sauce and tomato paste. Low-sodium or reduced-sodium canned vegetables. Fruits All fresh, dried, or frozen fruit. Canned fruit in natural juice (without added sugar). Meat and other protein foods Skinless chicken or turkey. Ground chicken or turkey. Pork with fat trimmed off. Fish and seafood. Egg whites. Dried beans, peas, or lentils. Unsalted nuts, nut butters, and seeds. Unsalted canned beans. Lean cuts of beef with fat trimmed off. Low-sodium, lean deli meat. Dairy Low-fat (1%) or fat-free (skim) milk. Fat-free, low-fat, or reduced-fat cheeses. Nonfat,  low-sodium ricotta or cottage cheese. Low-fat or nonfat yogurt. Low-fat, low-sodium cheese. Fats and oils Soft margarine without trans fats. Vegetable oil. Low-fat, reduced-fat, or light mayonnaise and salad dressings (reduced-sodium). Canola, safflower, olive, soybean, and sunflower oils. Avocado. Seasoning and other foods Herbs. Spices. Seasoning mixes without salt. Unsalted popcorn and pretzels. Fat-free sweets. What foods are not recommended? The items listed may not be a complete list. Talk with your dietitian about what dietary choices are best for you. Grains Baked goods made with fat, such as croissants, muffins, or some breads. Dry pasta or rice meal packs. Vegetables Creamed or fried vegetables. Vegetables in a cheese sauce. Regular canned vegetables (not low-sodium or reduced-sodium). Regular canned tomato sauce and paste (not low-sodium or reduced-sodium). Regular tomato and vegetable juice (not low-sodium or reduced-sodium). Pickles. Olives. Fruits Canned fruit in a light or heavy syrup. Fried fruit. Fruit in cream or butter sauce. Meat and other protein foods Fatty cuts of meat. Ribs. Fried meat. Bacon. Sausage. Bologna and other processed lunch meats. Salami. Fatback. Hotdogs. Bratwurst. Salted nuts and seeds. Canned beans with added salt. Canned or smoked fish. Whole eggs or egg yolks. Chicken or turkey with skin. Dairy Whole or 2% milk, cream, and half-and-half. Whole or full-fat cream cheese. Whole-fat or sweetened yogurt. Full-fat cheese. Nondairy creamers. Whipped toppings. Processed cheese and cheese spreads. Fats and oils Butter. Stick margarine. Lard. Shortening. Ghee. Bacon fat. Tropical oils, such as coconut, palm kernel, or palm oil. Seasoning and other foods Salted popcorn and pretzels. Onion salt, garlic salt, seasoned salt, table salt, and sea salt. Worcestershire sauce. Tartar sauce. Barbecue sauce. Teriyaki sauce. Soy sauce, including reduced-sodium. Steak sauce.  Canned and packaged gravies. Fish sauce. Oyster sauce. Cocktail sauce. Horseradish that you find on the shelf. Ketchup. Mustard. Meat flavorings and tenderizers. Bouillon cubes. Hot sauce and Tabasco sauce. Premade or packaged marinades. Premade or packaged taco seasonings. Relishes. Regular salad dressings. Where to find more information:  National Heart, Lung, and Blood Institute: www.nhlbi.nih.gov  American Heart Association: www.heart.org Summary  The DASH eating plan is a healthy eating plan that has been shown to reduce high blood pressure (hypertension). It may also reduce your risk for type 2 diabetes, heart disease, and stroke.  With the DASH eating plan, you should limit salt (sodium) intake to 2,300 mg a day. If you have hypertension, you may need to reduce your sodium intake to 1,500 mg a day.  When on the DASH eating plan, aim to eat more fresh fruits and vegetables, whole grains, lean proteins, low-fat dairy, and heart-healthy fats.  Work with your health care provider   or diet and nutrition specialist (dietitian) to adjust your eating plan to your individual calorie needs. This information is not intended to replace advice given to you by your health care provider. Make sure you discuss any questions you have with your health care provider. Document Released: 01/04/2011 Document Revised: 01/09/2016 Document Reviewed: 01/09/2016 Elsevier Interactive Patient Education  2017 [employer] Hypertension Hypertension, commonly called high blood pressure, is when the force of blood pumping through the arteries is too strong. The arteries are the blood vessels that carry blood from the heart throughout the body. Hypertension forces the heart to work harder to pump blood and may cause arteries to become narrow or stiff. Having untreated or uncontrolled hypertension can cause heart attacks, strokes, kidney disease, and other problems. A blood pressure reading consists of a higher number over  a lower number. Ideally, your blood pressure should be below 120/80. The first ("top") number is called the systolic pressure. It is a measure of the pressure in your arteries as your heart beats. The second ("bottom") number is called the diastolic pressure. It is a measure of the pressure in your arteries as the heart relaxes. What are the causes? The cause of this condition is not known. What increases the risk? Some risk factors for high blood pressure are under your control. Others are not. Factors you can change  Smoking.  Having type 2 diabetes mellitus, high cholesterol, or both.  Not getting enough exercise or physical activity.  Being overweight.  Having too much fat, sugar, calories, or salt (sodium) in your diet.  Drinking too much alcohol. Factors that are difficult or impossible to change  Having chronic kidney disease.  Having a family history of high blood pressure.  Age. Risk increases with age.  Race. You may be at higher risk if you are African-American.  Gender. Men are at higher risk than women before age 45. After age 65, women are at higher risk than men.  Having obstructive sleep apnea.  Stress. What are the signs or symptoms? Extremely high blood pressure (hypertensive crisis) may cause:  Headache.  Anxiety.  Shortness of breath.  Nosebleed.  Nausea and vomiting.  Severe chest pain.  Jerky movements you cannot control (seizures).  How is this diagnosed? This condition is diagnosed by measuring your blood pressure while you are seated, with your arm resting on a surface. The cuff of the blood pressure monitor will be placed directly against the skin of your upper arm at the level of your heart. It should be measured at least twice using the same arm. Certain conditions can cause a difference in blood pressure between your right and left arms. Certain factors can cause blood pressure readings to be lower or higher than normal (elevated) for a  short period of time:  When your blood pressure is higher when you are in a health care provider's office than when you are at home, this is called white coat hypertension. Most people with this condition do not need medicines.  When your blood pressure is higher at home than when you are in a health care provider's office, this is called masked hypertension. Most people with this condition may need medicines to control blood pressure.  If you have a high blood pressure reading during one visit or you have normal blood pressure with other risk factors:  You may be asked to return on a different day to have your blood pressure checked again.  You may be asked to monitor your   blood pressure at home for 1 week or longer.  If you are diagnosed with hypertension, you may have other blood or imaging tests to help your health care provider understand your overall risk for other conditions. How is this treated? This condition is treated by making healthy lifestyle changes, such as eating healthy foods, exercising more, and reducing your alcohol intake. Your health care provider may prescribe medicine if lifestyle changes are not enough to get your blood pressure under control, and if:  Your systolic blood pressure is above 130.  Your diastolic blood pressure is above 80.  Your personal target blood pressure may vary depending on your medical conditions, your age, and other factors. Follow these instructions at home: Eating and drinking  Eat a diet that is high in fiber and potassium, and low in sodium, added sugar, and fat. An example eating plan is called the DASH (Dietary Approaches to Stop Hypertension) diet. To eat this way: ? Eat plenty of fresh fruits and vegetables. Try to fill half of your plate at each meal with fruits and vegetables. ? Eat whole grains, such as whole wheat pasta, brown rice, or whole grain bread. Fill about one quarter of your plate with whole grains. ? Eat or drink  low-fat dairy products, such as skim milk or low-fat yogurt. ? Avoid fatty cuts of meat, processed or cured meats, and poultry with skin. Fill about one quarter of your plate with lean proteins, such as fish, chicken without skin, beans, eggs, and tofu. ? Avoid premade and processed foods. These tend to be higher in sodium, added sugar, and fat.  Reduce your daily sodium intake. Most people with hypertension should eat less than 1,500 mg of sodium a day.  Limit alcohol intake to no more than 1 drink a day for nonpregnant women and 2 drinks a day for men. One drink equals 12 oz of beer, 5 oz of wine, or 1 oz of hard liquor. Lifestyle  Work with your health care provider to maintain a healthy body weight or to lose weight. Ask what an ideal weight is for you.  Get at least 30 minutes of exercise that causes your heart to beat faster (aerobic exercise) most days of the week. Activities may include walking, swimming, or biking.  Include exercise to strengthen your muscles (resistance exercise), such as pilates or lifting weights, as part of your weekly exercise routine. Try to do these types of exercises for 30 minutes at least 3 days a week.  Do not use any products that contain nicotine or tobacco, such as cigarettes and e-cigarettes. If you need help quitting, ask your health care provider.  Monitor your blood pressure at home as told by your health care provider.  Keep all follow-up visits as told by your health care provider. This is important. Medicines  Take over-the-counter and prescription medicines only as told by your health care provider. Follow directions carefully. Blood pressure medicines must be taken as prescribed.  Do not skip doses of blood pressure medicine. Doing this puts you at risk for problems and can make the medicine less effective.  Ask your health care provider about side effects or reactions to medicines that you should watch for. Contact a health care provider  if:  You think you are having a reaction to a medicine you are taking.  You have headaches that keep coming back (recurring).  You feel dizzy.  You have swelling in your ankles.  You have trouble with your vision. Get   help right away if:  You develop a severe headache or confusion.  You have unusual weakness or numbness.  You feel faint.  You have severe pain in your chest or abdomen.  You vomit repeatedly.  You have trouble breathing. Summary  Hypertension is when the force of blood pumping through your arteries is too strong. If this condition is not controlled, it may put you at risk for serious complications.  Your personal target blood pressure may vary depending on your medical conditions, your age, and other factors. For most people, a normal blood pressure is less than 120/80.  Hypertension is treated with lifestyle changes, medicines, or a combination of both. Lifestyle changes include weight loss, eating a healthy, low-sodium diet, exercising more, and limiting alcohol. This information is not intended to replace advice given to you by your health care provider. Make sure you discuss any questions you have with your health care provider. Document Released: 01/15/2005 Document Revised: 12/14/2015 Document Reviewed: 12/14/2015 Elsevier Interactive Patient Education  2018 [employer]  Inguinal Hernia, Adult An inguinal hernia is when fat or the intestines push through the area where the leg meets the lower belly (groin) and make a rounded lump (bulge). This condition happens over time. There are three types of inguinal hernias. These types include:  Hernias that can be pushed back into the belly (are reducible).  Hernias that cannot be pushed back into the belly (are incarcerated).  Hernias that cannot be pushed back into the belly and lose their blood supply (get strangulated). This type needs emergency surgery.  Follow these instructions at  home: Lifestyle  Drink enough fluid to keep your urine (pee) clear or pale yellow.  Eat plenty of fruits, vegetables, and whole grains. These have a lot of fiber. Talk with your doctor if you have questions.  Avoid lifting heavy objects.  Avoid standing for long periods of time.  Do not use tobacco products. These include cigarettes, chewing tobacco, or e-cigarettes. If you need help quitting, ask your doctor.  Try to stay at a healthy weight. General instructions  Do not try to force the hernia back in.  Watch your hernia for any changes in color or size. Let your doctor know if there are any changes.  Take over-the-counter and prescription medicines only as told by your doctor.  Keep all follow-up visits as told by your doctor. This is important. Contact a doctor if:  You have a fever.  You have new symptoms.  Your symptoms get worse. Get help right away if:  The area where the legs meets the lower belly has: ? Pain that gets worse suddenly. ? A bulge that gets bigger suddenly and does not go down. ? A bulge that turns red or purple. ? A bulge that is painful to the touch.  You are a man and your scrotum: ? Suddenly feels painful. ? Suddenly changes in size.  You feel sick to your stomach (nauseous) and this feeling does not go away.  You throw up (vomit) and this keeps happening.  You feel your heart beating a lot more quickly than normal.  You cannot poop (have a bowel movement) or pass gas. This information is not intended to replace advice given to you by your health care provider. Make sure you discuss any questions you have with your health care provider. Document Released: 02/15/2006 Document Revised: 06/23/2015 Document Reviewed: 11/25/2013 Elsevier Interactive Patient Education  2018 [employer].

## 2016-10-15 NOTE — Progress Notes (Signed)
Jared Reyes, is a 61 y.o. male  VVO:[id number]  YIR:[id number]  DOB - [birth year]  Chief Complaint  Patient presents with  . Hypertension      Subjective:   Jared Reyes is a 61 y.o. 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 Patient brings a form from [facility] yielded to be completed. He does not drive regularly. He used to be a heavy alcohol user but has since reduced to only 1 drinks in like every 2 weeks. He denies any recent seizure activity, last known seizure was in 2016. He has multiple dental problems needing dentists hypertension. He has no new or major complaint today. He has chronic right inguinoscrotal hernia but patient has not been able to seek surgical opinion or intervention due to lack of insurance. Patient denies any suicidal ideation or thoughts. Patient has No headache, No chest pain, No abdominal pain - No Nausea, No new weakness tingling or numbness, No Cough - SOB.  Problem  Cigarette Smoker    ALLERGIES: Allergies  Allergen Reactions  . Nsaids     Intolerance, hx of gastritis and gastric ulcer    PAST MEDICAL HISTORY: Past Medical History:  Diagnosis Date  . Anxiety   . Cellulitis 09/20/2015   right elbow  . Esophagitis   . Hypertension   . Seizures ([place]) 01-08-07   alcohol withdrawal   . Substance abuse    alcoholism    MEDICATIONS AT HOME: Prior to Admission medications   Medication Sig Start Date End Date Taking? Authorizing Provider  acetaminophen-codeine (TYLENOL #3) 300-30 MG tablet Take 1 tablet by mouth every 8 (eight) hours as needed for moderate pain. 07/12/16  Yes Funches, Josalyn, MD  amLODipine (NORVASC) 10 MG tablet Take 1 tablet (10 mg total) by mouth daily. 05/08/16  Yes Funches, Josalyn, MD  atorvastatin (LIPITOR) 20 MG tablet Take 2 tablets (40 mg total) by mouth daily. 07/19/16  Yes  Funches, Josalyn, MD  docusate sodium (COLACE) 100 MG capsule Take 1 capsule (100 mg total) by mouth daily. 02/07/16  Yes Funches, Josalyn, MD  Glucosamine-Chondroitin 750-600 MG TABS Take 2 tablets by mouth daily. 01/12/16  Yes Funches, Adriana Mccallum, MD  levETIRAcetam (KEPPRA XR) 500 MG 24 hr tablet Take 2 tablets (1,000 mg total) by mouth daily. 07/12/16  Yes Funches, Adriana Mccallum, MD  traZODone (DESYREL) 50 MG tablet Take 1.5 tablets (75 mg total) by mouth at bedtime. 07/12/16  Yes Funches, Josalyn, MD  Turmeric 500 MG CAPS Take 500 mg by mouth daily. 01/12/16  Yes Funches, Josalyn, MD  albuterol (VENTOLIN HFA) 108 (90 Base) MCG/ACT inhaler Inhale 2 puffs into the lungs every 6 (six) hours as needed for wheezing or shortness of breath. 08/08/16   Boykin Nearing, MD    Objective:   Vitals:   10/15/16 1427  BP: (!) 143/91  Pulse: 93  Temp: 98.9 F (37.2 C)  TempSrc: Oral   Exam General appearance : Awake, alert, not in any distress. Speech Clear. Not toxic looking HEENT: Atraumatic and Normocephalic, pupils equally reactive to light and accomodation Neck: Supple, no JVD. No cervical lymphadenopathy.  Chest: Good air entry bilaterally, no added sounds  CVS: S1 S2 regular, no murmurs.  Abdomen: Right Inguinoscrotal Hernia, reducible. Bowel sounds present, Non tender and not distended with no gaurding, rigidity or rebound. Extremities: B/L Lower Ext shows no edema, both legs are warm to  touch Neurology: Awake alert, and oriented X 3, CN II-XII intact, Non focal Skin: No Rash  Data Review Lab Results  Component Value Date   HGBA1C 4 8 03/11/2015    Assessment & Plan   1. Essential hypertension  - CBC with Differential/Platelet - CMP14+EGFR - Lipid panel - TSH  We have discussed target BP range and blood pressure goal. I have advised patient to check BP regularly and to call us back or report to clinic if the numbers are consistently higher than 140/90. We discussed the importance of  compliance with medical therapy and DASH diet recommended, consequences of uncontrolled hypertension discussed.  - continue current BP medications  2. Chronic low back pain, unspecified back pain laterality, with sciatica presence unspecified  - Continue Tylenol OTC prn, do not use more than 2400 mg per day - Avoid excessive alcohol use  3. Cigarette smoker  Justo was counseled on the dangers of tobacco use, and was advised to quit. Reviewed strategies to maximize success, including removing cigarettes and smoking materials from environment, stress management and support of family/friends.  4. Unilateral recurrent inguinal hernia without obstruction or gangrene  - Ambulatory referral to General Surgery  Patient have been counseled extensively about nutrition and exercise. Other issues discussed during this visit include: low cholesterol diet, weight control and daily exercise, importance of adherence with medications and regular follow-up. We also discussed long term complications of uncontrolled hypertension.   Return in about 3 months (around 01/14/2017) for Routine Follow Up, Follow up HTN, Follow up Pain and comorbidities.  The patient was given clear instructions to go to ER or return to medical center if symptoms don't improve, worsen or new problems develop. The patient verbalized understanding. The patient was told to call to get lab results if they haven't heard anything in the next week.   This note has been created with Surveyor, quantity. Any transcriptional errors are unintentional.    Angelica Chessman, MD, [place], Karilyn Cota, [place] [facility] [place], [place]   10/15/2016, 2:56 PM

## 2016-10-16 LAB — CBC WITH DIFFERENTIAL/PLATELET
Basophils Absolute: 0 10*3/uL (ref 0.0–0.2)
Basos: 1 %
EOS (ABSOLUTE): 0.1 10*3/uL (ref 0.0–0.4)
EOS: 1 %
HEMATOCRIT: 47.2 % (ref 37.5–51.0)
Hemoglobin: 16.2 g/dL (ref 13.0–17.7)
Immature Grans (Abs): 0.1 10*3/uL (ref 0.0–0.1)
Immature Granulocytes: 1 %
LYMPHS ABS: 1.6 10*3/uL (ref 0.7–3.1)
Lymphs: 21 %
MCH: 32.9 pg (ref 26.6–33.0)
MCHC: 34.3 g/dL (ref 31.5–35.7)
MCV: 96 fL (ref 79–97)
MONOS ABS: 0.6 10*3/uL (ref 0.1–0.9)
Monocytes: 8 %
Neutrophils Absolute: 5.2 10*3/uL (ref 1.4–7.0)
Neutrophils: 68 %
Platelets: 185 10*3/uL (ref 150–379)
RBC: 4.93 x10E6/uL (ref 4.14–5.80)
RDW: 14.2 % (ref 12.3–15.4)
WBC: 7.5 10*3/uL (ref 3.4–10.8)

## 2016-10-16 LAB — CMP14+EGFR
A/G RATIO: 2.2 (ref 1.2–2.2)
ALK PHOS: 56 IU/L (ref 39–117)
ALT: 123 IU/L — ABNORMAL HIGH (ref 0–44)
AST: 160 IU/L — AB (ref 0–40)
Albumin: 4.9 g/dL — ABNORMAL HIGH (ref 3.6–4.8)
BILIRUBIN TOTAL: 0.8 mg/dL (ref 0.0–1.2)
BUN/Creatinine Ratio: 15 (ref 10–24)
BUN: 14 mg/dL (ref 8–27)
CO2: 23 mmol/L (ref 20–29)
Calcium: 9.8 mg/dL (ref 8.6–10.2)
Chloride: 100 mmol/L (ref 96–106)
Creatinine, Ser: 0.93 mg/dL (ref 0.76–1.27)
GFR calc Af Amer: 102 mL/min/{1.73_m2} (ref >59)
GFR, EST NON AFRICAN AMERICAN: 88 mL/min/{1.73_m2} (ref >59)
GLOBULIN, TOTAL: 2.2 g/dL (ref 1.5–4.5)
Glucose: 89 mg/dL (ref 65–99)
POTASSIUM: 4.2 mmol/L (ref 3.5–5.2)
SODIUM: 140 mmol/L (ref 134–144)
Total Protein: 7.1 g/dL (ref 6.0–8.5)

## 2016-10-16 LAB — LIPID PANEL
CHOL/HDL RATIO: 1.5 ratio (ref 0.0–5.0)
CHOLESTEROL TOTAL: 126 mg/dL (ref 100–199)
HDL: 86 mg/dL (ref >39)
LDL Calculated: 27 mg/dL (ref 0–99)
TRIGLYCERIDES: 64 mg/dL (ref 0–149)
VLDL Cholesterol Cal: 13 mg/dL (ref 5–40)

## 2016-10-16 LAB — TSH: TSH: 0.842 u[IU]/mL (ref 0.450–4.500)

## 2016-10-22 ENCOUNTER — Telehealth: Payer: Self-pay | Admitting: Internal Medicine

## 2016-10-22 MED FILL — AMLODIPINE BESYLATE 10 MG T: 10 | 30 days supply | Qty: 30 | Fill #4

## 2016-10-22 MED FILL — ?ATORVASTATIN 20 MG TABLET: 20 | 30 days supply | Qty: 60 | Fill #2

## 2016-10-22 MED FILL — LEVETIRACETAM ER 500 MG TAB: 500 | 30 days supply | Qty: 60 | Fill #3

## 2016-10-22 NOTE — Telephone Encounter (Signed)
-----   Message from Quentin Angst, MD sent at 10/18/2016  4:41 PM EDT ----- Please inform patient that his lab results are mostly normal except elevated liver enzymes, please avoid alcohol and over the counter tylenol or tylenol containing medications, we will monitor the liver enzyme with a recheck during next visit. Potassium is back to normal

## 2016-10-22 NOTE — Telephone Encounter (Signed)
Patient called states form that was filled out by PCP was not filled out correctly and now patient has lost his license. Please f/up

## 2016-10-22 NOTE — Telephone Encounter (Signed)
Patient verified DOB Patient is aware of lab results showing liver enzymes being elevated and to avoid motrin, ibuprofen. Patient is also aware of potassium level being back to normal. Patient is also advised to bring the letter and will have it completed. MA spoke with the pharmacy and the patient is aware of his medications being ready on Wednesday for pickup. No further questions at this time.

## 2016-10-25 ENCOUNTER — Telehealth: Payer: Self-pay | Admitting: Internal Medicine

## 2016-10-25 MED FILL — traZODone HCL 50 MG TABS: 50 | 30 days supply | Qty: 45 | Fill #1

## 2016-10-25 MED FILL — ACETAMINOPHEN/COD #3 TABLET: 300-30 | 20 days supply | Qty: 60 | Fill #2

## 2016-10-25 NOTE — Telephone Encounter (Signed)
Pt. Came to facility to drop of Department of Transportation paperwork to be filled out. Pt. States that he received a letter again stating that it was not filled out completely. Paperwork will be put in PCP box and he will be called once it's ready for pick up.

## 2016-10-29 ENCOUNTER — Ambulatory Visit: Payer: Self-pay

## 2016-10-29 NOTE — Telephone Encounter (Signed)
Patient may pick it up from the front. It has already been faxed

## 2016-10-29 NOTE — Telephone Encounter (Signed)
Pt came in for financial appointment and is requesting status of paperwork, pt is requesting a copy of it. Please f/up

## 2016-11-08 ENCOUNTER — Telehealth: Payer: Self-pay

## 2016-11-08 ENCOUNTER — Ambulatory Visit (INDEPENDENT_AMBULATORY_CARE_PROVIDER_SITE_OTHER): Payer: No Typology Code available for payment source | Admitting: Surgery

## 2016-11-08 ENCOUNTER — Encounter: Payer: Self-pay | Admitting: Surgery

## 2016-11-08 VITALS — BP 151/99 | HR 98 | Temp 98.1°F | Ht 68.0 in | Wt 163.8 lb

## 2016-11-08 DIAGNOSIS — K4091 Unilateral inguinal hernia, without obstruction or gangrene, recurrent: Secondary | ICD-10-CM

## 2016-11-08 NOTE — Telephone Encounter (Signed)
Medical Clearance faxed to Dr.Enobong Amao at this time. Patient has an appointment on 11/09/16 @ 10:30 am.

## 2016-11-08 NOTE — Progress Notes (Signed)
Surgical Clinic History and Physical  Referring provider:  Quentin Angst, MD [address] [place], [place] [address]  HISTORY OF PRESENT ILLNESS (HPI):  61 y.o. male presents for evaluation of his chronic large Right inguinal hernia. Patient reports his hernia was repaired when he was a child, and it's recurred >15 years ago. He says it generally hasn't bothered him and he hasn't previously had it repaired due to lack of insurance (now reportedly covered 100% by [facility]), but it's recently started to become uncomfortable and painful with heavy lifting. Patient describes +flatus and denies any constipation, straining with urination, or coughing despite still smoking 1 pack per week. Otherwise, patient has a history of "heavy drinking" until 2 years ago, but now only drinks 1 - 2 alcoholic drinks per week and reports he can walk a block or up/down a flight of steps without CP or SOB, though he says he often uses his inhaler once or sometimes twice per day, but also says sometimes he does not use it at all.  PAST MEDICAL HISTORY (PMH):  Past Medical History:  Diagnosis Date  . Anxiety   . Cellulitis 09/20/2015   right elbow  . Chronic low back pain 10/22/2014  . Depression 01/14/2007   Qualifier: Diagnosis of  By: Clent Ridges MD, Tera Mater   . Esophagitis   . Essential hypertension 10/25/2006   Qualifier: Diagnosis of  By: Linna Darner, CMA, Cindy    . 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 History of gastric ulcer 12/31/2014  . History of stroke 03/11/2015  . Poor dentition    multiple broken teeth (only few not broken)  . Seizures ([facility]) 01/08/2007   alcohol withdrawal, reports last seizure 2016  . Substance abuse ([facility])    alcoholism     PAST SURGICAL HISTORY (PSH):  Past Surgical History:  Procedure Laterality Date  . ABDOMINAL SURGERY    . HAND SURGERY     left hand  . HERNIA REPAIR    . TONSILLECTOMY       MEDICATIONS:  Prior to Admission medications    Medication Sig Start Date End Date Taking? Authorizing Provider  acetaminophen-codeine (TYLENOL #3) 300-30 MG tablet Take 1 tablet by mouth every 8 (eight) hours as needed for moderate pain. 07/12/16  Yes Funches, Josalyn, MD  albuterol (VENTOLIN HFA) 108 (90 Base) MCG/ACT inhaler Inhale 2 puffs into the lungs every 6 (six) hours as needed for wheezing or shortness of breath. 08/08/16  Yes Funches, Josalyn, MD  amLODipine (NORVASC) 10 MG tablet Take 1 tablet (10 mg total) by mouth daily. 05/08/16  Yes Funches, Josalyn, MD  atorvastatin (LIPITOR) 20 MG tablet Take 2 tablets (40 mg total) by mouth daily. 07/19/16  Yes Funches, Josalyn, MD  docusate sodium (COLACE) 100 MG capsule Take 1 capsule (100 mg total) by mouth daily. 02/07/16  Yes Funches, Josalyn, MD  Glucosamine-Chondroitin 750-600 MG TABS Take 2 tablets by mouth daily. 01/12/16  Yes Funches, Gerilyn Nestle, MD  levETIRAcetam (KEPPRA XR) 500 MG 24 hr tablet Take 2 tablets (1,000 mg total) by mouth daily. 07/12/16  Yes Funches, Gerilyn Nestle, MD  traZODone (DESYREL) 50 MG tablet Take 1.5 tablets (75 mg total) by mouth at bedtime. 07/12/16  Yes Funches, Josalyn, MD  Turmeric 500 MG CAPS Take 500 mg by mouth daily. 01/12/16  Yes Dessa Phi, MD     ALLERGIES:  Allergies  Allergen Reactions  . Nsaids     Intolerance, hx of gastritis and gastric ulcer  SOCIAL HISTORY:  Social History   Social History  . Marital status: Divorced    Spouse name: N/A  . Number of children: N/A  . Years of education: N/A   Occupational History  . Not on file.   Social History Main Topics  . Smoking status: Current Every Day Smoker    Packs/day: 0.00    Types: Cigarettes  . Smokeless tobacco: Never Used  . Alcohol use 25.2 oz/week    42 Cans of beer per week     Comment: Last drank two weeks ago per patient.  . Drug use: No  . Sexual activity: Not on file   Other Topics Concern  . Not on file   Social History Narrative  . No narrative on file    The  patient currently resides (home / rehab facility / nursing home): Home The patient normally is (ambulatory / bedbound): Ambulatory  FAMILY HISTORY:  Family History  Problem Relation Age of Onset  . Hypertension Mother   . Diabetes Mother   . Hypertension Father   . Alcohol abuse Unknown        fhx  . Diabetes Unknown        fhx  . Hypertension Unknown        fhx  . Hypertension Sister   . Hypertension Brother     Otherwise negative/non-contributory.  REVIEW OF SYSTEMS:  Constitutional: denies any other weight loss, fever, chills, or sweats  Eyes: denies any other vision changes, history of eye injury  ENT: denies sore throat, hearing problems  Respiratory: denies shortness of breath, wheezing  Cardiovascular: denies chest pain, palpitations Gastrointestinal: large Right inguinal hernia, denies abdominal pain, N/V, constipation, or diarrhea Musculoskeletal: denies any other joint pains or cramps  Skin: Denies any other rashes or skin discolorations Neurological: denies any other headache, dizziness, weakness  Psychiatric: Denies any other depression, anxiety   All other review of systems were otherwise negative   VITAL SIGNS:  BP (!) 151/99   Pulse 98   Temp 98.1 F (36.7 C) (Oral)   Ht  (1.727 m)   Wt 163 lb 12.8 oz (74.3 kg)   BMI 24.91 kg/m   PHYSICAL EXAM:  Constitutional:  -- Normal body habitus  -- Awake, alert, and oriented x3  Eyes:  -- Pupils equally round and reactive to light  -- No scleral icterus  Mouth, Ear, nose, throat:  -- Very poor dentition (most teeth broken and discolored) -- No jugular venous distension -- No nasal drainage, bleeding Pulmonary:  -- No crackles, equal breath sounds bilaterally -- Smells like cigarette smoke, despite reporting only limited smoking -- Breathing non-labored at rest Cardiovascular:  -- S1, S2 present  -- No pericardial rubs  Gastrointestinal: -- Abdomen soft, nontender, non-distended, no  guarding/rebound  -- Very large, non-tender, and reducible Right inguinal hernia the size and shape of a football, though reports discomfort/pain in his mid-abdomen when most-all of his hernia contents (at least much of his small intestine) were reduced into his abdomen -- No other abdominal masses appreciated, pulsatile or otherwise  Musculoskeletal and Integumentary:  -- Wounds or skin discoloration: None appreciated except well-healed Right groin linear surgical scar -- Extremities: B/L UE and LE FROM, hands and feet warm, no edema  Neurologic:  -- Motor function: Intact and symmetric -- Sensation: Intact and symmetric  Labs:  CBC Latest Ref Rng & Units 10/15/2016 09/21/2015 09/20/2015  WBC 3.4 - 10.8 x10E3/uL 7.5 9.4 13.3(H)  Hemoglobin 13.0 - 17.7  g/dL 44.0 12.7(L) 13.7  Hematocrit 37.5 - 51.0 % 47.2 36.0(L) 37.8(L)  Platelets 150 - 379 x10E3/uL 185 259 233   CMP Latest Ref Rng & Units 10/15/2016 10/04/2015 09/21/2015  Glucose 65 - 99 mg/dL 89 86 347(Q)  BUN 8 - 27 mg/dL Creatinine 0.76 - 1.27 mg/dL 2.59 5.63 8.75  Sodium 134 - 144 mmol/L 140 135 129(L)  Potassium 3.5 - 5.2 mmol/L 4.2 4.9 2.5(LL)  Chloride 96 - 106 mmol/L 100 100 93(L)  CO2 20 - 29 mmol/L Calcium 8.6 - 10.2 mg/dL 9.8 9.3 6.4(P)  Total Protein 6.0 - 8.5 g/dL 7.1 - 5.7(L)  Total Bilirubin 0.0 - 1.2 mg/dL 0.8 - 1.1  Alkaline Phos 39 - 117 IU/L 56 - 58  AST 0 - 40 IU/L 160(H) - 38  ALT 0 - 44 IU/L 123(H) - 37     Imaging studies:  CT Abdomen and Pelvis with Oral Contrast Only (11/10/2008) - personally reviewed Minimal atelectasis at lung bases. Large amounts of free air and free GI contrast throughout abdominal cavity compatible with perforated viscus. Significant wall thickening identified at distal stomach, at antrum and pyloric region, question site of perforation. Bowel loops appear decompressed without associated wall thickening. Tiny umbilical hernia containing fat. No gross focal abnormalities  of liver, spleen, pancreas, kidneys, or adrenal glands. Small hiatal hernia. No upper abdominal mass. Single minimally enlarged peripancreatic lymph node 17 x 11 mm image 34.   Extensive free fluid and contrast as noted above. Large right inguinal hernia containing nondilated loops of the large and small bowel as well as free fluid. Bladder unremarkable. Sigmoid diverticulosis without wall thickening. Mildly enlarged prostate gland. No pelvic mass or adenopathy.  Assessment/Plan:  61 y.o. male with a very large and symptomatic reducible recurrent Right inguinal hernia without evidence of obstruction, complicated by co-morbidities including HTN; history of stroke (date unclear, described by PMD as "remote"); heavy alcohol abuse until 2 years ago with esophagitis, gastritis, and perforated gastric ulcer; chronic ongoing tobacco abuse; very poor dentition (most teeth are broken/missing and remaining teeth are discolored); history of seizures (attributed to withdrawal and most recently 2016); generalized anxiety disordere, and major depression disorder.   - no heavy lifting, maintain hydration and high fiber diet to minimize constipation   - all risks, benefits, and alternatives to repair of Right inguinal hernia with mesh (including, but not limited to: bleeding, infection, bowel injury, nerve injury, sexual dysfunction, impaired fertility, recurrence, and difficulties due to general anesthesia with intubation) were discussed with the patient, all of his questions were answered to his expressed satisfaction, patient expresses he wishes to proceed, and informed consent was accordingly obtained.   - smoking cessation strongly encouraged for general health and to reduce surgical wound and pulmonary complications  - will plan for elective repair of very large, chronic, symptomatic, recurrent Right inguinal hernia, tentatively 11/14  - patient understands Dr. Excell Seltzer will assist with procedure due to very large  size of recurrent inguinal hernia  - appreciate medical risk stratification and optimization prior to surgery, considering comorbidities  - instructed to call if any questions or concerns  All of the above recommendations were discussed with the patient, and all of patient's questions were answered to his expressed satisfaction.  Thank you for the opportunity to participate in this patient's care.  -- Scherrie Gerlach Earlene Plater, MD, RPVI [place]: [facility] General Surgery - Partnering for exceptional care. Office: [phone]

## 2016-11-08 NOTE — Patient Instructions (Signed)
We  Will schedule your surgery on 12/12/16 at [facility] with [facility]. Please see your blue pre-care sheet for surgery information.  Please call our office if you have any questions or concerns.

## 2016-11-09 ENCOUNTER — Encounter: Payer: Self-pay | Admitting: Family Medicine

## 2016-11-09 ENCOUNTER — Encounter: Payer: Self-pay | Admitting: Surgery

## 2016-11-09 ENCOUNTER — Ambulatory Visit: Payer: Self-pay | Attending: Internal Medicine | Admitting: Family Medicine

## 2016-11-09 ENCOUNTER — Other Ambulatory Visit: Payer: Self-pay

## 2016-11-09 VITALS — BP 133/84 | HR 94 | Temp 97.5°F | Ht 68.0 in | Wt 162.0 lb

## 2016-11-09 DIAGNOSIS — Z9889 Other specified postprocedural states: Secondary | ICD-10-CM | POA: Insufficient documentation

## 2016-11-09 DIAGNOSIS — K4090 Unilateral inguinal hernia, without obstruction or gangrene, not specified as recurrent: Secondary | ICD-10-CM | POA: Insufficient documentation

## 2016-11-09 DIAGNOSIS — K4091 Unilateral inguinal hernia, without obstruction or gangrene, recurrent: Secondary | ICD-10-CM

## 2016-11-09 DIAGNOSIS — I10 Essential (primary) hypertension: Secondary | ICD-10-CM

## 2016-11-09 DIAGNOSIS — Z79899 Other long term (current) drug therapy: Secondary | ICD-10-CM | POA: Insufficient documentation

## 2016-11-09 DIAGNOSIS — F419 Anxiety disorder, unspecified: Secondary | ICD-10-CM | POA: Insufficient documentation

## 2016-11-09 DIAGNOSIS — Z01818 Encounter for other preprocedural examination: Secondary | ICD-10-CM

## 2016-11-09 NOTE — Addendum Note (Signed)
Addended by: Satira Mccallum E on: 11/09/2016 11:11 PM   Modules accepted: Orders, SmartSet

## 2016-11-09 NOTE — Progress Notes (Signed)
Subjective:  Patient ID: Jared Reyes, male    DOB: 02/21/1955  Age: 61 y.o. MRN: [id number]  CC: Hypertension   HPI Jared Reyes is a 61 year old male with a history of hypertension, tobacco abuse, right inguinal scrotal hernia who presents today for preoperative clearance for surgery.  He currently sees Dr. Satira Mccallum of [facility] and is scheduled for hernia repair on 12/12/16. He endorses some pain at the site of his hernia which he attributes to being examined by his surgeon yesterday in an attempt to reduce his hernia.  He denies abdominal pain, nausea, vomiting and is able to move his bowels and pas gas. Denies chest pains or shortness of breath.  Past Medical History:  Diagnosis Date  . Anxiety   . Cellulitis 09/20/2015   right elbow  . Esophagitis   . Hypertension   . Seizures ([facility]) 01-08-07   alcohol withdrawal   . Substance abuse ([facility])    alcoholism    Past Surgical History:  Procedure Laterality Date  . ABDOMINAL SURGERY    . HAND SURGERY     left hand  . HERNIA REPAIR    . TONSILLECTOMY      Allergies  Allergen Reactions  . Nsaids     Intolerance, hx of gastritis and gastric ulcer     Outpatient Medications Prior to Visit  Medication Sig Dispense Refill  . acetaminophen-codeine (TYLENOL #3) 300-30 MG tablet Take 1 tablet by mouth every 8 (eight) hours as needed for moderate pain. 60 tablet 2  . albuterol (VENTOLIN HFA) 108 (90 Base) MCG/ACT inhaler Inhale 2 puffs into the lungs every 6 (six) hours as needed for wheezing or shortness of breath. 54 g 3  . amLODipine (NORVASC) 10 MG tablet Take 1 tablet (10 mg total) by mouth daily. 30 tablet 5  . atorvastatin (LIPITOR) 20 MG tablet Take 2 tablets (40 mg total) by mouth daily. 60 tablet 3  . docusate sodium (COLACE) 100 MG capsule Take 1 capsule (100 mg total) by mouth daily. 30 capsule 5  . levETIRAcetam (KEPPRA XR) 500 MG 24 hr tablet Take 2 tablets (1,000 mg total) by mouth  daily. 60 tablet 5  . traZODone (DESYREL) 50 MG tablet Take 1.5 tablets (75 mg total) by mouth at bedtime. 45 tablet 5  . Glucosamine-Chondroitin 750-600 MG TABS Take 2 tablets by mouth daily. (Patient not taking: Reported on 11/09/2016)  0  . Turmeric 500 MG CAPS Take 500 mg by mouth daily. (Patient not taking: Reported on 11/09/2016)     No facility-administered medications prior to visit.     ROS Review of Systems  Constitutional: Negative for activity change and appetite change.  HENT: Negative for sinus pressure and sore throat.   Eyes: Negative for visual disturbance.  Respiratory: Negative for cough, chest tightness and shortness of breath.   Cardiovascular: Negative for chest pain and leg swelling.  Gastrointestinal: Negative for abdominal distention, abdominal pain, constipation and diarrhea.  Endocrine: Negative.   Genitourinary:       See hpi   Musculoskeletal: Negative for joint swelling and myalgias.  Skin: Negative for rash.  Allergic/Immunologic: Negative.   Neurological: Negative for weakness, light-headedness and numbness.  Psychiatric/Behavioral: Negative for dysphoric mood and suicidal ideas.    Objective:  BP 133/84   Pulse 94   Temp (!) 97.5 F (36.4 C) (Oral)   Ht  (1.727 m)   Wt 162 lb (73.5 kg)   SpO2 100%   BMI  24.63 kg/m   BP/Weight 11/09/2016 11/08/2016 10/15/2016  Systolic BP 133 151 143  Diastolic BP 84 99 91  Wt. (Lbs) 162 163.8 -  BMI 24.63 24.91 -  Some encounter information is confidential and restricted. Go to Review Flowsheets activity to see all data.     Physical Exam  Constitutional: He is oriented to person, place, and time. He appears well-developed and well-nourished.  Cardiovascular: Normal rate, normal heart sounds and intact distal pulses.   No murmur heard. Pulmonary/Chest: Effort normal and breath sounds normal. He has no wheezes. He has no rales. He exhibits no tenderness.  Abdominal: Soft. Bowel sounds are normal.  He exhibits no distension and no mass. There is no tenderness.  Genitourinary:  Genitourinary Comments: Large right inguinal scrotal hernia  Musculoskeletal: Normal range of motion.  Neurological: He is alert and oriented to person, place, and time.  Skin: Skin is warm and dry.     Assessment & Plan:   1. Essential hypertension Controlled Continue antihypertensive Low-sodium, DASH diet, lifestyle modifications  2. Unilateral recurrent inguinal hernia without obstruction or gangrene Scheduled for hernia repair by [facility]  3. Preoperative clearance Normal EKG Medical clearance form completed and faxed over   No orders of the defined types were placed in this encounter.   Follow-up: Return in about 3 months (around 02/09/2017) for Follow-up on hypertension.   Jaclyn Shaggy MD

## 2016-11-09 NOTE — Patient Instructions (Signed)

## 2016-11-12 ENCOUNTER — Telehealth: Payer: Self-pay | Admitting: Surgery

## 2016-11-12 ENCOUNTER — Telehealth: Payer: Self-pay

## 2016-11-12 NOTE — Telephone Encounter (Signed)
Medical Clearance obtain and approved at this time. Medical clearance scanned and can be found under Media Tab.

## 2016-11-12 NOTE — Telephone Encounter (Signed)
Pt advised of pre op date/time and sx date. Sx: 12/12/16 with Dr Manfred Shirts recurrent right inguinal hernia repair.  Pre op: 12/03/16 @ 9:00am--office.   Patient made aware to call [phone], between 1-3:00pm the day before surgery, to find out what time to arrive.

## 2016-11-20 MED FILL — AMLODIPINE BESYLATE 10 MG T: 10 | 30 days supply | Qty: 30 | Fill #5

## 2016-11-20 MED FILL — ?ATORVASTATIN 20 MG TABLET: 20 | 30 days supply | Qty: 60 | Fill #3

## 2016-12-03 ENCOUNTER — Other Ambulatory Visit: Payer: Self-pay

## 2016-12-03 ENCOUNTER — Ambulatory Visit
Admission: RE | Admit: 2016-12-03 | Discharge: 2016-12-03 | Disposition: A | Discharge status: Home / Self Care | Payer: Self-pay | Source: Ambulatory Visit | Attending: Surgery | Admitting: Surgery

## 2016-12-03 ENCOUNTER — Encounter
Admission: RE | Admit: 2016-12-03 | Discharge: 2016-12-03 | Disposition: A | Discharge status: Home / Self Care | Payer: Self-pay | Source: Ambulatory Visit | Attending: Surgery | Admitting: Surgery

## 2016-12-03 DIAGNOSIS — Z01811 Encounter for preprocedural respiratory examination: Secondary | ICD-10-CM | POA: Insufficient documentation

## 2016-12-03 DIAGNOSIS — K4091 Unilateral inguinal hernia, without obstruction or gangrene, recurrent: Secondary | ICD-10-CM

## 2016-12-03 HISTORY — DX: Unilateral inguinal hernia, without obstruction or gangrene, not specified as recurrent: K40.90

## 2016-12-03 HISTORY — DX: Unspecified asthma, uncomplicated: J45.909

## 2016-12-03 HISTORY — DX: Cerebral infarction, unspecified: I63.9

## 2016-12-03 HISTORY — DX: Unspecified osteoarthritis, unspecified site: M19.90

## 2016-12-03 NOTE — Patient Instructions (Signed)
Your procedure is scheduled on: Wednesday Nov. 14, 2018 Report to Day Surgery. [facility] To find out your arrival time please call [phone] between 1PM - 3PM on Tuesday Nov. 13, 2018.  Remember: Instructions that are not followed completely may result in serious medical risk, up to and including death, or upon the discretion of your surgeon and anesthesiologist your surgery may need to be rescheduled.     _X__ 1. Do not eat food after midnight the night before your procedure.                 No gum chewing or hard candies. You may drink clear liquids up to 2 hours                 before you are scheduled to arrive for your surgery- DO not drink clear                 liquids within 2 hours of the start of your surgery.                 Clear Liquids include:  water, apple juice without pulp, clear carbohydrate                 drink such as Clearfast of Gartorade, Black Coffee or Tea (Do not add                 anything to coffee or tea).     _X__ 2.  No Alcohol for 24 hours before or after surgery.   _X__ 3.  Do Not Smoke or use e-cigarettes For 24 Hours Prior to Your Surgery.                 Do not use any chewable tobacco products for at least 6 hours prior to                 surgery.  ____  4.  Bring all medications with you on the day of surgery if instructed.   ____  5.  Notify your doctor if there is any change in your medical condition      (cold, fever, infections).     Do not wear jewelry, make-up, hairpins, clips or nail polish. Do not wear lotions, powders, or perfumes. Do not shave 48 hours prior to surgery. Men may shave face and neck. Do not bring valuables to the hospital.    [facility] is not responsible for any belongings or valuables.  Contacts, dentures or bridgework may not be worn into surgery. Leave your suitcase in the car. After surgery it may be brought to your room. For patients admitted to the hospital, discharge time is  determined by your treatment team.   Patients discharged the day of surgery will not be allowed to drive home.   Please read over the following fact sheets that you were given:   Advance Directives, living Will, healthcare Power of Attonery Booklet          __x__ Take these medicines the morning of surgery with A SIP OF WATER:    1. Lipitor  2. Tylenol #3   3. Keppra  4.amLOPipine/Norvasc  5.  6.  ____ Fleet Enema (as directed)   ___X_ Use CHG Soap as directed  ___X_ Use inhalers on the day of surgery  ____ Stop metformin 2 days prior to surgery    ____ Take 1/2 of usual insulin dose the night before surgery. No insulin the morning  of surgery.   __X__ Stop Coumadin/Plavix/aspirin on ( NO ASPIRIN)  ___X_ Stop Anti-inflammatories on ( ALEVE< GOODY POWDER, Motrin, IBUPROPEN) May have TYLENOL   ____ Stop supplements until after surgery. ( TUMERIC,GLUCOSAMINE HCL) 7-10 days prior to surgery   ____ Bring C-Pap to the hospital.

## 2016-12-04 MED FILL — LEVETIRACETAM ER 500 MG TAB: 500 | 30 days supply | Qty: 60 | Fill #4

## 2016-12-05 MED FILL — ACETAMINOPHEN/COD #3 TABLET: 300-30 | 20 days supply | Qty: 60 | Fill #0

## 2016-12-05 MED FILL — traZODone HCL 50 MG TABS: 50 | 30 days supply | Qty: 45 | Fill #2

## 2016-12-11 MED ORDER — CEFAZOLIN SODIUM-DEXTROSE 2-4 GM/100ML-% IV SOLN
2.0000 g | INTRAVENOUS | Status: AC
  Administered 2016-12-12: 2 g via INTRAVENOUS

## 2016-12-12 ENCOUNTER — Ambulatory Visit: Payer: Self-pay | Admitting: Certified Registered Nurse Anesthetist

## 2016-12-12 ENCOUNTER — Encounter: Payer: Self-pay | Admitting: Anesthesiology

## 2016-12-12 ENCOUNTER — Other Ambulatory Visit: Payer: Self-pay

## 2016-12-12 ENCOUNTER — Observation Stay
Admission: RE | Admit: 2016-12-12 | Discharge: 2016-12-13 | Disposition: A | Discharge status: Home / Self Care | Payer: No Typology Code available for payment source | Source: Ambulatory Visit | Attending: Surgery | Admitting: Surgery

## 2016-12-12 ENCOUNTER — Encounter
Admission: RE | Disposition: A | Discharge status: Home / Self Care | Payer: Self-pay | Source: Ambulatory Visit | Attending: Surgery

## 2016-12-12 DIAGNOSIS — Z8719 Personal history of other diseases of the digestive system: Secondary | ICD-10-CM | POA: Insufficient documentation

## 2016-12-12 DIAGNOSIS — F17210 Nicotine dependence, cigarettes, uncomplicated: Secondary | ICD-10-CM | POA: Insufficient documentation

## 2016-12-12 DIAGNOSIS — N5082 Scrotal pain: Secondary | ICD-10-CM | POA: Insufficient documentation

## 2016-12-12 DIAGNOSIS — K4091 Unilateral inguinal hernia, without obstruction or gangrene, recurrent: Principal | ICD-10-CM | POA: Diagnosis present

## 2016-12-12 DIAGNOSIS — F411 Generalized anxiety disorder: Secondary | ICD-10-CM | POA: Insufficient documentation

## 2016-12-12 DIAGNOSIS — I10 Essential (primary) hypertension: Secondary | ICD-10-CM | POA: Insufficient documentation

## 2016-12-12 DIAGNOSIS — F329 Major depressive disorder, single episode, unspecified: Secondary | ICD-10-CM | POA: Insufficient documentation

## 2016-12-12 DIAGNOSIS — Z79899 Other long term (current) drug therapy: Secondary | ICD-10-CM | POA: Insufficient documentation

## 2016-12-12 DIAGNOSIS — Z8673 Personal history of transient ischemic attack (TIA), and cerebral infarction without residual deficits: Secondary | ICD-10-CM | POA: Insufficient documentation

## 2016-12-12 HISTORY — PX: INGUINAL HERNIA REPAIR: SHX194

## 2016-12-12 SURGERY — REPAIR, HERNIA, INGUINAL, ADULT
Anesthesia: General | Laterality: Right | Wound class: Clean

## 2016-12-12 MED ORDER — SUGAMMADEX SODIUM 200 MG/2ML IV SOLN
INTRAVENOUS | Status: DC | PRN
  Administered 2016-12-12: 150 mg via INTRAVENOUS

## 2016-12-12 MED ORDER — ONDANSETRON 4 MG PO TBDP: 4.0000 mg | ORAL_TABLET | Freq: Four times a day (QID) | ORAL | Status: DC | PRN

## 2016-12-12 MED ORDER — ENOXAPARIN SODIUM 40 MG/0.4ML ~~LOC~~ SOLN
40.0000 mg | SUBCUTANEOUS | Status: DC
  Administered 2016-12-13: 40 mg via SUBCUTANEOUS
  Filled 2016-12-12: qty 0.4

## 2016-12-12 MED ORDER — PROPOFOL 500 MG/50ML IV EMUL
INTRAVENOUS | Status: AC
  Filled 2016-12-12: qty 50

## 2016-12-12 MED ORDER — EPHEDRINE SULFATE 50 MG/ML IJ SOLN
INTRAMUSCULAR | Status: AC
  Filled 2016-12-12: qty 1

## 2016-12-12 MED ORDER — FAMOTIDINE 20 MG PO TABS
ORAL_TABLET | ORAL | Status: AC
  Administered 2016-12-12: 20 mg via ORAL
  Filled 2016-12-12: qty 1

## 2016-12-12 MED ORDER — ACETAMINOPHEN 10 MG/ML IV SOLN
INTRAVENOUS | Status: AC
  Filled 2016-12-12: qty 100

## 2016-12-12 MED ORDER — CEFAZOLIN SODIUM-DEXTROSE 2-4 GM/100ML-% IV SOLN
INTRAVENOUS | Status: AC
  Filled 2016-12-12: qty 100

## 2016-12-12 MED ORDER — FENTANYL CITRATE (PF) 100 MCG/2ML IJ SOLN
INTRAMUSCULAR | Status: AC
  Filled 2016-12-12: qty 2

## 2016-12-12 MED ORDER — ADULT MULTIVITAMIN W/MINERALS CH
1.0000 | ORAL_TABLET | Freq: Every day | ORAL | Status: DC
  Administered 2016-12-13: 1 via ORAL
  Filled 2016-12-12: qty 1

## 2016-12-12 MED ORDER — ONDANSETRON HCL 4 MG/2ML IJ SOLN: 4.0000 mg | Freq: Once | INTRAMUSCULAR | Status: DC | PRN

## 2016-12-12 MED ORDER — FENTANYL CITRATE (PF) 100 MCG/2ML IJ SOLN
INTRAMUSCULAR | Status: AC
  Administered 2016-12-12: 25 ug via INTRAVENOUS
  Filled 2016-12-12: qty 2

## 2016-12-12 MED ORDER — LIDOCAINE HCL 1 % IJ SOLN
INTRAMUSCULAR | Status: DC | PRN
  Administered 2016-12-12: 20 mL

## 2016-12-12 MED ORDER — LORAZEPAM 2 MG/ML IJ SOLN: 1.0000 mg | Freq: Four times a day (QID) | INTRAMUSCULAR | Status: DC | PRN

## 2016-12-12 MED ORDER — LACTATED RINGERS IV SOLN
INTRAVENOUS | Status: DC
  Administered 2016-12-12: 08:00:00 via INTRAVENOUS

## 2016-12-12 MED ORDER — OXYCODONE-ACETAMINOPHEN 5-325 MG PO TABS
1.0000 | ORAL_TABLET | ORAL | Status: DC | PRN
  Administered 2016-12-12: 1 via ORAL
  Filled 2016-12-12: qty 1

## 2016-12-12 MED ORDER — DEXAMETHASONE SODIUM PHOSPHATE 10 MG/ML IJ SOLN
INTRAMUSCULAR | Status: DC | PRN
  Administered 2016-12-12: 10 mg via INTRAVENOUS

## 2016-12-12 MED ORDER — LIDOCAINE HCL (PF) 2 % IJ SOLN
INTRAMUSCULAR | Status: AC
  Filled 2016-12-12: qty 10

## 2016-12-12 MED ORDER — MORPHINE SULFATE (PF) 2 MG/ML IV SOLN
2.0000 mg | INTRAVENOUS | Status: DC | PRN
  Administered 2016-12-12: 2 mg via INTRAVENOUS
  Filled 2016-12-12: qty 1

## 2016-12-12 MED ORDER — BUPIVACAINE HCL (PF) 0.5 % IJ SOLN
INTRAMUSCULAR | Status: AC
  Filled 2016-12-12: qty 30

## 2016-12-12 MED ORDER — CHLORHEXIDINE GLUCONATE CLOTH 2 % EX PADS: 6.0000 | MEDICATED_PAD | Freq: Once | CUTANEOUS | Status: DC

## 2016-12-12 MED ORDER — GLYCOPYRROLATE 0.2 MG/ML IJ SOLN
INTRAMUSCULAR | Status: DC | PRN
  Administered 2016-12-12: 0.2 mg via INTRAVENOUS

## 2016-12-12 MED ORDER — ATORVASTATIN CALCIUM 20 MG PO TABS
40.0000 mg | ORAL_TABLET | Freq: Every day | ORAL | Status: DC
  Administered 2016-12-13: 40 mg via ORAL
  Filled 2016-12-12: qty 2
  Filled 2016-12-12: qty 1
  Filled 2016-12-12 (×2): qty 2

## 2016-12-12 MED ORDER — ROCURONIUM BROMIDE 50 MG/5ML IV SOLN
INTRAVENOUS | Status: AC
  Filled 2016-12-12: qty 2

## 2016-12-12 MED ORDER — SUCCINYLCHOLINE CHLORIDE 20 MG/ML IJ SOLN
INTRAMUSCULAR | Status: DC | PRN
  Administered 2016-12-12: 120 mg via INTRAVENOUS

## 2016-12-12 MED ORDER — ACETAMINOPHEN 650 MG RE SUPP
650.0000 mg | Freq: Four times a day (QID) | RECTAL | Status: DC | PRN
  Filled 2016-12-12: qty 1

## 2016-12-12 MED ORDER — PANTOPRAZOLE SODIUM 40 MG PO TBEC
40.0000 mg | DELAYED_RELEASE_TABLET | Freq: Every day | ORAL | Status: DC
  Administered 2016-12-12 – 2016-12-13 (×2): 40 mg via ORAL
  Filled 2016-12-12 (×2): qty 1

## 2016-12-12 MED ORDER — FENTANYL CITRATE (PF) 100 MCG/2ML IJ SOLN
25.0000 ug | INTRAMUSCULAR | Status: DC | PRN
  Administered 2016-12-12 (×4): 25 ug via INTRAVENOUS

## 2016-12-12 MED ORDER — LORAZEPAM 1 MG PO TABS
1.0000 mg | ORAL_TABLET | Freq: Four times a day (QID) | ORAL | Status: DC | PRN
  Administered 2016-12-12 (×2): 1 mg via ORAL
  Filled 2016-12-12 (×2): qty 1

## 2016-12-12 MED ORDER — LIDOCAINE HCL (CARDIAC) 20 MG/ML IV SOLN
INTRAVENOUS | Status: DC | PRN
  Administered 2016-12-12: 100 mg via INTRAVENOUS

## 2016-12-12 MED ORDER — ONDANSETRON HCL 4 MG/2ML IJ SOLN: 4.0000 mg | Freq: Four times a day (QID) | INTRAMUSCULAR | Status: DC | PRN

## 2016-12-12 MED ORDER — DEXTROSE IN LACTATED RINGERS 5 % IV SOLN
INTRAVENOUS | Status: DC
  Administered 2016-12-12 – 2016-12-13 (×2): via INTRAVENOUS

## 2016-12-12 MED ORDER — FAMOTIDINE 20 MG PO TABS
20.0000 mg | ORAL_TABLET | Freq: Once | ORAL | Status: AC
  Administered 2016-12-12: 20 mg via ORAL

## 2016-12-12 MED ORDER — THIAMINE HCL 100 MG/ML IJ SOLN: 100.0000 mg | Freq: Every day | INTRAMUSCULAR | Status: DC

## 2016-12-12 MED ORDER — DEXAMETHASONE SODIUM PHOSPHATE 10 MG/ML IJ SOLN
INTRAMUSCULAR | Status: AC
  Filled 2016-12-12: qty 1

## 2016-12-12 MED ORDER — MIDAZOLAM HCL 2 MG/2ML IJ SOLN
INTRAMUSCULAR | Status: DC | PRN
  Administered 2016-12-12: 2 mg via INTRAVENOUS

## 2016-12-12 MED ORDER — ONDANSETRON HCL 4 MG/2ML IJ SOLN
INTRAMUSCULAR | Status: AC
  Filled 2016-12-12: qty 2

## 2016-12-12 MED ORDER — ALBUTEROL SULFATE (2.5 MG/3ML) 0.083% IN NEBU: 3.0000 mL | INHALATION_SOLUTION | Freq: Four times a day (QID) | RESPIRATORY_TRACT | Status: DC | PRN

## 2016-12-12 MED ORDER — FOLIC ACID 1 MG PO TABS
1.0000 mg | ORAL_TABLET | Freq: Every day | ORAL | Status: DC
  Administered 2016-12-12 – 2016-12-13 (×2): 1 mg via ORAL
  Filled 2016-12-12 (×2): qty 1

## 2016-12-12 MED ORDER — LIDOCAINE HCL (PF) 1 % IJ SOLN
INTRAMUSCULAR | Status: AC
  Filled 2016-12-12: qty 30

## 2016-12-12 MED ORDER — PROPOFOL 10 MG/ML IV BOLUS
INTRAVENOUS | Status: DC | PRN
  Administered 2016-12-12: 200 mg via INTRAVENOUS

## 2016-12-12 MED ORDER — SUCCINYLCHOLINE CHLORIDE 20 MG/ML IJ SOLN
INTRAMUSCULAR | Status: AC
  Filled 2016-12-12: qty 1

## 2016-12-12 MED ORDER — GLYCOPYRROLATE 0.2 MG/ML IJ SOLN
INTRAMUSCULAR | Status: AC
  Filled 2016-12-12: qty 1

## 2016-12-12 MED ORDER — FENTANYL CITRATE (PF) 100 MCG/2ML IJ SOLN
INTRAMUSCULAR | Status: DC | PRN
  Administered 2016-12-12 (×4): 50 ug via INTRAVENOUS

## 2016-12-12 MED ORDER — NICOTINE 14 MG/24HR TD PT24
14.0000 mg | MEDICATED_PATCH | Freq: Every day | TRANSDERMAL | Status: DC
  Administered 2016-12-12 – 2016-12-13 (×2): 14 mg via TRANSDERMAL
  Filled 2016-12-12 (×2): qty 1

## 2016-12-12 MED ORDER — ONDANSETRON HCL 4 MG/2ML IJ SOLN
INTRAMUSCULAR | Status: DC | PRN
  Administered 2016-12-12: 4 mg via INTRAVENOUS

## 2016-12-12 MED ORDER — SEVOFLURANE IN SOLN
RESPIRATORY_TRACT | Status: AC
  Filled 2016-12-12: qty 250

## 2016-12-12 MED ORDER — VITAMIN B-1 100 MG PO TABS
100.0000 mg | ORAL_TABLET | Freq: Every day | ORAL | Status: DC
  Administered 2016-12-12 – 2016-12-13 (×2): 100 mg via ORAL
  Filled 2016-12-12 (×2): qty 1

## 2016-12-12 MED ORDER — AMLODIPINE BESYLATE 10 MG PO TABS
10.0000 mg | ORAL_TABLET | Freq: Every day | ORAL | Status: DC
  Administered 2016-12-13: 10 mg via ORAL
  Filled 2016-12-12: qty 1

## 2016-12-12 MED ORDER — DEXTROSE 5 % IV SOLN
2.0000 g | Freq: Three times a day (TID) | INTRAVENOUS | Status: AC
  Administered 2016-12-12: 2 g via INTRAVENOUS
  Filled 2016-12-12: qty 20

## 2016-12-12 MED ORDER — PHENYLEPHRINE HCL 10 MG/ML IJ SOLN
INTRAMUSCULAR | Status: AC
  Filled 2016-12-12: qty 1

## 2016-12-12 MED ORDER — MIDAZOLAM HCL 2 MG/2ML IJ SOLN
INTRAMUSCULAR | Status: AC
  Filled 2016-12-12: qty 2

## 2016-12-12 MED ORDER — LEVETIRACETAM ER 500 MG PO TB24
500.0000 mg | ORAL_TABLET | Freq: Two times a day (BID) | ORAL | Status: DC
  Administered 2016-12-12 – 2016-12-13 (×2): 500 mg via ORAL
  Filled 2016-12-12 (×4): qty 1

## 2016-12-12 MED ORDER — ROCURONIUM BROMIDE 100 MG/10ML IV SOLN
INTRAVENOUS | Status: DC | PRN
  Administered 2016-12-12: 10 mg via INTRAVENOUS
  Administered 2016-12-12: 30 mg via INTRAVENOUS
  Administered 2016-12-12: 10 mg via INTRAVENOUS

## 2016-12-12 MED ORDER — ACETAMINOPHEN 325 MG PO TABS
650.0000 mg | ORAL_TABLET | Freq: Four times a day (QID) | ORAL | Status: DC | PRN
  Administered 2016-12-12: 650 mg via ORAL
  Filled 2016-12-12: qty 2

## 2016-12-12 MED ORDER — CEFAZOLIN SODIUM-DEXTROSE 2-4 GM/100ML-% IV SOLN: 2.0000 g | Freq: Three times a day (TID) | INTRAVENOUS | Status: DC

## 2016-12-12 MED ORDER — ACETAMINOPHEN 10 MG/ML IV SOLN
INTRAVENOUS | Status: DC | PRN
  Administered 2016-12-12: 1000 mg via INTRAVENOUS

## 2016-12-12 SURGICAL SUPPLY — 33 items
ADH SKN CLS APL DERMABOND .7 (GAUZE/BANDAGES/DRESSINGS) ×1
CHLORAPREP W/TINT 26ML (MISCELLANEOUS) ×5 IMPLANT
DECANTER SPIKE VIAL GLASS SM (MISCELLANEOUS) ×6 IMPLANT
DERMABOND ADVANCED (GAUZE/BANDAGES/DRESSINGS) ×2
DERMABOND ADVANCED .7 DNX12 (GAUZE/BANDAGES/DRESSINGS) ×1 IMPLANT
DRAIN PENROSE 5/8X18 LTX STRL (WOUND CARE) ×3 IMPLANT
DRAPE PED LAPAROTOMY (DRAPES) ×3 IMPLANT
ELECT REM PT RETURN 9FT ADLT (ELECTROSURGICAL) ×3
ELECTRODE REM PT RTRN 9FT ADLT (ELECTROSURGICAL) ×1 IMPLANT
GLOVE BIO SURGEON STRL SZ7 (GLOVE) ×9 IMPLANT
GLOVE BIOGEL PI IND STRL 7.5 (GLOVE) ×1 IMPLANT
GLOVE BIOGEL PI INDICATOR 7.5 (GLOVE) ×4
GOWN STRL REUS W/ TWL LRG LVL3 (GOWN DISPOSABLE) ×2 IMPLANT
GOWN STRL REUS W/TWL LRG LVL3 (GOWN DISPOSABLE) ×9
KIT RM TURNOVER STRD PROC AR (KITS) ×3 IMPLANT
NEEDLE HYPO 22GX1.5 SAFETY (NEEDLE) ×3 IMPLANT
NS IRRIG 500ML POUR BTL (IV SOLUTION) ×3 IMPLANT
PACK BASIN MINOR ARMC (MISCELLANEOUS) ×3 IMPLANT
PLUG INGUINAL HERNIA MESH LG (Mesh General) ×2 IMPLANT
SUT ETHIBOND 0 MO6 C/R (SUTURE) ×2 IMPLANT
SUT ETHIBOND CT1 BRD 2-0 30IN (SUTURE) ×1 IMPLANT
SUT ETHIBOND NAB MO 7 #0 18IN (SUTURE) ×1 IMPLANT
SUT MNCRL AB 4-0 PS2 18 (SUTURE) ×3 IMPLANT
SUT SILK 2 0 (SUTURE)
SUT SILK 2 0 SH (SUTURE) ×2 IMPLANT
SUT SILK 2-0 18XBRD TIE 12 (SUTURE) IMPLANT
SUT VIC AB 2-0 CT1 27 (SUTURE) ×3
SUT VIC AB 2-0 CT1 TAPERPNT 27 (SUTURE) ×1 IMPLANT
SUT VIC AB 3-0 54X BRD REEL (SUTURE) IMPLANT
SUT VIC AB 3-0 BRD 54 (SUTURE) ×3
SUT VIC AB 3-0 SH 27 (SUTURE) ×3
SUT VIC AB 3-0 SH 27X BRD (SUTURE) ×1 IMPLANT
SYRINGE 10CC LL (SYRINGE) ×3 IMPLANT

## 2016-12-12 NOTE — Progress Notes (Signed)
Order received for nicoderm patch 

## 2016-12-12 NOTE — Anesthesia Post-op Follow-up Note (Signed)
Anesthesia QCDR form completed.        

## 2016-12-12 NOTE — H&P (Signed)
Surgical Clinic History and Physical  Referring provider:  Quentin AngstJegede, Olugbemiga E, MD [address] [place] [id number]  HISTORY OF PRESENT ILLNESS (HPI):  61 y.o. male presents for evaluation of his chronic large Right inguinal hernia. Patient reports his hernia was repaired when he was a child, and it's recurred >15 years ago. He says it generally hasn't bothered him and he hasn't previously had it repaired due to lack of insurance (now reportedly covered 100% by [facility]), but it's recently started to become uncomfortable and painful with heavy lifting. Patient describes +flatus and denies any constipation, straining with urination, or coughing despite still smoking 1 pack per week. Otherwise, patient has a history of "heavy drinking" until 2 years ago, but now only drinks 1 - 2 alcoholic drinks per week and reports he can walk a block or up/down a flight of steps without CP or SOB, though he says he often uses his inhaler once or sometimes twice per day, but also says sometimes he does not use it at all.  Patient reports no significant changes since the above history was obtained in the office.  PAST MEDICAL HISTORY (PMH):      Past Medical History:  Diagnosis Date  . Anxiety   . Cellulitis 09/20/2015   right elbow  . Chronic low back pain 10/22/2014  . Depression 01/14/2007   Qualifier: Diagnosis of  By: Clent RidgesFry MD, Tera MaterStephen A   . Esophagitis   . Essential hypertension 10/25/2006   Qualifier: Diagnosis of  By: Linna DarnerWyrick, CMA, Cindy    . 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 History of gastric ulcer 12/31/2014  . History of stroke 03/11/2015  . Poor dentition    multiple broken teeth (only few not broken)  . Seizures (HCC) 01/08/2007   alcohol withdrawal, reports last seizure 2016  . Substance abuse (HCC)    alcoholism     PAST SURGICAL HISTORY (PSH):       Past Surgical History:  Procedure Laterality Date  . ABDOMINAL SURGERY    . HAND SURGERY      left hand  . HERNIA REPAIR    . TONSILLECTOMY       MEDICATIONS:         Prior to Admission medications   Medication Sig Start Date End Date Taking? Authorizing Provider  acetaminophen-codeine (TYLENOL #3) 300-30 MG tablet Take 1 tablet by mouth every 8 (eight) hours as needed for moderate pain. 07/12/16  Yes Funches, Josalyn, MD  albuterol (VENTOLIN HFA) 108 (90 Base) MCG/ACT inhaler Inhale 2 puffs into the lungs every 6 (six) hours as needed for wheezing or shortness of breath. 08/08/16  Yes Funches, Josalyn, MD  amLODipine (NORVASC) 10 MG tablet Take 1 tablet (10 mg total) by mouth daily. 05/08/16  Yes Funches, Josalyn, MD  atorvastatin (LIPITOR) 20 MG tablet Take 2 tablets (40 mg total) by mouth daily. 07/19/16  Yes Funches, Josalyn, MD  docusate sodium (COLACE) 100 MG capsule Take 1 capsule (100 mg total) by mouth daily. 02/07/16  Yes Funches, Josalyn, MD  Glucosamine-Chondroitin 750-600 MG TABS Take 2 tablets by mouth daily. 01/12/16  Yes Funches, Gerilyn NestleJosalyn, MD  levETIRAcetam (KEPPRA XR) 500 MG 24 hr tablet Take 2 tablets (1,000 mg total) by mouth daily. 07/12/16  Yes Funches, Gerilyn NestleJosalyn, MD  traZODone (DESYREL) 50 MG tablet Take 1.5 tablets (75 mg total) by mouth at bedtime. 07/12/16  Yes Funches, Josalyn, MD  Turmeric 500 MG CAPS Take 500 mg by mouth daily. 01/12/16  Yes Dessa PhiFunches, Josalyn, MD     ALLERGIES:       Allergies  Allergen Reactions  . Nsaids     Intolerance, hx of gastritis and gastric ulcer     SOCIAL HISTORY:  Social History        Social History  . Marital status: Divorced    Spouse name: N/A  . Number of children: N/A  . Years of education: N/A      Occupational History  . Not on file.         Social History Main Topics  . Smoking status: Current Every Day Smoker    Packs/day: 0.00    Types: Cigarettes  . Smokeless tobacco: Never Used  . Alcohol use 25.2 oz/week    42 Cans of beer per week     Comment: Last drank two  weeks ago per patient.  . Drug use: No  . Sexual activity: Not on file       Other Topics Concern  . Not on file      Social History Narrative  . No narrative on file    The patient currently resides (home / rehab facility / nursing home): Home The patient normally is (ambulatory / bedbound): Ambulatory  FAMILY HISTORY:       Family History  Problem Relation Age of Onset  . Hypertension Mother   . Diabetes Mother   . Hypertension Father   . Alcohol abuse Unknown        fhx  . Diabetes Unknown        fhx  . Hypertension Unknown        fhx  . Hypertension Sister   . Hypertension Brother     Otherwise negative/non-contributory.  REVIEW OF SYSTEMS:  Constitutional: denies any other weight loss, fever, chills, or sweats  Eyes: denies any other vision changes, history of eye injury  ENT: denies sore throat, hearing problems  Respiratory: denies shortness of breath, wheezing  Cardiovascular: denies chest pain, palpitations Gastrointestinal: large Right inguinal hernia, denies abdominal pain, N/V, constipation, or diarrhea Musculoskeletal: denies any other joint pains or cramps  Skin: Denies any other rashes or skin discolorations Neurological: denies any other headache, dizziness, weakness  Psychiatric: Denies any other depression, anxiety   All other review of systems were otherwise negative   VITAL SIGNS:  BP (!) 158/104   Pulse (!) 113   Temp (!) 96.9 F (36.1 C) (Tympanic)   Resp 20   Ht 5\' 8"  (1.727 m)   Wt 163 lb (73.9 kg)   SpO2 100%   BMI 24.78 kg/m   PHYSICAL EXAM:  Constitutional:  -- Normal body habitus  -- Awake, alert, and oriented x3  Eyes:  -- Pupils equally round and reactive to light  -- No scleral icterus  Mouth, Ear, nose, throat:  -- Very poor dentition (most teeth broken and discolored) -- No jugular venous distension -- No nasal drainage, bleeding Pulmonary:  -- No crackles, equal breath sounds bilaterally --  Smells like cigarette smoke, despite reporting only limited smoking -- Breathing non-labored at rest Cardiovascular:  -- S1, S2 present  -- No pericardial rubs  Gastrointestinal: -- Abdomen soft, nontender, non-distended, no guarding/rebound  -- Very large, non-tender, and reducible Right inguinal hernia the size and shape of a football, though reports discomfort/pain in his mid-abdomen when most-all of his hernia contents (at least much of his small intestine) were reduced into his abdomen -- No other abdominal masses appreciated, pulsatile or otherwise  Musculoskeletal and Integumentary:  -- Wounds or skin discoloration: None appreciated except well-healed Right groin linear surgical scar -- Extremities: B/L UE and LE FROM, hands and feet warm, no edema  Neurologic:  -- Motor function: Intact and symmetric -- Sensation: Intact and symmetric  Labs:  CBC Latest Ref Rng & Units 10/15/2016 09/21/2015 09/20/2015  WBC 3.4 - 10.8 x10E3/uL 7.5 9.4 13.3(H)  Hemoglobin 13.0 - 17.7 g/dL 91.4 12.7(L) 13.7  Hematocrit 37.5 - 51.0 % 47.2 36.0(L) 37.8(L)  Platelets 150 - 379 x10E3/uL 185 259 233   CMP Latest Ref Rng & Units 10/15/2016 10/04/2015 09/21/2015  Glucose 65 - 99 mg/dL 89 86 782(N)  BUN 8 - 27 mg/dL 14 13 6   Creatinine 0.76 - 1.27 mg/dL 5.62 1.30 8.65  Sodium 134 - 144 mmol/L 140 135 129(L)  Potassium 3.5 - 5.2 mmol/L 4.2 4.9 2.5(LL)  Chloride 96 - 106 mmol/L 100 100 93(L)  CO2 20 - 29 mmol/L 23 25 28   Calcium 8.6 - 10.2 mg/dL 9.8 9.3 7.8(I)  Total Protein 6.0 - 8.5 g/dL 7.1 - 5.7(L)  Total Bilirubin 0.0 - 1.2 mg/dL 0.8 - 1.1  Alkaline Phos 39 - 117 IU/L 56 - 58  AST 0 - 40 IU/L 160(H) - 38  ALT 0 - 44 IU/L 123(H) - 37     Imaging studies:  CT Abdomen and Pelvis with Oral Contrast Only (11/10/2008) - personally reviewed Minimal atelectasis at lung bases. Large amounts of free air and free GI contrast throughout abdominal cavity compatible with perforated viscus. Significant wall  thickening identified at distal stomach, at antrum and pyloric region, question site of perforation. Bowel loops appear decompressed without associated wall thickening. Tiny umbilical hernia containing fat. No gross focal abnormalities of liver, spleen, pancreas, kidneys, or adrenal glands. Small hiatal hernia. No upper abdominal mass. Single minimally enlarged peripancreatic lymph node 17 x 11 mm image 34.   Extensive free fluid and contrast as noted above. Large right inguinal hernia containing nondilated loops of the large and small bowel as well as free fluid. Bladder unremarkable. Sigmoid diverticulosis without wall thickening. Mildly enlarged prostate gland. No pelvic mass or adenopathy.  Assessment/Plan:  61 y.o. male with a very large and symptomatic reducible recurrent Right inguinal hernia without evidence of obstruction, complicated by co-morbidities including HTN; history of stroke (date unclear, described by PMD as "remote"); heavy alcohol abuse until 2 years ago with esophagitis, gastritis, and perforated gastric ulcer; chronic ongoing tobacco abuse; very poor dentition (most teeth are broken/missing and remaining teeth are discolored); history of seizures (attributed to withdrawal and most recently 2016); generalized anxiety disordere, and major depression disorder.              - no heavy lifting, maintain hydration and high fiber diet advised to minimize constipation              - all risks, benefits, and alternatives to repair of Right inguinal hernia with mesh (including, but not limited to: bleeding, infection, bowel injury, nerve injury, sexual dysfunction, impaired fertility, recurrence, and difficulties due to general anesthesia with intubation) as well as additional risks and possibilities regarding loss of domain and elevated intra-abdominal pressure with replacement of abdominal contents from scrotum, including decompression laparotomy +/- placement of ventral abdominal wall  mesh, omental and/or bowel resection, testicular injury, and need for additional surgery were all discussed with the patient, all of his questions were answered to his expressed satisfaction, patient expresses he wishes to proceed, and informed consent was  accordingly obtained.              - smoking cessation strongly encouraged for general health and to reduce surgical wound and pulmonary complications             - plan is for elective repair of very large, chronic, symptomatic, recurrent Right inguinal hernia as scheduled today, 11/14             - patient understands Dr. Excell Seltzer will assist with procedure due to very large size of recurrent inguinal hernia             - appreciate medical risk stratification and optimization prior to surgery, considering comorbidities  All of the above recommendations were discussed with the patient, and all of patient's questions were answered to his expressed satisfaction.  Thank you for the opportunity to participate in this patient's care.  -- Scherrie Gerlach Earlene Plater, MD, RPVI [place]: [facility] General [facility] - Partnering for exceptional care. Office: [phone]

## 2016-12-12 NOTE — Op Note (Signed)
SURGICAL OPERATIVE REPORT  DATE OF PROCEDURE: 12/12/2016  ATTENDING Surgeon(s): Ancil Linseyavis, Amara Justen Evan, MD  ASSISTANT(S): Lattie Hawooper, Richard E, MD  ANESTHESIA: GETA  PRE-OPERATIVE DIAGNOSIS: Recurrent Very Large (>football-sized) symptomatic reducible Right inguinal hernia (icd-10: K40.91)  POST-OPERATIVE DIAGNOSIS: Recurrent Very Large (>football-sized) symptomatic reducible Right inguinal hernia (icd-10: K40.91)  PROCEDURE(S):  1.) Repair of Very Large (>football-sized) Recurrent symptomatic (painful) reducible Right inguinal hernia with mesh (cpt: 37169: 49520)  INTRAOPERATIVE FINDINGS: Very Large (>football-sized) Recurrent pantaloon Right inguinal hernia containing peritoneal fat, small intestine, and colon at the time of surgery with very large both indirect and direct components  INTRAVENOUS FLUIDS: 1200 mL crystalloid   ESTIMATED BLOOD LOSS: Minimal (<20 mL)  URINE OUTPUT: 400 mL  SPECIMENS: None  IMPLANTS: Bard PerFix mesh plug and Bard Soft polypropylene mesh patch  DRAINS: None   COMPLICATIONS: None apparent   CONDITION AT END OF PROCEDURE: Hemodynamically stable and extubated   DISPOSITION OF PATIENT: PACU  INDICATIONS FOR PROCEDURE:  Patient is a 61 y.o. male who presented with recently worsened Right groin pain x 15 years following remote prior Right inguinal hernia repair. On exam, a Very Large (>football-sized) Reducible Right inguinal hernia was identified, contributing factors were assessed, and operative repair was offered. All risks, benefits, and alternatives to above procedure were discussed with the patient, all of patient's questions were answered to his expressed satisfaction, and informed consent was obtained and documented. Dr. Earmon Phoenixooper's assistance was requested due to the hernia's Very Large (>football) size, chronicity, and recurrence with its anticipated difficulty to repair.  DETAILS OF PROCEDURE: Patient was brought to the operating suite and  appropriately identified. General anesthesia was administered along with appropriate pre-operative antibiotics, and endotracheal intubation was performed by anesthetist. In supine position, operative site was prepped and draped in the usual sterile fashion, and following a brief time out, local anesthetic was injected over the planned incision site and initial transverse skin incision was made along a natural skin crease using a #15 blade scalpel 1-2 cm superior to the inguinal ligament as determined by identifying and marking the anterior superior iliac spine and pubic tubercle. This incision was then extended deep through subcutaneous tissues, Scarpa's and Camper's fascia, using electrocautery until external oblique aponeurosis was encountered and the external ring was exposed. A small incision was made in the mid- external oblique aponeurosis in the direction of its fibers, elevated off underlying structures, and extended medially and laterally. The ilioinguinal nerve was identified and protected throughout the remainder of dissection and procedure, and superior and inferior external oblique flaps were developed bluntly.  Following extensive dissection of the enormous hernia sac off of the adherent spermatic cord structures, with which Dr. Earmon Phoenixooper's assistance was essential and appreciated, the spermatic cord was safely encircled with a penrose drain and carefully dissected free from the anteromedial hernia sac, which was opened and its contents reduced and the hernia sac was ligated and excised. Attention was then directed to the floor of the inguinal canal, which appeared grossly weakened with an also very large and well-defined defect. A polypropylene mesh plug was inserted into the direct hernia defect, and an appropriately fashioned mesh patch was sutured inferiorly to the shelving edge of the inguinal ligament, starting medially at the pubic tubercle, using interrupted Ethibond suture and superiorly  running Ethibond suture(s) to the conjoint tendon/transversalis muscle/fascia. Care was taken to assure the mesh was placed in a relaxed fashion without excessive tension and without any neurovascular structures incorporated into the repair. Laterally, the tails of the  mesh were then crossed to recreate the internal ring. Hemostasis was confirmed, and the incision was closed in multiple layers using a running 2-0 Vicryl suture to re-approximate the external oblique aponeurosis, interrupted 3-0 Vicryl deep dermal sutures, and running 4-0 Monocryl subcuticular suture to re-approximate epidermis. The skin was then cleaned, dried, and sterile skin glue was applied. Patient was then safely able to be extubated, awakened, and transferred to PACU for post-operative monitoring and care.  I was present for all aspects of the above procedure, and there were no complications apparent.

## 2016-12-12 NOTE — Anesthesia Postprocedure Evaluation (Signed)
Anesthesia Post Note  Patient: Jared Reyes  Procedure(s) Performed: HERNIA REPAIR INGUINAL ADULT recurrent  (Right )  Patient location during evaluation: PACU Anesthesia Type: General Level of consciousness: awake and alert Pain management: pain level controlled Vital Signs Assessment: post-procedure vital signs reviewed and stable Respiratory status: spontaneous breathing, nonlabored ventilation, respiratory function stable and patient connected to nasal cannula oxygen Cardiovascular status: blood pressure returned to baseline and stable Postop Assessment: no apparent nausea or vomiting Anesthetic complications: no     Last Vitals:  Vitals:   12/12/16 1328 12/12/16 1333  BP: (!) 155/89   Pulse: (!) 109 (!) 105  Resp: 20 19  Temp:    SpO2: 96% 97%    Last Pain:  Vitals:   12/12/16 1333  TempSrc:   PainSc: 4                  Larrissa Stivers S

## 2016-12-12 NOTE — Anesthesia Preprocedure Evaluation (Signed)
Anesthesia Evaluation  Patient identified by MRN, date of birth, ID band Patient awake    Reviewed: Allergy & Precautions, NPO status , Patient's Chart, lab work & pertinent test results, reviewed documented beta blocker date and time   Airway Mallampati: II  TM Distance: >3 FB Neck ROM: Full    Dental no notable dental hx. (+) Poor Dentition   Pulmonary asthma , Current Smoker,    Pulmonary exam normal breath sounds clear to auscultation       Cardiovascular hypertension, Pt. on medications Normal cardiovascular exam Rhythm:Regular Rate:Normal     Neuro/Psych Seizures -,  PSYCHIATRIC DISORDERS Anxiety Depression CVA    GI/Hepatic   Endo/Other    Renal/GU      Musculoskeletal  (+) Arthritis ,   Abdominal   Peds  Hematology   Anesthesia Other Findings ETOH. Multiple broken teeth.  Reproductive/Obstetrics                             Anesthesia Physical Anesthesia Plan  ASA: III  Anesthesia Plan: General   Post-op Pain Management:    Induction: Intravenous  PONV Risk Score and Plan:   Airway Management Planned: LMA  Additional Equipment:   Intra-op Plan:   Post-operative Plan:   Informed Consent: I have reviewed the patients History and Physical, chart, labs and discussed the procedure including the risks, benefits and alternatives for the proposed anesthesia with the patient or authorized representative who has indicated his/her understanding and acceptance.     Plan Discussed with: CRNA  Anesthesia Plan Comments:         Anesthesia Quick Evaluation

## 2016-12-12 NOTE — Anesthesia Procedure Notes (Signed)
Procedure Name: Intubation Date/Time: 12/12/2016 9:45 AM Performed by: Ginger CarneMichelet, Shaunte Weissinger, CRNA Pre-anesthesia Checklist: Patient identified, Emergency Drugs available, Suction available, Patient being monitored and Timeout performed Patient Re-evaluated:Patient Re-evaluated prior to induction Oxygen Delivery Method: Circle system utilized Preoxygenation: Pre-oxygenation with 100% oxygen Induction Type: IV induction Ventilation: Mask ventilation without difficulty and Oral airway inserted - appropriate to patient size Laryngoscope Size: Hyacinth MeekerMiller and 2 Grade View: Grade I Tube type: Oral Tube size: 7.5 mm Number of attempts: 1 Airway Equipment and Method: Stylet Placement Confirmation: ETT inserted through vocal cords under direct vision,  positive ETCO2 and breath sounds checked- equal and bilateral Secured at: 22 cm Tube secured with: Tape Dental Injury: Teeth and Oropharynx as per pre-operative assessment  Difficulty Due To: Difficulty was anticipated and Difficult Airway- due to dentition

## 2016-12-12 NOTE — Transfer of Care (Signed)
Immediate Anesthesia Transfer of Care Note  Patient: Jared Reyes  Procedure(s) Performed: HERNIA REPAIR INGUINAL ADULT recurrent  (Right )  Patient Location: PACU  Anesthesia Type:General  Level of Consciousness: awake, alert  and oriented  Airway & Oxygen Therapy: Patient Spontanous Breathing and Patient connected to face mask oxygen  Post-op Assessment: report given to RN, Vitals reviewed and stable  Post vital signs: Reviewed and stable  Last Vitals:  Vitals:   12/12/16 0805 12/12/16 1211  BP: (!) 158/104 123/79  Pulse: (!) 113 97  Resp: 20 16  Temp: (!) 36.1 C 37.3 C  SpO2: 100% 100%    Last Pain:  Vitals:   12/12/16 0805  TempSrc: Tympanic      Patients Stated Pain Goal: 2 (12/12/16 0805)  Complications: No apparent anesthesia complications

## 2016-12-13 LAB — BASIC METABOLIC PANEL
ANION GAP: 11 (ref 5–15)
BUN: 8 mg/dL (ref 6–20)
CALCIUM: 8.6 mg/dL — AB (ref 8.9–10.3)
CO2: 30 mmol/L (ref 22–32)
Chloride: 95 mmol/L — ABNORMAL LOW (ref 101–111)
Creatinine, Ser: 0.8 mg/dL (ref 0.61–1.24)
GFR calc Af Amer: >60 mL/min (ref >60)
GLUCOSE: 125 mg/dL — AB (ref 65–99)
Potassium: 2.9 mmol/L — ABNORMAL LOW (ref 3.5–5.1)
Sodium: 136 mmol/L (ref 135–145)

## 2016-12-13 LAB — CBC
HCT: 42.8 % (ref 40.0–52.0)
Hemoglobin: 14.7 g/dL (ref 13.0–18.0)
MCH: 33.6 pg (ref 26.0–34.0)
MCHC: 34.2 g/dL (ref 32.0–36.0)
MCV: 98.1 fL (ref 80.0–100.0)
PLATELETS: 219 10*3/uL (ref 150–440)
RBC: 4.37 MIL/uL — ABNORMAL LOW (ref 4.40–5.90)
RDW: 14 % (ref 11.5–14.5)
WBC: 9.8 10*3/uL (ref 3.8–10.6)

## 2016-12-13 MED ORDER — POTASSIUM CHLORIDE CRYS ER 20 MEQ PO TBCR
40.0000 meq | EXTENDED_RELEASE_TABLET | ORAL | Status: AC
  Administered 2016-12-13 (×2): 40 meq via ORAL
  Filled 2016-12-13 (×2): qty 2

## 2016-12-13 MED ORDER — OXYCODONE-ACETAMINOPHEN 5-325 MG PO TABS: 1.0000 | ORAL_TABLET | ORAL | 0 refills | Status: DC | PRN

## 2016-12-13 NOTE — Discharge Instructions (Addendum)
In addition to included general post-operative instructions for Open Repair of Very Large and Recurrent Right Inguinal Hernia,  Diet: Resume home heart healthy diet.   Activity: No heavy lifting >15 - 20 pounds x 6 weeks (children, pets, laundry, garbage) or strenuous activity (this is very important to reduce risk of recurrence), but light activity and walking are encouraged. Do not drive or drink alcohol if taking narcotic pain medication.  Wound care: 2 days after surgery (Friday, 11/16), may shower/get incision wet with soapy water and pat dry (do not rub incisions), but no baths or submerging incision underwater until follow-up. Scrotal support is encouraged to reduce swelling and pain.  Medications: Resume all home medications. For mild to moderate pain: acetaminophen (Tylenol) or ibuprofen (if no kidney disease). Combining Tylenol with alcohol can substantially increase your risk of causing liver disease. Narcotic pain medications, if prescribed, can be used for severe pain, though may cause nausea, constipation, and drowsiness. Do not combine Tylenol and Percocet (or similar) within a 6 hour period as Percocet (and similar) contain(s) Tylenol. If you do not need the narcotic pain medication, you do not need to fill the prescription.  Call office [phone]) at any time if any questions, worsening pain, fevers/chills, bleeding, drainage from incision site, or other concerns.

## 2016-12-13 NOTE — Care Management (Signed)
Patient to discharge home today.  Self pay.  PCP AMAO, ENOBONG.  Patient provided with application to [facility], Medication Management, "The Network:  Your Guide to [employer] [facility] [employer] [facility] [place]"  Booklet.  Patient to discharge on percocet.  Coupon provided from goodrx.com for $9.14 RNCM signing off.

## 2016-12-13 NOTE — Discharge Summary (Signed)
Physician Discharge Summary  Patient ID: Jared Reyes MRN: [id number] DOB/AGE: Dec 27, 1955 61 y.o.  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 male [facility] elective open repair of very large symptomatic and recurrent Right inguinal hernia with mesh. Informed consent was obtained and documented, and patient underwent uneventful open repair of very large symptomatic and recurrent Right inguinal hernia with mesh (Dr. Earlene Plateravis and Dr. Excell Seltzerooper, 12/12/2016).  Post-operatively, patient's Right groin/scrotal discomfort improved with only mild rather easily-/well- controlled peri-incisional pain, while advancement of patient's diet and ambulation were well-tolerated. Patient's potassium was found to be low, for which he normally takes oral supplements but did not do so the day of surgery, and his potassium was corrected. The remainder of patient's hospital course was essentially unremarkable, and discharge planning was initiated accordingly with patient safely able to be discharged home with appropriate discharge instructions, scrotal support, pain control, and outpatient surgical follow-up after all of his questions were answered to his expressed satisfaction.  Consults: None  Treatments: surgery: Open repair of very large and recurrent symptomatic Right inguinal hernia with mesh  Discharge Exam: Blood pressure (!) 144/82, pulse 81, temperature 98.1 F (36.7 C), temperature source Oral, resp. rate 19, height 5\' 8"  (1.727 m), weight 163 lb (73.9 kg), SpO2 100 %. General appearance: alert, cooperative and no distress GI: Abdomen soft, NT, and ND with Right groin incision well-approximated without any surrounding erythema, ecchymosis, or drainage; +scrotal swelling with palpable B/L testicles in  scrotum (Right smaller than Left, unchanged from pre-op)  Disposition: 07-Left Against Medical Advice/Left Without Being Seen/Elopement   Allergies as of 12/13/2016      Reactions   Nsaids Other (See Comments)   Intolerance, hx of gastritis and gastric ulcer      Medication List    TAKE these medications   acetaminophen-codeine 300-30 MG tablet Commonly known as:  TYLENOL #3 Take 1 tablet by mouth every 8 (eight) hours as needed for moderate pain.   albuterol 108 (90 Base) MCG/ACT inhaler Commonly known as:  VENTOLIN HFA Inhale 2 puffs into the lungs every 6 (six) hours as needed for wheezing or shortness of breath.   amLODipine 10 MG tablet Commonly known as:  NORVASC Take 1 tablet (10 mg total) by mouth daily.   atorvastatin 20 MG tablet Commonly known as:  LIPITOR Take 2 tablets (40 mg total) by mouth daily.   docusate sodium 100 MG capsule Commonly known as:  COLACE Take 1 capsule (100 mg total) by mouth daily.   GLUCOSAMINE PO Take 1 tablet by mouth 2 (two) times a week.   levETIRAcetam 500 MG 24 hr tablet Commonly known as:  KEPPRA XR Take 2 tablets (1,000 mg total) by mouth daily. What changed:    how much to take  when to take this   oxyCODONE-acetaminophen 5-325 MG tablet Commonly known as:  PERCOCET/ROXICET Take 1-2 tablets every 4 (four) hours as needed by mouth for severe pain.   traZODone 50 MG tablet Commonly known as:  DESYREL Take 1.5 tablets (75 mg total) by mouth at bedtime.   Turmeric 500 MG Caps Take 500 mg by mouth daily.      Follow-up Information    Ancil Linseyavis, Maicee Ullman Evan, MD. Schedule an appointment as soon as possible for a visit in 2 week(s).   Specialty:  General Surgery Contact information: [address] [place] [id number] [phone]           Signed: Ancil LinseyJason Evan Janis Cuffe 12/13/2016, 9:34 AM

## 2016-12-13 NOTE — Progress Notes (Signed)
Patient cleared for discharge by Dr Earlene Plateravis and CM signed off. AVS printed. Patient discharge instructions explained and all questions answered. IV removed. Belongings gathered. Volunteers called to transport patient to entrance.

## 2016-12-14 ENCOUNTER — Encounter: Payer: Self-pay | Admitting: Surgery

## 2016-12-18 MED FILL — !VENTOLIN HFA INHALER: 108 (90 BAS | 25 days supply | Qty: 18 | Fill #0

## 2016-12-31 ENCOUNTER — Other Ambulatory Visit: Payer: Self-pay

## 2016-12-31 MED ORDER — ATORVASTATIN CALCIUM 20 MG PO TABS: 40.0000 mg | ORAL_TABLET | Freq: Every day | ORAL | 3 refills | Status: DC

## 2016-12-31 MED FILL — AMLODIPINE BESYLATE 10 MG T: 10 | 30 days supply | Qty: 30 | Fill #0

## 2016-12-31 MED FILL — ATORVASTATIN 20 MG TABLET: 20 | 30 days supply | Qty: 60 | Fill #0

## 2017-01-02 ENCOUNTER — Ambulatory Visit (INDEPENDENT_AMBULATORY_CARE_PROVIDER_SITE_OTHER): Payer: No Typology Code available for payment source | Admitting: Surgery

## 2017-01-02 ENCOUNTER — Encounter: Payer: Self-pay | Admitting: Surgery

## 2017-01-02 VITALS — BP 189/104 | HR 170 | Temp 98.1°F | Ht 68.0 in | Wt 155.8 lb

## 2017-01-02 DIAGNOSIS — K4091 Unilateral inguinal hernia, without obstruction or gangrene, recurrent: Secondary | ICD-10-CM

## 2017-01-02 NOTE — Patient Instructions (Signed)
Please be sure to pick up your blood pressure medicine from the pharmacy today and start taking it as soon as possible.  GENERAL POST-OPERATIVE PATIENT INSTRUCTIONS   WOUND CARE INSTRUCTIONS:  Keep a dry clean dressing on the wound if there is drainage. The initial bandage may be removed after 24 hours.  Once the wound has quit draining you may leave it open to air.  If clothing rubs against the wound or causes irritation and the wound is not draining you may cover it with a dry dressing during the daytime.  Try to keep the wound dry and avoid ointments on the wound unless directed to do so.  If the wound becomes bright red and painful or starts to drain infected material that is not clear, please contact your physician immediately.  If the wound is mildly pink and has a thick firm ridge underneath it, this is normal, and is referred to as a healing ridge.  This will resolve over the next 4-6 weeks.  BATHING: You may shower if you have been informed of this by your surgeon. However, Please do not submerge in a tub, hot tub, or pool until incisions are completely sealed or have been told by your surgeon that you may do so.  DIET:  You may eat any foods that you can tolerate.  It is a good idea to eat a high fiber diet and take in plenty of fluids to prevent constipation.  If you do become constipated you may want to take a mild laxative or take ducolax tablets on a daily basis until your bowel habits are regular.  Constipation can be very uncomfortable, along with straining, after recent surgery.  ACTIVITY:  You are encouraged to cough and deep breath or use your incentive spirometer if you were given one, every 15-30 minutes when awake.  This will help prevent respiratory complications and low grade fevers post-operatively if you had a general anesthetic.  You may want to hug a pillow when coughing and sneezing to add additional support to the surgical area, if you had abdominal or chest surgery, which  will decrease pain during these times.  You are encouraged to walk and engage in light activity for the next two weeks.  You should not lift more than 20 pounds, until 12/12/2016/ as it could put you at increased risk for complications.  Twenty pounds is roughly equivalent to a plastic bag of groceries. At that time- Listen to your body when lifting, if you have pain when lifting, stop and then try again in a few days. Soreness after doing exercises or activities of daily living is normal as you get back in to your normal routine.  MEDICATIONS:  Try to take narcotic medications and anti-inflammatory medications, such as tylenol, ibuprofen, naprosyn, etc., with food.  This will minimize stomach upset from the medication.  Should you develop nausea and vomiting from the pain medication, or develop a rash, please discontinue the medication and contact your physician.  You should not drive, make important decisions, or operate machinery when taking narcotic pain medication.  SUNBLOCK Use sun block to incision area over the next year if this area will be exposed to sun. This helps decrease scarring and will allow you avoid a permanent darkened area over your incision.  QUESTIONS:  Please feel free to call our office if you have any questions, and we will be glad to assist you. [phone]

## 2017-01-02 NOTE — Progress Notes (Signed)
Surgical Clinic Progress/Follow-up Note   HPI:  61 y.o. Male presents to clinic for post-op follow-up 3 weeks s/p open repair of recurrent giant symptomatic Right inguinal hernia with mesh. Patient reports he feels much better without scrotal chaffing/"rubbing" that he previously experienced with any walking, denies Right groin pain, numbness, abdominal pain, N/V, fever/chills, CP, or SOB. He also denies any heavy lifting or strenuous activities and says he's been able to wear briefs underwear for the first time in years. Of note, patient attributes his severe HTN this morning to running out of his anti-HTN meds he plans to pick up and take on his way home from his appointment this morning.  Review of Systems:  Constitutional: denies any other weight loss, fever, chills, or sweats  Eyes: denies any other vision changes, history of eye injury  ENT: denies sore throat, hearing problems  Respiratory: denies shortness of breath, wheezing  Cardiovascular: denies chest pain, palpitations  Gastrointestinal: abdominal pain, N/V, and bowel function as per HPI Musculoskeletal: denies any other joint pains or cramps  Skin: Denies any other rashes or skin discolorations  Neurological: denies any other headache, dizziness, weakness  Psychiatric: denies any other depression, anxiety  All other review of systems: otherwise negative   Vital Signs:  BP (!) 189/104   Pulse (!) 170   Temp 98.1 F (36.7 C) (Oral)   Ht 5\' 8"  (1.727 m)   Wt 155 lb 12.8 oz (70.7 kg)   BMI 23.69 kg/m    Physical Exam:  Constitutional:  -- Normal body habitus  -- Awake, alert, and oriented x3  Eyes:  -- Pupils equally round and reactive to light  -- No scleral icterus  Ear, nose, throat:  -- Very poor dentition, missing several teeth -- No jugular venous distension  -- No nasal drainage, bleeding Pulmonary:  -- No crackles -- Equal breath sounds bilaterally -- Breathing non-labored at rest Cardiovascular:  --  S1, S2 present  -- No pericardial rubs  Gastrointestinal:  -- Soft, nontender, non-distended, no guarding/rebound  -- No abdominal masses appreciated, pulsatile or otherwise  Genitourinary: -- Non-tender moderate-sized scrotal seroma without ecchymosis or erythema, B/L testicles descended -- Right groin incision well-approximated and NT without any erythema or drainage Musculoskeletal / Integumentary:  -- Wounds or skin discoloration: None appreciated except as described above (Genitourinary)  -- Extremities: B/L UE and LE FROM, hands and feet warm, no edema  Neurologic:  -- Motor function: intact and symmetric  -- Sensation: intact and symmetric   Assessment:  61 y.o. yo Male with a problem list including...  Patient Active Problem List   Diagnosis Date Noted  . 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 Cigarette smoker 10/15/2016  . Smokers' cough (HCC) 07/14/2016  . History of bilateral cataract extraction 07/26/2015  . Allergic rhinoconjunctivitis of left eye 04/26/2015  . History of stroke 03/11/2015  . History of gastric ulcer 12/31/2014  . Abnormal urinalysis 12/03/2014  . Chronic low back pain 10/22/2014  . Inguinal hernia 09/30/2014  . Insomnia 09/25/2013  . Seizures (HCC) 07/11/2007  . Depression 01/14/2007  . Chronic anxiety 10/25/2006  . Alcohol use (HCC) 10/25/2006  . Essential hypertension 10/25/2006  . ESOPHAGITIS NOS 10/25/2006    presents to clinic for post-op follow-up evaluation s/p open repair of recurrent giant symptomatic Right inguinal hernia with mesh, doing well from a strictly surgical perspective with an asymptomatic scrotal seroma, but with HTN urgency at today's appointment.  Plan:   -  advised ED evaluation/management for HTN urgency  - explained risks of stroke (recurrent), aortic dissection, etc, but patient refuses, says will pick up and take medications after appointment  - if not ED, patient should  have BP checked after takes anti-HTN medication, outpatient follow-up for HTN with patient's medical clinic physician  - continue scrotal support with no heavy lifting or strenuous activity x 4 more weeks  - offered follow-up evaluation for scrotal seroma, but patient requests prn  - instructed to call office if any questions or concerns  All of the above recommendations were discussed with the patient, and all of patient's questions were answered to his expressed satisfaction.  -- Scherrie GerlachJason E. Earlene Plateravis, MD, RPVI [place]: [facility] - Partnering for exceptional care. Office: [phone]

## 2017-01-16 MED FILL — LEVETIRACETAM ER 500 MG TAB: 500 | 30 days supply | Qty: 60 | Fill #5

## 2017-01-16 MED FILL — traZODone HCL 50 MG TABS: 50 | 30 days supply | Qty: 45 | Fill #3

## 2017-02-12 ENCOUNTER — Encounter: Payer: Self-pay | Admitting: Family Medicine

## 2017-02-12 ENCOUNTER — Ambulatory Visit: Payer: Self-pay | Attending: Family Medicine | Admitting: Family Medicine

## 2017-02-12 VITALS — BP 151/74 | HR 123 | Temp 98.1°F | Ht 68.0 in | Wt 147.8 lb

## 2017-02-12 DIAGNOSIS — Z789 Other specified health status: Secondary | ICD-10-CM

## 2017-02-12 DIAGNOSIS — G8929 Other chronic pain: Secondary | ICD-10-CM | POA: Insufficient documentation

## 2017-02-12 DIAGNOSIS — Z886 Allergy status to analgesic agent status: Secondary | ICD-10-CM | POA: Insufficient documentation

## 2017-02-12 DIAGNOSIS — I10 Essential (primary) hypertension: Secondary | ICD-10-CM | POA: Insufficient documentation

## 2017-02-12 DIAGNOSIS — Z79899 Other long term (current) drug therapy: Secondary | ICD-10-CM | POA: Insufficient documentation

## 2017-02-12 DIAGNOSIS — Z8719 Personal history of other diseases of the digestive system: Secondary | ICD-10-CM | POA: Insufficient documentation

## 2017-02-12 DIAGNOSIS — R569 Unspecified convulsions: Secondary | ICD-10-CM | POA: Insufficient documentation

## 2017-02-12 DIAGNOSIS — M1990 Unspecified osteoarthritis, unspecified site: Secondary | ICD-10-CM | POA: Insufficient documentation

## 2017-02-12 DIAGNOSIS — Z9889 Other specified postprocedural states: Secondary | ICD-10-CM | POA: Insufficient documentation

## 2017-02-12 DIAGNOSIS — Z7289 Other problems related to lifestyle: Secondary | ICD-10-CM

## 2017-02-12 DIAGNOSIS — F1010 Alcohol abuse, uncomplicated: Secondary | ICD-10-CM | POA: Insufficient documentation

## 2017-02-12 DIAGNOSIS — J45909 Unspecified asthma, uncomplicated: Secondary | ICD-10-CM | POA: Insufficient documentation

## 2017-02-12 DIAGNOSIS — M79605 Pain in left leg: Secondary | ICD-10-CM

## 2017-02-12 DIAGNOSIS — M545 Low back pain: Secondary | ICD-10-CM | POA: Insufficient documentation

## 2017-02-12 DIAGNOSIS — M79604 Pain in right leg: Secondary | ICD-10-CM | POA: Insufficient documentation

## 2017-02-12 DIAGNOSIS — Z8673 Personal history of transient ischemic attack (TIA), and cerebral infarction without residual deficits: Secondary | ICD-10-CM | POA: Insufficient documentation

## 2017-02-12 MED ORDER — LISINOPRIL 5 MG PO TABS: 5.0000 mg | ORAL_TABLET | Freq: Every day | ORAL | 3 refills | Status: DC

## 2017-02-12 MED ORDER — LEVETIRACETAM ER 500 MG PO TB24: 1000.0000 mg | ORAL_TABLET | Freq: Every day | ORAL | 3 refills | Status: DC

## 2017-02-12 MED ORDER — GABAPENTIN 300 MG PO CAPS: 300.0000 mg | ORAL_CAPSULE | Freq: Two times a day (BID) | ORAL | 3 refills | Status: DC

## 2017-02-12 MED ORDER — AMLODIPINE BESYLATE 10 MG PO TABS: 10.0000 mg | ORAL_TABLET | Freq: Every day | ORAL | 3 refills | Status: DC

## 2017-02-12 MED ORDER — ATORVASTATIN CALCIUM 40 MG PO TABS: 40.0000 mg | ORAL_TABLET | Freq: Every day | ORAL | 3 refills | Status: DC

## 2017-02-12 MED ORDER — METHOCARBAMOL 750 MG PO TABS: 750.0000 mg | ORAL_TABLET | Freq: Two times a day (BID) | ORAL | 3 refills | Status: DC | PRN

## 2017-02-12 MED FILL — LEVETIRACETAM ER 500 MG TAB: 500 | 30 days supply | Qty: 60 | Fill #0

## 2017-02-12 MED FILL — GABAPENTIN 300 MG CAPSULE: 300 | 30 days supply | Qty: 60 | Fill #0

## 2017-02-12 MED FILL — AMLODIPINE BESYLATE 10 MG T: 10 | 30 days supply | Qty: 30 | Fill #0

## 2017-02-12 MED FILL — ?ATORVASTATIN 40MG TABLET: 40 | 30 days supply | Qty: 30 | Fill #0

## 2017-02-12 MED FILL — LISINOPRIL 5 MG TAB: 5 | 30 days supply | Qty: 30 | Fill #0

## 2017-02-12 MED FILL — METHOCARBAMOL 750 MG TABS: 750 | 30 days supply | Qty: 60 | Fill #0

## 2017-02-12 NOTE — Progress Notes (Signed)
 Subjective:  Patient ID: Jared Reyes, male    DOB: 04/14/1955  Age: 61 y.o. MRN: [id number]  CC: Hypertension   HPI Jared Reyes is a 61-year-old male with a history of hypertension, seizures,tobacco abuse, repair of recurrent giant R inguinoscrotal hernia with mesh in 11/2016 who presents for a follow up visit. Followed by Dr Jason Davis and has had post op visits; his last visit was in 01/02/17.  He complains of a 5 day history of bilateral leg pains to the point that he has to ambulate with a walker. States his legs feel weak and he has pain when attempting to bend them. He consumes alcohol but informs me he is trying to quit with his last drink 2 years ago. Denies numbness in legs.  He has a history of seizures and is on Keppra; last seizure was in 2016. He brings in a DMV form requesting completion so he can keep his drivers'  license. He is requesting a refill of Tylenol #3 which he states he received from his previous PCP for low back pain and states he took the pills sparingly. Pain is absent now.  Past Medical History:  Diagnosis Date  . Anxiety   . Arthritis   . Asthma   . Cellulitis 09/20/2015   right elbow  . Chronic low back pain 10/22/2014  . Depression 01/14/2007   Qualifier: Diagnosis of  By: Fry MD, Stephen A   . Esophagitis   . Essential hypertension 10/25/2006   Qualifier: Diagnosis of  By: Wyrick, CMA, Cindy    . 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 Hernia, inguinal    right  . History of gastric ulcer 12/31/2014  . History of stroke 03/11/2015  . Poor dentition    multiple broken teeth (only few not broken)  . Seizures (HCC) 01/08/2007   alcohol withdrawal, reports last seizure 2016  . Stroke (HCC)   . Substance abuse (HCC)    alcoholism    Past Surgical History:  Procedure Laterality Date  . ABDOMINAL SURGERY    . EYE SURGERY     cataracts bilateral 2008  . FRACTURE SURGERY  1978   surgery on 2,3,4 digits left hand  . HAND  SURGERY     left hand  . HERNIA REPAIR Right    as a child  . INGUINAL HERNIA REPAIR Right 12/12/2016   Procedure: HERNIA REPAIR INGUINAL ADULT recurrent ;  Surgeon: Davis, Jason Evan, MD;  Location: [facility] ORS;  Service: General;  Laterality: Right;  . TONSILLECTOMY      Allergies  Allergen Reactions  . Nsaids Other (See Comments)    Intolerance, hx of gastritis and gastric ulcer     Outpatient Medications Prior to Visit  Medication Sig Dispense Refill  . albuterol (VENTOLIN HFA) 108 (90 Base) MCG/ACT inhaler Inhale 2 puffs into the lungs every 6 (six) hours as needed for wheezing or shortness of breath. 54 g 3  . docusate sodium (COLACE) 100 MG capsule Take 1 capsule (100 mg total) by mouth daily. 30 capsule 5  . Glucosamine HCl (GLUCOSAMINE PO) Take 1 tablet by mouth 2 (two) times a week.    . traZODone (DESYREL) 50 MG tablet Take 1.5 tablets (75 mg total) by mouth at bedtime. 45 tablet 5  . Turmeric 500 MG CAPS Take 500 mg by mouth daily.    . acetaminophen-codeine (TYLENOL #3) 300-30 MG tablet Take 1 tablet by mouth every 8 (eight) hours as needed for   moderate pain. 60 tablet 2  . amLODipine (NORVASC) 10 MG tablet Take 1 tablet (10 mg total) by mouth daily. 30 tablet 5  . atorvastatin (LIPITOR) 20 MG tablet Take 2 tablets (40 mg total) by mouth daily. 60 tablet 3  . levETIRAcetam (KEPPRA XR) 500 MG 24 hr tablet Take 2 tablets (1,000 mg total) by mouth daily. (Patient taking differently: Take 500 mg by mouth 2 (two) times daily. ) 60 tablet 5  . oxyCODONE-acetaminophen (PERCOCET/ROXICET) 5-325 MG tablet Take 1-2 tablets every 4 (four) hours as needed by mouth for severe pain. (Patient not taking: Reported on 02/12/2017) 30 tablet 0   No facility-administered medications prior to visit.     ROS Review of Systems  Constitutional: Negative for activity change and appetite change.  HENT: Negative for sinus pressure and sore throat.   Eyes: Negative for visual disturbance.    Respiratory: Negative for cough, chest tightness and shortness of breath.   Cardiovascular: Negative for chest pain and leg swelling.  Gastrointestinal: Negative for abdominal distention, abdominal pain, constipation and diarrhea.  Endocrine: Negative.   Genitourinary: Negative for dysuria.  Musculoskeletal: Positive for gait problem.       Leg pains  Skin: Negative for rash.  Allergic/Immunologic: Negative.   Neurological: Negative for weakness, light-headedness and numbness.  Psychiatric/Behavioral: Negative for dysphoric mood and suicidal ideas.    Objective:  BP (!) 151/74   Pulse (!) 123   Temp 98.1 F (36.7 C) (Oral)   Ht 5' 8" (1.727 m)   Wt 147 lb 12.8 oz (67 kg)   SpO2 99%   BMI 22.47 kg/m   BP/Weight 02/12/2017 01/02/2017 12/13/2016  Systolic BP 151 189 137  Diastolic BP 74 104 88  Wt. (Lbs) 147.8 155.8 -  BMI 22.47 23.69 -  Some encounter information is confidential and restricted. Go to Review Flowsheets activity to see all data.      Physical Exam  Constitutional: He is oriented to person, place, and time. He appears well-developed and well-nourished.  Cardiovascular: Normal rate, normal heart sounds and intact distal pulses.  No murmur heard. Pulmonary/Chest: Effort normal and breath sounds normal. He has no wheezes. He has no rales. He exhibits no tenderness.  Abdominal: Soft. Bowel sounds are normal. He exhibits no distension and no mass. There is no tenderness.  Musculoskeletal: He exhibits tenderness (slight tenderness on active ROM of both lower extremities). He exhibits no edema.  No TTP of the lower back or on ROM  Neurological: He is alert and oriented to person, place, and time.  Psychiatric: He has a normal mood and affect.   CMP Latest Ref Rng & Units 12/13/2016 10/15/2016 10/04/2015  Glucose 65 - 99 mg/dL 125(H) 89 86  BUN 6 - 20 mg/dL 8 14 13  Creatinine 0.61 - 1.24 mg/dL 0.80 0.93 1.08  Sodium 135 - 145 mmol/L 136 140 135  Potassium 3.5 - 5.1  mmol/L 2.9(L) 4.2 4.9  Chloride 101 - 111 mmol/L 95(L) 100 100  CO2 22 - 32 mmol/L 30 23 25  Calcium 8.9 - 10.3 mg/dL 8.6(L) 9.8 9.3  Total Protein 6.0 - 8.5 g/dL - 7.1 -  Total Bilirubin 0.0 - 1.2 mg/dL - 0.8 -  Alkaline Phos 39 - 117 IU/L - 56 -  AST 0 - 40 IU/L - 160(H) -  ALT 0 - 44 IU/L - 123(H) -     Assessment & Plan:   1. Essential hypertension Uncontrolled Low dose Lisinopril added especially since his last   labs revealed hypokalemia Counseled on blood pressure goal of less than 130/80, low-sodium, DASH diet, medication compliance, 150 minutes of moderate intensity exercise per week. Discussed medication compliance, adverse effects. - amLODipine (NORVASC) 10 MG tablet; Take 1 tablet (10 mg total) by mouth daily.  Dispense: 30 tablet; Refill: 3 - CMP14+EGFR  2. Seizures ([place]) Last seizure was in 2016 Will complete DMV form at next visit - levETIRAcetam (KEPPRA XR) 500 MG 24 hr tablet; Take 2 tablets (1,000 mg total) by mouth daily.  Dispense: 60 tablet; Refill: 3  3. Alcohol use ([place]) States last drink was 2 days ago. - Ethanol  4. Pain in both lower extremities Possibly secondary to hypokalemia - will check potassium level  5. Chronic low back pain Pain is absent at the moment Placed on Gabapentin and Robaxin No indication for chronic opioid use at this time  Meds ordered this encounter  Medications  . amLODipine (NORVASC) 10 MG tablet    Sig: Take 1 tablet (10 mg total) by mouth daily.    Dispense:  30 tablet    Refill:  3  . atorvastatin (LIPITOR) 40 MG tablet    Sig: Take 1 tablet (40 mg total) by mouth daily.    Dispense:  30 tablet    Refill:  3  . lisinopril (PRINIVIL,ZESTRIL) 5 MG tablet    Sig: Take 1 tablet (5 mg total) by mouth daily.    Dispense:  30 tablet    Refill:  3  . methocarbamol (ROBAXIN-750) 750 MG tablet    Sig: Take 1 tablet (750 mg total) by mouth 2 (two) times daily as needed for muscle spasms.    Dispense:  60 tablet    Refill:   3  . gabapentin (NEURONTIN) 300 MG capsule    Sig: Take 1 capsule (300 mg total) by mouth 2 (two) times daily.    Dispense:  60 capsule    Refill:  3  . levETIRAcetam (KEPPRA XR) 500 MG 24 hr tablet    Sig: Take 2 tablets (1,000 mg total) by mouth daily.    Dispense:  60 tablet    Refill:  3    Follow-up: Return in about 1 week (around 02/19/2017) for follow up on leg pains and DMV form.   Arnoldo Morale MD

## 2017-02-12 NOTE — Patient Instructions (Signed)
Epilepsy °Epilepsy is a condition in which a person has repeated seizures over time. A seizure is a sudden burst of abnormal electrical and chemical activity in the brain. Seizures can cause a change in attention, behavior, or the ability to remain awake and alert (altered mental status). °Epilepsy increases a person's risk of falls, accidents, and injury. It can also lead to complications, including: °· Depression. °· Poor memory. °· Sudden unexplained death in epilepsy (SUDEP). This complication is rare, and its cause is not known. ° °Most people with epilepsy lead normal lives. °What are the causes? °This condition may be caused by: °· A head injury. °· An injury that happens at birth. °· A high fever during childhood. °· A stroke. °· Bleeding that goes into or around the brain. °· Certain medicines and drugs. °· Having too little oxygen for a long period of time. °· Abnormal brain development. °· Certain infections, such as meningitis and encephalitis. °· Brain tumors. °· Conditions that are passed along from parent to child (are hereditary). ° °What are the signs or symptoms? °Symptoms of a seizure vary greatly from person to person. They include: °· Convulsions. °· Stiffening of the body. °· Involuntary movements of the arms or legs. °· Loss of consciousness. °· Breathing problems. °· Falling suddenly. °· Confusion. °· Head nodding. °· Eye blinking or fluttering. °· Lip smacking. °· Drooling. °· Rapid eye movements. °· Grunting. °· Loss of bladder control and bowel control. °· Staring. °· Unresponsiveness. ° °Some people have symptoms right before a seizure happens (aura) and right after a seizure happens. Symptoms of an aura include: °· Fear or anxiety. °· Nausea. °· Feeling like the room is spinning (vertigo). °· A feeling of having seen or heard something before (deja vu). °· Odd tastes or smells. °· Changes in vision, such as seeing flashing lights or spots. ° °Symptoms that follow a seizure  include: °· Confusion. °· Sleepiness. °· Headache. ° °How is this diagnosed? °This condition is diagnosed based on: °· Your symptoms. °· Your medical history. °· A physical exam. °· A neurological exam. A neurological exam is similar to a physical exam. It involves checking your strength, reflexes, coordination, and sensations. °· Tests, such as: °? An electroencephalogram (EEG). This is a painless test that creates a diagram of your brain waves. °? An MRI of the brain. °? A CT scan of the brain. °? A lumbar puncture, also called a spinal tap. °? Blood tests to check for signs of infection or abnormal blood chemistry. ° °How is this treated? °There is no cure for this condition, but treatment can help control seizures. Treatment may involve: °· Taking medicines to control seizures. These include medicines to prevent seizures and medicines to stop seizures as they occur. °· Having a device called a vagus nerve stimulator implanted in the chest. The device sends electrical impulses to the vagus nerve and to the brain to prevent seizures. This treatment may be recommended if medicines do not help. °· Brain surgery. There are several kinds of surgeries that may be done to stop seizures from happening or to reduce how often seizures happen. °· Having regular blood tests. You may need to have blood tests regularly to check that you are getting the right amount of medicine. ° °Once this condition has been diagnosed, it is important to begin treatment as soon as possible. For some people, epilepsy eventually goes away. °Follow these instructions at home: °Medicines ° °· Take over-the-counter and prescription medicines only as   told by your health care provider. °· Avoid any substances that may prevent your medicine from working properly, such as alcohol. °Activity °· Get enough rest. Lack of sleep can make seizures more likely to occur. °· Follow instructions from your health care provider about driving, swimming, and doing  any other activities that would be dangerous if you had a seizure. °Educating others °Teach friends and family what to do if you have a seizure. They should: °· Lay you on the ground to prevent a fall. °· Cushion your head and body. °· Loosen any tight clothing around your neck. °· Turn you on your side. If vomiting occurs, this helps keep your airway clear. °· Stay with you until you recover. °· Not hold you down. Holding you down will not stop the seizure. °· Not put anything in your mouth. °· Know whether or not you need emergency care. ° °General instructions °· Avoid anything that has ever triggered a seizure for you. °· Keep a seizure diary. Record what you remember about each seizure, especially anything that might have triggered the seizure. °· Keep all follow-up visits as told by your health care provider. This is important. °Contact a health care provider if: °· Your seizure pattern changes. °· You have symptoms of infection or another illness. This might increase your risk of having a seizure. °Get help right away if: °· You have a seizure that does not stop after 5 minutes. °· You have several seizures in a row without a complete recovery in between seizures. °· You have a seizure that makes it harder to breathe. °· You have a seizure that is different from previous seizures. °· You have a seizure that leaves you unable to speak or use a part of your body. °· You did not wake up immediately after a seizure. °This information is not intended to replace advice given to you by your health care provider. Make sure you discuss any questions you have with your health care provider. °Document Released: 01/15/2005 Document Revised: 08/13/2015 Document Reviewed: 07/26/2015 °Elsevier Interactive Patient Education © 2018 [employer] ° °

## 2017-02-14 ENCOUNTER — Other Ambulatory Visit: Payer: Self-pay

## 2017-02-14 ENCOUNTER — Encounter (HOSPITAL_COMMUNITY): Payer: Self-pay

## 2017-02-14 ENCOUNTER — Inpatient Hospital Stay (HOSPITAL_COMMUNITY)
Admission: EM | Admit: 2017-02-14 | Discharge: 2017-02-16 | DRG: 641 | Disposition: A | Discharge status: Home / Self Care | Payer: Self-pay | Attending: Internal Medicine | Admitting: Internal Medicine

## 2017-02-14 ENCOUNTER — Other Ambulatory Visit: Payer: Self-pay | Admitting: Family Medicine

## 2017-02-14 ENCOUNTER — Telehealth: Payer: Self-pay

## 2017-02-14 DIAGNOSIS — E785 Hyperlipidemia, unspecified: Secondary | ICD-10-CM

## 2017-02-14 DIAGNOSIS — G40909 Epilepsy, unspecified, not intractable, without status epilepticus: Secondary | ICD-10-CM | POA: Diagnosis present

## 2017-02-14 DIAGNOSIS — M545 Low back pain, unspecified: Secondary | ICD-10-CM | POA: Diagnosis present

## 2017-02-14 DIAGNOSIS — R569 Unspecified convulsions: Secondary | ICD-10-CM

## 2017-02-14 DIAGNOSIS — Z79899 Other long term (current) drug therapy: Secondary | ICD-10-CM

## 2017-02-14 DIAGNOSIS — Z8673 Personal history of transient ischemic attack (TIA), and cerebral infarction without residual deficits: Secondary | ICD-10-CM

## 2017-02-14 DIAGNOSIS — R531 Weakness: Secondary | ICD-10-CM

## 2017-02-14 DIAGNOSIS — G8929 Other chronic pain: Secondary | ICD-10-CM | POA: Diagnosis present

## 2017-02-14 DIAGNOSIS — J45909 Unspecified asthma, uncomplicated: Secondary | ICD-10-CM | POA: Diagnosis present

## 2017-02-14 DIAGNOSIS — I10 Essential (primary) hypertension: Secondary | ICD-10-CM | POA: Diagnosis present

## 2017-02-14 DIAGNOSIS — Z886 Allergy status to analgesic agent status: Secondary | ICD-10-CM

## 2017-02-14 DIAGNOSIS — E876 Hypokalemia: Principal | ICD-10-CM

## 2017-02-14 LAB — CMP14+EGFR
ALK PHOS: 62 IU/L (ref 39–117)
ALT: 130 IU/L — AB (ref 0–44)
AST: 436 IU/L — ABNORMAL HIGH (ref 0–40)
Albumin/Globulin Ratio: 1.8 (ref 1.2–2.2)
Albumin: 4.5 g/dL (ref 3.6–4.8)
BILIRUBIN TOTAL: 1.2 mg/dL (ref 0.0–1.2)
BUN/Creatinine Ratio: 16 (ref 10–24)
BUN: 14 mg/dL (ref 8–27)
CHLORIDE: 80 mmol/L — AB (ref 96–106)
CO2: 23 mmol/L (ref 20–29)
Calcium: 9.4 mg/dL (ref 8.6–10.2)
Creatinine, Ser: 0.85 mg/dL (ref 0.76–1.27)
GFR calc non Af Amer: 94 mL/min/{1.73_m2} (ref >59)
GFR, EST AFRICAN AMERICAN: 109 mL/min/{1.73_m2} (ref >59)
GLUCOSE: 106 mg/dL — AB (ref 65–99)
Globulin, Total: 2.5 g/dL (ref 1.5–4.5)
Potassium: 2.4 mmol/L — CL (ref 3.5–5.2)
Sodium: 130 mmol/L — ABNORMAL LOW (ref 134–144)
TOTAL PROTEIN: 7 g/dL (ref 6.0–8.5)

## 2017-02-14 LAB — BASIC METABOLIC PANEL
Anion gap: 17 — ABNORMAL HIGH (ref 5–15)
BUN: 11 mg/dL (ref 6–20)
CHLORIDE: 87 mmol/L — AB (ref 101–111)
CO2: 27 mmol/L (ref 22–32)
CREATININE: 0.85 mg/dL (ref 0.61–1.24)
Calcium: 8.6 mg/dL — ABNORMAL LOW (ref 8.9–10.3)
Glucose, Bld: 117 mg/dL — ABNORMAL HIGH (ref 65–99)
Potassium: 2.5 mmol/L — CL (ref 3.5–5.1)
SODIUM: 131 mmol/L — AB (ref 135–145)

## 2017-02-14 LAB — CBC
HCT: 36.8 % — ABNORMAL LOW (ref 39.0–52.0)
HCT: 34.4 % — ABNORMAL LOW (ref 39.0–52.0)
HEMOGLOBIN: 12.7 g/dL — AB (ref 13.0–17.0)
Hemoglobin: 13.4 g/dL (ref 13.0–17.0)
MCH: 33.7 pg (ref 26.0–34.0)
MCH: 34.1 pg — AB (ref 26.0–34.0)
MCHC: 36.4 g/dL — ABNORMAL HIGH (ref 30.0–36.0)
MCHC: 36.9 g/dL — AB (ref 30.0–36.0)
MCV: 92.5 fL (ref 78.0–100.0)
MCV: 92.5 fL (ref 78.0–100.0)
PLATELETS: 214 10*3/uL (ref 150–400)
Platelets: 185 10*3/uL (ref 150–400)
RBC: 3.98 MIL/uL — AB (ref 4.22–5.81)
RBC: 3.72 MIL/uL — ABNORMAL LOW (ref 4.22–5.81)
RDW: 13.3 % (ref 11.5–15.5)
RDW: 13.8 % (ref 11.5–15.5)
WBC: 7.4 10*3/uL (ref 4.0–10.5)
WBC: 5.7 10*3/uL (ref 4.0–10.5)

## 2017-02-14 LAB — URINALYSIS, ROUTINE W REFLEX MICROSCOPIC
BILIRUBIN URINE: NEGATIVE
GLUCOSE, UA: NEGATIVE mg/dL
KETONES UR: 20 mg/dL — AB
LEUKOCYTES UA: NEGATIVE
NITRITE: NEGATIVE
PH: 6 (ref 5.0–8.0)
Protein, ur: 100 mg/dL — AB
SQUAMOUS EPITHELIAL / LPF: NONE SEEN
Specific Gravity, Urine: 1.016 (ref 1.005–1.030)

## 2017-02-14 LAB — NA AND K (SODIUM & POTASSIUM), RAND UR
POTASSIUM UR: 7 mmol/L
Sodium, Ur: 91 mmol/L

## 2017-02-14 LAB — CREATININE, SERUM
CREATININE: 0.79 mg/dL (ref 0.61–1.24)
GFR calc Af Amer: >60 mL/min (ref >60)
GFR calc non Af Amer: >60 mL/min (ref >60)

## 2017-02-14 LAB — MAGNESIUM: Magnesium: 1.3 mg/dL — ABNORMAL LOW (ref 1.7–2.4)

## 2017-02-14 LAB — ETHANOL: ETHANOL LVL: NEGATIVE %

## 2017-02-14 LAB — POTASSIUM: POTASSIUM: 2.4 mmol/L — AB (ref 3.5–5.1)

## 2017-02-14 MED ORDER — POTASSIUM CHLORIDE 10 MEQ/100ML IV SOLN
10.0000 meq | INTRAVENOUS | Status: AC
  Administered 2017-02-14 (×2): 10 meq via INTRAVENOUS
  Filled 2017-02-14 (×2): qty 100

## 2017-02-14 MED ORDER — SODIUM CHLORIDE 0.9% FLUSH
3.0000 mL | Freq: Two times a day (BID) | INTRAVENOUS | Status: DC
  Administered 2017-02-15 – 2017-02-16 (×2): 3 mL via INTRAVENOUS

## 2017-02-14 MED ORDER — MAGNESIUM SULFATE 2 GM/50ML IV SOLN
2.0000 g | Freq: Once | INTRAVENOUS | Status: AC
  Administered 2017-02-14: 2 g via INTRAVENOUS
  Filled 2017-02-14: qty 50

## 2017-02-14 MED ORDER — ACETAMINOPHEN 650 MG RE SUPP: 650.0000 mg | Freq: Four times a day (QID) | RECTAL | Status: DC | PRN

## 2017-02-14 MED ORDER — LEVETIRACETAM ER 500 MG PO TB24
1000.0000 mg | ORAL_TABLET | Freq: Every day | ORAL | Status: DC
  Administered 2017-02-15 – 2017-02-16 (×2): 1000 mg via ORAL
  Filled 2017-02-14 (×3): qty 2

## 2017-02-14 MED ORDER — ONDANSETRON HCL 4 MG PO TABS: 4.0000 mg | ORAL_TABLET | Freq: Four times a day (QID) | ORAL | Status: DC | PRN

## 2017-02-14 MED ORDER — HYDROCODONE-ACETAMINOPHEN 5-325 MG PO TABS
1.0000 | ORAL_TABLET | ORAL | Status: DC | PRN
  Administered 2017-02-16: 1 via ORAL
  Filled 2017-02-14: qty 1

## 2017-02-14 MED ORDER — POTASSIUM CHLORIDE 10 MEQ/100ML IV SOLN: 10.0000 meq | Freq: Once | INTRAVENOUS | Status: DC

## 2017-02-14 MED ORDER — POTASSIUM CHLORIDE CRYS ER 20 MEQ PO TBCR
40.0000 meq | EXTENDED_RELEASE_TABLET | Freq: Once | ORAL | Status: AC
  Administered 2017-02-14: 40 meq via ORAL
  Filled 2017-02-14: qty 2

## 2017-02-14 MED ORDER — ENOXAPARIN SODIUM 40 MG/0.4ML ~~LOC~~ SOLN
40.0000 mg | SUBCUTANEOUS | Status: DC
  Administered 2017-02-14 – 2017-02-15 (×2): 40 mg via SUBCUTANEOUS
  Filled 2017-02-14 (×2): qty 0.4

## 2017-02-14 MED ORDER — ZOLPIDEM TARTRATE 5 MG PO TABS: 5.0000 mg | ORAL_TABLET | Freq: Every evening | ORAL | Status: DC | PRN

## 2017-02-14 MED ORDER — METHOCARBAMOL 750 MG PO TABS: 750.0000 mg | ORAL_TABLET | Freq: Two times a day (BID) | ORAL | Status: DC | PRN

## 2017-02-14 MED ORDER — ACETAMINOPHEN 325 MG PO TABS: 650.0000 mg | ORAL_TABLET | Freq: Four times a day (QID) | ORAL | Status: DC | PRN

## 2017-02-14 MED ORDER — ONDANSETRON HCL 4 MG/2ML IJ SOLN: 4.0000 mg | Freq: Four times a day (QID) | INTRAMUSCULAR | Status: DC | PRN

## 2017-02-14 MED ORDER — POTASSIUM CHLORIDE CRYS ER 20 MEQ PO TBCR
20.0000 meq | EXTENDED_RELEASE_TABLET | Freq: Two times a day (BID) | ORAL | 0 refills | Status: DC
Start: 2017-02-14 — End: 2017-02-14

## 2017-02-14 MED ORDER — GABAPENTIN 300 MG PO CAPS
300.0000 mg | ORAL_CAPSULE | Freq: Two times a day (BID) | ORAL | Status: DC
  Administered 2017-02-14 – 2017-02-16 (×4): 300 mg via ORAL
  Filled 2017-02-14 (×4): qty 1

## 2017-02-14 MED ORDER — TRAZODONE HCL 50 MG PO TABS
75.0000 mg | ORAL_TABLET | Freq: Every day | ORAL | Status: DC
  Administered 2017-02-14 – 2017-02-15 (×2): 75 mg via ORAL
  Filled 2017-02-14 (×2): qty 2

## 2017-02-14 MED ORDER — DOCUSATE SODIUM 100 MG PO CAPS
100.0000 mg | ORAL_CAPSULE | Freq: Every day | ORAL | Status: DC
  Filled 2017-02-14 (×2): qty 1

## 2017-02-14 MED ORDER — AMLODIPINE BESYLATE 10 MG PO TABS: 10.0000 mg | ORAL_TABLET | Freq: Every day | ORAL | Status: DC

## 2017-02-14 MED ORDER — ATORVASTATIN CALCIUM 40 MG PO TABS
40.0000 mg | ORAL_TABLET | Freq: Every day | ORAL | Status: DC
  Administered 2017-02-14 – 2017-02-16 (×3): 40 mg via ORAL
  Filled 2017-02-14 (×4): qty 1

## 2017-02-14 MED ORDER — LISINOPRIL 5 MG PO TABS
5.0000 mg | ORAL_TABLET | Freq: Every day | ORAL | Status: DC
  Administered 2017-02-15: 5 mg via ORAL
  Filled 2017-02-14 (×2): qty 1

## 2017-02-14 MED FILL — POTASSIUM CL ER 20 MEQ TAB: 20 | 7 days supply | Qty: 15 | Fill #0

## 2017-02-14 NOTE — Telephone Encounter (Signed)
Patient was called and informed of lab results and informed to go to the ED to receive IV potassium.

## 2017-02-14 NOTE — H&P (Signed)
History and Physical    Jared Reyes ZDG:[id number] DOB: [birth year] DOA: 02/14/2017  PCP: Jaclyn Shaggy, MD  Patient coming from: home  Chief Complaint: abnormal labs  HPI: Jared Reyes is a 62 y.o. male with medical history significant of hypertension, chronic low back pain, recent inguinal hernia repair, seizure disorder, hyperlipidemia who presents after having a routine blood work completed at primary care physician's office which revealed hypokalemia.  He was advised to be evaluated in the emergency department.  He has no acute complaints aside from bilateral lower extremity leg weakness.  He states that since his inguinal surgery, he has been not as active as his normal.  About a week ago, he has noted that his legs are much weaker and has been using a walker to ambulate.  He denies any fevers, chills, chest pain, shortness of breath, nausea, vomiting, abdominal pain, diarrhea, dysuria.  ED Course: Labs obtained which revealed hypokalemia with potassium 2.5, hypomagnesemia 1.3.  He was given IV magnesium, IV potassium and oral potassium replacement and [facility] asked to observe patient overnight.  Review of Systems: As per HPI otherwise 10 point review of systems negative.   Past Medical History:  Diagnosis Date  . Anxiety   . Arthritis   . Asthma   . Cellulitis 09/20/2015   right elbow  . Chronic low back pain 10/22/2014  . Depression 01/14/2007   Qualifier: Diagnosis of  By: Clent Ridges MD, Tera Mater   . Esophagitis   . Essential hypertension 10/25/2006   Qualifier: Diagnosis of  By: Linna Darner, CMA, Cindy    . 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 Hernia, inguinal    right  . History of gastric ulcer 12/31/2014  . History of stroke 03/11/2015  . Poor dentition    multiple broken teeth (only few not broken)  . Seizures (HCC) 01/08/2007   alcohol withdrawal, reports last seizure 2016  . Stroke (HCC)   . Substance abuse (HCC)    alcoholism    Past Surgical History:    Procedure Laterality Date  . ABDOMINAL SURGERY    . EYE SURGERY     cataracts bilateral 2008  . FRACTURE SURGERY  1978   surgery on 2,3,4 digits left hand  . HAND SURGERY     left hand  . HERNIA REPAIR Right    as a child  . INGUINAL HERNIA REPAIR Right 12/12/2016   Procedure: HERNIA REPAIR INGUINAL ADULT recurrent ;  Surgeon: Ancil Linsey, MD;  Location: [facility] ORS;  Service: General;  Laterality: Right;  . TONSILLECTOMY       reports that he has been smoking cigarettes.  He has been smoking about 0.00 packs per day. he has never used smokeless tobacco. He reports that he drinks about 25.2 oz of alcohol per week. He reports that he does not use drugs.  Allergies  Allergen Reactions  . Nsaids Other (See Comments)    Intolerance, hx of gastritis and gastric ulcer    Family History  Problem Relation Age of Onset  . Hypertension Mother   . Diabetes Mother   . Hypertension Father   . Alcohol abuse Unknown        fhx  . Diabetes Unknown        fhx  . Hypertension Unknown        fhx  . Hypertension Sister   . Hypertension Brother     Prior to Admission medications   Medication Sig Start Date End Date Taking?  Authorizing Provider  albuterol (VENTOLIN HFA) 108 (90 Base) MCG/ACT inhaler Inhale 2 puffs into the lungs every 6 (six) hours as needed for wheezing or shortness of breath. 08/08/16   Funches, Gerilyn Nestle, MD  amLODipine (NORVASC) 10 MG tablet Take 1 tablet (10 mg total) by mouth daily. 02/12/17   Jaclyn Shaggy, MD  atorvastatin (LIPITOR) 40 MG tablet Take 1 tablet (40 mg total) by mouth daily. 02/12/17   Jaclyn Shaggy, MD  docusate sodium (COLACE) 100 MG capsule Take 1 capsule (100 mg total) by mouth daily. 02/07/16   Funches, Gerilyn Nestle, MD  gabapentin (NEURONTIN) 300 MG capsule Take 1 capsule (300 mg total) by mouth 2 (two) times daily. 02/12/17   Jaclyn Shaggy, MD  Glucosamine HCl (GLUCOSAMINE PO) Take 1 tablet by mouth 2 (two) times a week.    [provider]   levETIRAcetam (KEPPRA XR) 500 MG 24 hr tablet Take 2 tablets (1,000 mg total) by mouth daily. 02/12/17   Jaclyn Shaggy, MD  lisinopril (PRINIVIL,ZESTRIL) 5 MG tablet Take 1 tablet (5 mg total) by mouth daily. 02/12/17   Jaclyn Shaggy, MD  methocarbamol (ROBAXIN-750) 750 MG tablet Take 1 tablet (750 mg total) by mouth 2 (two) times daily as needed for muscle spasms. 02/12/17   Jaclyn Shaggy, MD  oxyCODONE-acetaminophen (PERCOCET/ROXICET) 5-325 MG tablet Take 1-2 tablets every 4 (four) hours as needed by mouth for severe pain. Patient not taking: Reported on 02/12/2017 12/13/16   Ancil Linsey, MD  potassium chloride SA (K-DUR,KLOR-CON) 20 MEQ tablet Take 1 tablet (20 mEq total) by mouth 2 (two) times daily. 02/14/17   Jaclyn Shaggy, MD  traZODone (DESYREL) 50 MG tablet Take 1.5 tablets (75 mg total) by mouth at bedtime. 07/12/16   Funches, Gerilyn Nestle, MD  Turmeric 500 MG CAPS Take 500 mg by mouth daily. 01/12/16   Dessa Phi, MD    Physical Exam: Vitals:   02/14/17 1545 02/14/17 1615 02/14/17 1630 02/14/17 1645  BP: 101/78 (!) 132/94 116/80 126/82  Pulse: 92 94 88 90  Resp: 15 12 18  (!) 23  Temp:      TempSrc:      SpO2: 100% 100% 100% 100%  Weight:      Height:        Constitutional: NAD, calm, comfortable Eyes: PERRL, lids and conjunctivae normal ENMT: Mucous membranes are moist. Posterior pharynx clear of any exudate or lesions. Neck: normal, supple, no masses, no thyromegaly Respiratory: clear to auscultation bilaterally, no wheezing, no crackles. Normal respiratory effort. No accessory muscle use.  Cardiovascular: Regular rate and rhythm, no murmurs / rubs / gallops. No extremity edema. Abdomen: no tenderness, no masses palpated. No hepatosplenomegaly. Bowel sounds positive.  Musculoskeletal: no clubbing / cyanosis. No joint deformity upper and lower extremities. Good ROM, no contractures. Normal muscle tone.  Skin: no rashes, lesions, ulcers. No induration Neurologic: CN 2-12  grossly intact. Strength 5/5 in all 4. Weakness is not appreciated on exam.  Psychiatric: Normal judgment and insight. Alert and oriented x 3. Normal mood.   Labs on Admission: I have personally reviewed following labs and imaging studies  CBC: Recent Labs  Lab 02/14/17 1333  WBC 7.4  HGB 13.4  HCT 36.8*  MCV 92.5  PLT 214   Basic Metabolic Panel: Recent Labs  Lab 02/12/17 1539 02/14/17 1333  NA 130* 131*  K 2.4* 2.5*  CL 80* 87*  CO2 23 27  GLUCOSE 106* 117*  BUN 14 11  CREATININE 0.85 0.85  CALCIUM 9.4 8.6*  MG  --  1.3*   GFR: Estimated Creatinine Clearance: 85.5 mL/min (by C-G formula based on SCr of 0.85 mg/dL). Liver Function Tests: Recent Labs  Lab 02/12/17 1539  AST 436*  ALT 130*  ALKPHOS 62  BILITOT 1.2  PROT 7.0  ALBUMIN 4.5   No results for input(s): LIPASE, AMYLASE in the last 168 hours. No results for input(s): AMMONIA in the last 168 hours. Coagulation Profile: No results for input(s): INR, PROTIME in the last 168 hours. Cardiac Enzymes: No results for input(s): CKTOTAL, CKMB, CKMBINDEX, TROPONINI in the last 168 hours. BNP (last 3 results) No results for input(s): PROBNP in the last 8760 hours. HbA1C: No results for input(s): HGBA1C in the last 72 hours. CBG: No results for input(s): GLUCAP in the last 168 hours. Lipid Profile: No results for input(s): CHOL, HDL, LDLCALC, TRIG, CHOLHDL, LDLDIRECT in the last 72 hours. Thyroid Function Tests: No results for input(s): TSH, T4TOTAL, FREET4, T3FREE, THYROIDAB in the last 72 hours. Anemia Panel: No results for input(s): VITAMINB12, FOLATE, FERRITIN, TIBC, IRON, RETICCTPCT in the last 72 hours. Urine analysis:    Component Value Date/Time   COLORURINE AMBER (A) 02/14/2017 1326   APPEARANCEUR CLEAR 02/14/2017 1326   LABSPEC 1.016 02/14/2017 1326   PHURINE 6.0 02/14/2017 1326   GLUCOSEU NEGATIVE 02/14/2017 1326   HGBUR LARGE (A) 02/14/2017 1326   BILIRUBINUR NEGATIVE 02/14/2017 1326    BILIRUBINUR neg 12/03/2014 1507   KETONESUR 20 (A) 02/14/2017 1326   PROTEINUR 100 (A) 02/14/2017 1326   UROBILINOGEN 2.0 12/03/2014 1507   UROBILINOGEN 1.0 01/07/2007 2119   NITRITE NEGATIVE 02/14/2017 1326   LEUKOCYTESUR NEGATIVE 02/14/2017 1326   Sepsis Labs: !!!!!!!!!!!!!!!!!!!!!!!!!!!!!!!!!!!!!!!!!!!! @LABRCNTIP (procalcitonin:4,lacticidven:4) )No results found for this or any previous visit (from the past 240 hour(s)).   Radiological Exams on Admission: No results found.  EKG: Independently reviewed. Sinus tachycardia with RBBB. Nonspecific T changes.   Assessment/Plan Principal Problem:   Hypokalemia Active Problems:   Essential hypertension   Seizures (HCC)   Chronic low back pain   Hypomagnesemia   HLD (hyperlipidemia)  Hypokalemia -Unclear etiology, he has not had any GI losses, not on any diuretic therapies at baseline.  He does not have metabolic acidosis. -He does have history of hypertension since age 89 -Check aldosterone, renin levels -Check urine potassium -He was given 40 mEq of Klor-Con as well as 20 mEq IV potassium in the emergency department.  Repeat labs this evening as he may require additional supplementation -Repeat EKG after potassium replaced  Hypomagnesemia -Replace with IV in the emergency department  -Repeat labs in the morning  Essential hypertension -Continue Norvasc, he was also started on lisinopril 1/15 by primary care physician   Hyperlipidemia -Continue Lipitor  Chronic low back pain -Continue home Robaxin, Neurontin  Seizure disorder -Continue Keppra  Leg weakness -PT OT   DVT prophylaxis: lovenox Code Status: full  Family Communication: no family at bedside Disposition Plan: pending labs in AM  Consults called: none  Admission status: observation   Severity of Illness: The appropriate patient status for this patient is OBSERVATION. Observation status is judged to be reasonable and necessary in order to provide the  required intensity of service to ensure the patient's safety. The patient's presenting symptoms, physical exam findings, and initial radiographic and laboratory data in the context of their medical condition is felt to place them at decreased risk for further clinical deterioration. Furthermore, it is anticipated that the patient will be medically stable for discharge from the hospital within 2 midnights of  admission. The following factors support the patient status of observation.   " The patient's presenting symptoms include weakness. " The physical exam findings are benign. " The initial radiographic and laboratory data are significant for hypokalemia and hypomagnesemia.    Noralee StainJennifer Laurin Morgenstern, DO [facility] [email or web address] Password [email or web address] 02/14/2017, 5:06 PM

## 2017-02-14 NOTE — ED Provider Notes (Signed)
[facility] Provider Note   CSN: [id number] Arrival date & time: 02/14/17  1219     History   Chief Complaint Chief Complaint  Patient presents with  . Abnormal Lab    HPI  Jared Reyes is a 62 y.o. Male with a history of hypertension, stroke, seizures, and substance abuse, who presents to the ED for evaluation of hypokalemia after being called by his PCP reporting a potassium of 2.4.  Patient has reported weakness particularly in his lower extremities for the past week but denies any other symptoms, denies chest pain, shortness of breath, abdominal pain, nausea, vomiting, diarrhea, hematochezia or melena.  Patient reports 1 previous episode of hypokalemia a year or 2 ago which was resolved with oral potassium.  Patient is not currently on a fluid pill, blood pressure medications are amlodipine, and lisinopril.      Past Medical History:  Diagnosis Date  . Anxiety   . Arthritis   . Asthma   . Cellulitis 09/20/2015   right elbow  . Chronic low back pain 10/22/2014  . Depression 01/14/2007   Qualifier: Diagnosis of  By: Clent RidgesFry MD, Tera MaterStephen A   . Esophagitis   . Essential hypertension 10/25/2006   Qualifier: Diagnosis of  By: Linna DarnerWyrick, CMA, Cindy    . 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 Hernia, inguinal    right  . History of gastric ulcer 12/31/2014  . History of stroke 03/11/2015  . Poor dentition    multiple broken teeth (only few not broken)  . Seizures (HCC) 01/08/2007   alcohol withdrawal, reports last seizure 2016  . Stroke (HCC)   . Substance abuse [facility](HCC)    alcoholism    Patient Active Problem List   Diagnosis Date Noted  . 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 Cigarette smoker 10/15/2016  . Smokers' cough (HCC) 07/14/2016  . History of bilateral cataract extraction 07/26/2015  . Allergic rhinoconjunctivitis of left eye 04/26/2015  . History of stroke  03/11/2015  . History of gastric ulcer 12/31/2014  . Abnormal urinalysis 12/03/2014  . Chronic low back pain 10/22/2014  . Inguinal hernia 09/30/2014  . Insomnia 09/25/2013  . Seizures (HCC) 07/11/2007  . Depression 01/14/2007  . Chronic anxiety 10/25/2006  . Alcohol use (HCC) 10/25/2006  . Essential hypertension 10/25/2006  . ESOPHAGITIS NOS 10/25/2006    Past Surgical History:  Procedure Laterality Date  . ABDOMINAL SURGERY    . EYE SURGERY     cataracts bilateral 2008  . FRACTURE SURGERY  1978   surgery on 2,3,4 digits left hand  . HAND SURGERY     left hand  . HERNIA REPAIR Right    as a child  . INGUINAL HERNIA REPAIR Right 12/12/2016   Procedure: HERNIA REPAIR INGUINAL ADULT recurrent ;  Surgeon: Ancil Linseyavis, Jason Evan, MD;  Location: [facility] ORS;  Service: General;  Laterality: Right;  . TONSILLECTOMY         Home Medications    Prior to Admission medications   Medication Sig Start Date End Date Taking? Authorizing Provider  albuterol (VENTOLIN HFA) 108 (90 Base) MCG/ACT inhaler Inhale 2 puffs into the lungs every 6 (six) hours as needed for wheezing or shortness of breath. 08/08/16   Funches, Gerilyn NestleJosalyn, MD  amLODipine (NORVASC) 10 MG tablet Take 1 tablet (10 mg total) by mouth daily. 02/12/17   Jaclyn ShaggyAmao, Enobong, MD  atorvastatin (LIPITOR) 40 MG tablet Take 1 tablet (  40 mg total) by mouth daily. 02/12/17   Jaclyn Shaggy, MD  docusate sodium (COLACE) 100 MG capsule Take 1 capsule (100 mg total) by mouth daily. 02/07/16   Funches, Gerilyn Nestle, MD  gabapentin (NEURONTIN) 300 MG capsule Take 1 capsule (300 mg total) by mouth 2 (two) times daily. 02/12/17   Jaclyn Shaggy, MD  Glucosamine HCl (GLUCOSAMINE PO) Take 1 tablet by mouth 2 (two) times a week.    [provider]  levETIRAcetam (KEPPRA XR) 500 MG 24 hr tablet Take 2 tablets (1,000 mg total) by mouth daily. 02/12/17   Jaclyn Shaggy, MD  lisinopril (PRINIVIL,ZESTRIL) 5 MG tablet Take 1 tablet (5 mg total) by mouth daily. 02/12/17    Jaclyn Shaggy, MD  methocarbamol (ROBAXIN-750) 750 MG tablet Take 1 tablet (750 mg total) by mouth 2 (two) times daily as needed for muscle spasms. 02/12/17   Jaclyn Shaggy, MD  oxyCODONE-acetaminophen (PERCOCET/ROXICET) 5-325 MG tablet Take 1-2 tablets every 4 (four) hours as needed by mouth for severe pain. Patient not taking: Reported on 02/12/2017 12/13/16   Ancil Linsey, MD  potassium chloride SA (K-DUR,KLOR-CON) 20 MEQ tablet Take 1 tablet (20 mEq total) by mouth 2 (two) times daily. 02/14/17   Jaclyn Shaggy, MD  traZODone (DESYREL) 50 MG tablet Take 1.5 tablets (75 mg total) by mouth at bedtime. 07/12/16   Funches, Gerilyn Nestle, MD  Turmeric 500 MG CAPS Take 500 mg by mouth daily. 01/12/16   Dessa Phi, MD    Family History Family History  Problem Relation Age of Onset  . Hypertension Mother   . Diabetes Mother   . Hypertension Father   . Alcohol abuse Unknown        fhx  . Diabetes Unknown        fhx  . Hypertension Unknown        fhx  . Hypertension Sister   . Hypertension Brother     Social History Social History   Tobacco Use  . Smoking status: Current Every Day Smoker    Packs/day: 0.00    Types: Cigarettes  . Smokeless tobacco: Never Used  Substance Use Topics  . Alcohol use: Yes    Alcohol/week: 25.2 oz    Types: 42 Cans of beer per week    Comment: Last beer drank 12/03/16  per patient.  . Drug use: No     Allergies   Nsaids   Review of Systems Review of Systems  Constitutional: Negative for chills and fatigue.  HENT: Negative.   Eyes: Negative for visual disturbance.  Respiratory: Negative for chest tightness and shortness of breath.   Cardiovascular: Negative for chest pain.  Gastrointestinal: Negative for abdominal pain, blood in stool, constipation, diarrhea, nausea and vomiting.  Genitourinary: Negative for dysuria.  Musculoskeletal: Positive for back pain (Chronic). Negative for arthralgias and myalgias.  Skin: Negative for color change  and rash.  Neurological: Positive for weakness. Negative for dizziness, light-headedness and numbness.     Physical Exam Updated Vital Signs BP (!) 113/94   Pulse 99   Temp 98.9 F (37.2 C) (Oral)   Resp (!) 23   Ht 5\' 8"  (1.727 m)   Wt 66.2 kg (146 lb)   SpO2 100%   BMI 22.20 kg/m   Physical Exam  Constitutional: He appears well-developed and well-nourished. No distress.  HENT:  Head: Normocephalic and atraumatic.  Eyes: Right eye exhibits no discharge. Left eye exhibits no discharge.  Neck: Neck supple.  Cardiovascular: Normal rate, regular rhythm, normal heart  sounds and intact distal pulses.  Pulmonary/Chest: Effort normal and breath sounds normal. No stridor. No respiratory distress. He has no wheezes. He has no rales.  Abdominal: Soft. Bowel sounds are normal. He exhibits no distension and no mass. There is no tenderness. There is no guarding.  Musculoskeletal: He exhibits no edema or deformity.  Neurological: He is alert. Coordination normal.  Speech is clear, able to follow commands CN III-XII intact Normal strength in upper and lower extremities bilaterally including dorsiflexion and plantar flexion, strong and equal grip strength Sensation normal to light and sharp touch Moves extremities without ataxia, coordination intact  Skin: Skin is warm and dry. He is not diaphoretic.  Psychiatric: He has a normal mood and affect. His behavior is normal.  Nursing note and vitals reviewed.    ED Treatments / Results  Labs (all labs ordered are listed, but only abnormal results are displayed) Labs Reviewed  BASIC METABOLIC PANEL - Abnormal; Notable for the following components:      Result Value   Sodium 131 (*)    Potassium 2.5 (*)    Chloride 87 (*)    Glucose, Bld 117 (*)    Calcium 8.6 (*)    Anion gap 17 (*)    All other components within normal limits  CBC - Abnormal; Notable for the following components:   RBC 3.98 (*)    HCT 36.8 (*)    MCHC 36.4 (*)     All other components within normal limits  URINALYSIS, ROUTINE W REFLEX MICROSCOPIC - Abnormal; Notable for the following components:   Color, Urine AMBER (*)    Hgb urine dipstick LARGE (*)    Ketones, ur 20 (*)    Protein, ur 100 (*)    Bacteria, UA RARE (*)    All other components within normal limits  MAGNESIUM - Abnormal; Notable for the following components:   Magnesium 1.3 (*)    All other components within normal limits  CBG MONITORING, ED    EKG  EKG Interpretation None       Radiology No results found.  Procedures .Critical Care Performed by: Dartha Lodge, PA-C Authorized by: Dartha Lodge, PA-C   Critical care provider statement:    Critical care time (minutes):  30   Critical care start time:  02/14/2017 3:11 PM   Critical care end time:  02/14/2017 3:41 PM   (including critical care time)  Medications Ordered in ED Medications  potassium chloride 10 mEq in 100 mL IVPB (0 mEq Intravenous Stopped 02/14/17 1633)  magnesium sulfate IVPB 2 g 50 mL (2 g Intravenous New Bag/Given 02/14/17 1633)  potassium chloride SA (K-DUR,KLOR-CON) CR tablet 40 mEq (40 mEq Oral Given 02/14/17 1535)     Initial Impression / Assessment and Plan / ED Course  I have reviewed the triage vital signs and the nursing notes.  Pertinent labs & imaging results that were available during my care of the patient were reviewed by me and considered in my medical decision making (see chart for details).  Patient presents for evaluation of hypokalemia, called by PCP regarding potassium of 2.4, patient reports some generalized lower extremity weakness but denies any other focal symptoms, no diarrhea, nausea, vomiting or abdominal pain.  Patient is not on any diuretics.  Denies chest pain or shortness of breath.  Patient is overall well-appearing with normal vitals.  No focal findings on exam, lungs clear to auscultation, no neurologic deficits.  Potassium here is 2.4, and magnesium is  1.3, no  leukocytosis, hemoglobin stable, urinalysis without evidence of infection, mild hyponatremia, kidney function is normal.  Will start potassium replacement with 2 rounds of IV potassium and 40 mEq of oral potassium, will also give 2 g of magnesium.  Patient will need to be admitted for further potassium replacement, will consult hospitalist.  4:35 PM Spoke of Dr. Alvino Chapel with [facility] who will see and admit the patient for obs and continued potassium replacement, will recheck labs in the morning  Final Clinical Impressions(s) / ED Diagnoses   Final diagnoses:  Hypokalemia  Hypomagnesemia  Weakness    ED Discharge Orders    None       Dartha Lodge, PA-C 02/14/17 1648    Dartha Lodge, PA-C 02/14/17 1649    Vanetta Mulders, MD 02/14/17 1826

## 2017-02-14 NOTE — Telephone Encounter (Signed)
[facility] called to inform clinic of 2.4 critical lab. Dr.Amao was informed of phone call, but provider was already aware of critical lab. Patient has been contacted by provider and team lead and was not successful..Marland Kitchen

## 2017-02-14 NOTE — ED Triage Notes (Signed)
Pt coming from home after PCP called and said his potassium was very low. His K+ was 2.4. Pt denies any chest pain. Pt states he has been weak X1 week.

## 2017-02-14 NOTE — ED Notes (Signed)
Pt states that his blood pressure has been running high and that the doctor just ordered another med.

## 2017-02-15 LAB — CBC
HCT: 31.6 % — ABNORMAL LOW (ref 39.0–52.0)
Hemoglobin: 11.3 g/dL — ABNORMAL LOW (ref 13.0–17.0)
MCH: 33.3 pg (ref 26.0–34.0)
MCHC: 35.8 g/dL (ref 30.0–36.0)
MCV: 93.2 fL (ref 78.0–100.0)
PLATELETS: 194 10*3/uL (ref 150–400)
RBC: 3.39 MIL/uL — AB (ref 4.22–5.81)
RDW: 13.6 % (ref 11.5–15.5)
WBC: 4.9 10*3/uL (ref 4.0–10.5)

## 2017-02-15 LAB — BASIC METABOLIC PANEL
ANION GAP: 11 (ref 5–15)
BUN: 7 mg/dL (ref 6–20)
CHLORIDE: 96 mmol/L — AB (ref 101–111)
CO2: 26 mmol/L (ref 22–32)
Calcium: 7.6 mg/dL — ABNORMAL LOW (ref 8.9–10.3)
Creatinine, Ser: 0.66 mg/dL (ref 0.61–1.24)
Glucose, Bld: 103 mg/dL — ABNORMAL HIGH (ref 65–99)
POTASSIUM: 2.5 mmol/L — AB (ref 3.5–5.1)
SODIUM: 133 mmol/L — AB (ref 135–145)

## 2017-02-15 LAB — HIV ANTIBODY (ROUTINE TESTING W REFLEX): HIV SCREEN 4TH GENERATION: NONREACTIVE

## 2017-02-15 LAB — MAGNESIUM: MAGNESIUM: 1.7 mg/dL (ref 1.7–2.4)

## 2017-02-15 LAB — POTASSIUM: POTASSIUM: 3.2 mmol/L — AB (ref 3.5–5.1)

## 2017-02-15 MED ORDER — POTASSIUM CHLORIDE CRYS ER 20 MEQ PO TBCR
40.0000 meq | EXTENDED_RELEASE_TABLET | ORAL | Status: AC
  Administered 2017-02-15 (×2): 40 meq via ORAL
  Filled 2017-02-15 (×2): qty 2

## 2017-02-15 MED ORDER — POTASSIUM CHLORIDE CRYS ER 20 MEQ PO TBCR
40.0000 meq | EXTENDED_RELEASE_TABLET | Freq: Once | ORAL | Status: AC
  Administered 2017-02-15: 40 meq via ORAL
  Filled 2017-02-15: qty 2

## 2017-02-15 MED ORDER — POTASSIUM CHLORIDE 10 MEQ/100ML IV SOLN
10.0000 meq | INTRAVENOUS | Status: AC
  Administered 2017-02-15 (×4): 10 meq via INTRAVENOUS
  Filled 2017-02-15 (×4): qty 100

## 2017-02-15 MED ORDER — POTASSIUM CHLORIDE CRYS ER 20 MEQ PO TBCR
40.0000 meq | EXTENDED_RELEASE_TABLET | Freq: Once | ORAL | Status: AC
  Administered 2017-02-15: 40 meq via ORAL

## 2017-02-15 MED ORDER — POTASSIUM CHLORIDE 10 MEQ/100ML IV SOLN
10.0000 meq | INTRAVENOUS | Status: AC
  Administered 2017-02-15 (×4): 10 meq via INTRAVENOUS
  Filled 2017-02-15 (×3): qty 100

## 2017-02-15 NOTE — ED Notes (Signed)
Attempted to call report

## 2017-02-15 NOTE — Progress Notes (Signed)
PROGRESS NOTE    Jared Reyes  VHQ:[id number] DOB: 1955/09/25 DOA: 02/14/2017 PCP: Jaclyn Shaggy, MD     Brief Narrative:  Jared Reyes is a 62 y.o. male with medical history significant of hypertension, chronic low back pain, recent inguinal hernia repair, seizure disorder, hyperlipidemia who presents after having a routine blood work completed at primary care physician's office which revealed hypokalemia.  He was advised to be evaluated in the emergency department.  He has no acute complaints aside from bilateral lower extremity leg weakness.  He states that since his inguinal surgery, he has been not as active as his normal.  About a week ago, he has noted that his legs are much weaker and has been using a walker to ambulate.  He denies any fevers, chills, chest pain, shortness of breath, nausea, vomiting, abdominal pain, diarrhea, dysuria. Labs obtained which revealed hypokalemia with potassium 2.5, hypomagnesemia 1.3.  He was given IV magnesium, IV potassium and oral potassium replacement.   Assessment & Plan:   Principal Problem:   Hypokalemia Active Problems:   Essential hypertension   Seizures (HCC)   Chronic low back pain   Hypomagnesemia   HLD (hyperlipidemia)   Hypokalemia -Unclear etiology, he has not had any GI losses, not on any diuretic therapies at baseline.  He does not have metabolic acidosis. Urine potassium is 7 which indicates that patient has extrarenal loss.  Again, he has any diarrhea and has been having one normal bowel movement daily.  He does admit that he only eats one meal per day and not eat as much as he should. -He does have history of hypertension since age 63 -Aldosterone, renin levels pending -So far patient has received 10 x 10 mEq IV potassium and 3 x 40 mEq Klor-Con.  Despite aggressive replacement, potassium level has only gone up to 0.7.  We will continue to replace this afternoon, repeat labs in the morning.  Hypomagnesemia -Replaced,  trend  Essential hypertension -Blood pressure is on the soft side, we will hold Norvasc and lisinopril   Hyperlipidemia -Continue Lipitor  Chronic low back pain -Continue home Robaxin, Neurontin  Seizure disorder -Continue Keppra  Leg weakness -PT OT  recommending home health   DVT prophylaxis: Lovenox Code Status: Full Family Communication: No family at bedside Disposition Plan: Pending improvement in labs   Consultants:   None  Procedures:   None  Antimicrobials:  Anti-infectives (From admission, onward)   None        Subjective: Patient feeling well, has no complaints today.  He states that his weakness is better, walked without a walker today with physical therapy and did well.  He denies any chest pain or shortness of breath, no nausea, vomiting, diarrhea, abdominal pain.  Objective: Vitals:   02/15/17 1030 02/15/17 1242 02/15/17 1300 02/15/17 1330  BP: 100/74 114/82 106/74 113/87  Pulse: 87 91 83 83  Resp: (!) 21 19 (!) 21 (!) 22  Temp:      TempSrc:      SpO2: 97% 99% 98% 99%  Weight:      Height:        Intake/Output Summary (Last 24 hours) at 02/15/2017 1402 Last data filed at 02/15/2017 1250 Gross per 24 hour  Intake 2550 ml  Output 650 ml  Net 1900 ml   Filed Weights   02/14/17 1306  Weight: 66.2 kg (146 lb)    Examination:  General exam: Appears calm and comfortable  Respiratory system: Clear to auscultation. Respiratory  effort normal. Cardiovascular system: S1 & S2 heard, RRR. No JVD, murmurs, rubs, gallops or clicks. No pedal edema. Gastrointestinal system: Abdomen is nondistended, soft and nontender. No organomegaly or masses felt. Normal bowel sounds heard. Central nervous system: Alert and oriented. No focal neurological deficits. Extremities: Symmetric 5 x 5 power. Skin: No rashes, lesions or ulcers Psychiatry: Judgement and insight appear normal. Mood & affect appropriate.   Data Reviewed: I have personally reviewed  following labs and imaging studies  CBC: Recent Labs  Lab 02/14/17 1333 02/14/17 1937 02/15/17 0327  WBC 7.4 5.7 4.9  HGB 13.4 12.7* 11.3*  HCT 36.8* 34.4* 31.6*  MCV 92.5 92.5 93.2  PLT 214 185 194   Basic Metabolic Panel: Recent Labs  Lab 02/12/17 1539 02/14/17 1333 02/14/17 1937 02/15/17 0327 02/15/17 1248  NA 130* 131*  --  133*  --   K 2.4* 2.5* 2.4* 2.5* 3.2*  CL 80* 87*  --  96*  --   CO2 23 27  --  26  --   GLUCOSE 106* 117*  --  103*  --   BUN 14 11  --  7  --   CREATININE 0.85 0.85 0.79 0.66  --   CALCIUM 9.4 8.6*  --  7.6*  --   MG  --  1.3*  --  1.7  --    GFR: Estimated Creatinine Clearance: 90.8 mL/min (by C-G formula based on SCr of 0.66 mg/dL). Liver Function Tests: Recent Labs  Lab 02/12/17 1539  AST 436*  ALT 130*  ALKPHOS 62  BILITOT 1.2  PROT 7.0  ALBUMIN 4.5   No results for input(s): LIPASE, AMYLASE in the last 168 hours. No results for input(s): AMMONIA in the last 168 hours. Coagulation Profile: No results for input(s): INR, PROTIME in the last 168 hours. Cardiac Enzymes: No results for input(s): CKTOTAL, CKMB, CKMBINDEX, TROPONINI in the last 168 hours. BNP (last 3 results) No results for input(s): PROBNP in the last 8760 hours. HbA1C: No results for input(s): HGBA1C in the last 72 hours. CBG: No results for input(s): GLUCAP in the last 168 hours. Lipid Profile: No results for input(s): CHOL, HDL, LDLCALC, TRIG, CHOLHDL, LDLDIRECT in the last 72 hours. Thyroid Function Tests: No results for input(s): TSH, T4TOTAL, FREET4, T3FREE, THYROIDAB in the last 72 hours. Anemia Panel: No results for input(s): VITAMINB12, FOLATE, FERRITIN, TIBC, IRON, RETICCTPCT in the last 72 hours. Sepsis Labs: No results for input(s): PROCALCITON, LATICACIDVEN in the last 168 hours.  No results found for this or any previous visit (from the past 240 hour(s)).     Radiology Studies: No results found.    Scheduled Meds: . atorvastatin  40 mg  Oral q1800  . docusate sodium  100 mg Oral Daily  . enoxaparin (LOVENOX) injection  40 mg Subcutaneous Q24H  . gabapentin  300 mg Oral BID  . levETIRAcetam  1,000 mg Oral Daily  . lisinopril  5 mg Oral Daily  . potassium chloride  40 mEq Oral Q4H  . sodium chloride flush  3 mL Intravenous Q12H  . traZODone  75 mg Oral QHS   Continuous Infusions:   LOS: 0 days    Time spent: 30 minutes   Noralee StainJennifer Tanny Harnack, DO [facility] [email or web address] Password [email or web address] 02/15/2017, 2:02 PM

## 2017-02-15 NOTE — ED Notes (Signed)
Final pot run started 10 meq

## 2017-02-15 NOTE — ED Notes (Signed)
Heart healthy diet breakfast ordered 

## 2017-02-15 NOTE — ED Notes (Signed)
Up to the br 

## 2017-02-15 NOTE — ED Notes (Signed)
The patient has not complaints and states he feels fine.

## 2017-02-15 NOTE — ED Notes (Signed)
Lunch tray ordered with service response.

## 2017-02-15 NOTE — ED Notes (Signed)
Lunch tray provided. 

## 2017-02-15 NOTE — ED Notes (Addendum)
Date and time results received: 02/15/17 5:31 AM  (use smartphrase ".now" to insert current time)  Test: Potassium Critical Value: 2.5  Name of Provider Notified: chris primary rn and pa bodenheimer via text page  Orders Received? Or Actions Taken?: awaiting response

## 2017-02-15 NOTE — Evaluation (Signed)
Physical Therapy Evaluation Patient Details Name: Jared Reyes MRN: [id number] DOB: 1955/08/20 Today's Date: 02/15/2017   History of Present Illness  Pt is a 62 y/o male admitted secondary to hypokalemia. Pt also complained of BLE for the past week. PMH inlcudes HTN, cronic LBP, seizures, CVA, and substance abuse.   Clinical Impression  Pt presenting with problem above and deficits below. Pt tolerated ambulation well, however, slightly unsteady requiring min guard A with mobility throughout. Reports living alone, however, neighbors check on him every day. Would benefit from acute PT to maximize functional mobility independence and safety.     Follow Up Recommendations Home health PT;Supervision - Intermittent    Equipment Recommendations  None recommended by PT    Recommendations for Other Services       Precautions / Restrictions Precautions Precautions: Fall Restrictions Weight Bearing Restrictions: No      Mobility  Bed Mobility Overal bed mobility: Modified Independent             General bed mobility comments: Increased time, however, no assist required.   Transfers Overall transfer level: Needs assistance Equipment used: None Transfers: Sit to/from Stand Sit to Stand: Min guard         General transfer comment: Min guard for steadying assist.   Ambulation/Gait Ambulation/Gait assistance: Min guard Ambulation Distance (Feet): 150 Feet Assistive device: (IV pole ) Gait Pattern/deviations: Step-through pattern;Trunk flexed;Shuffle;Decreased stride length Gait velocity: Decreased  Gait velocity interpretation: Below normal speed for age/gender General Gait Details: Slow, slightly unsteady gait, however, no overt LOB noted. Required verbal cues for appropriate step height as pt shuffled BLE. Min guard for steadying assist. Educated to use RW at home to increase safety.   Stairs            Wheelchair Mobility    Modified Rankin (Stroke Patients  Only)       Balance Overall balance assessment: Needs assistance Sitting-balance support: No upper extremity supported;Feet supported Sitting balance-Leahy Scale: Good     Standing balance support: Single extremity supported;No upper extremity supported;During functional activity Standing balance-Leahy Scale: Fair Standing balance comment: Able to maintain static standing without UE support.                              Pertinent Vitals/Pain Pain Assessment: No/denies pain    Home Living Family/patient expects to be discharged to:: Private residence Living Arrangements: Alone Available Help at Discharge: Neighbor;Available PRN/intermittently Type of Home: House Home Access: Stairs to enter Entrance Stairs-Rails: Right Entrance Stairs-Number of Steps: 2 Home Layout: One level Home Equipment: Walker - 2 wheels;Cane - single point;Bedside commode;Shower seat Additional Comments: Pt reports neighbors check in on him every day.     Prior Function Level of Independence: Independent with assistive device(s)         Comments: Reports he uses RW at home      Hand Dominance   Dominant Hand: Right    Extremity/Trunk Assessment   Upper Extremity Assessment Upper Extremity Assessment: Defer to OT evaluation    Lower Extremity Assessment Lower Extremity Assessment: Generalized weakness    Cervical / Trunk Assessment Cervical / Trunk Assessment: Kyphotic  Communication   Communication: No difficulties  Cognition Arousal/Alertness: Awake/alert Behavior During Therapy: WFL for tasks assessed/performed Overall Cognitive Status: Within Functional Limits for tasks assessed  General Comments General comments (skin integrity, edema, etc.): Pt reports he prefers to go home and feels comfortable to go home. Educated about HHPT recommendations and pt agreeable.     Exercises     Assessment/Plan    PT  Assessment Patient needs continued PT services  PT Problem List Decreased strength;Decreased balance;Decreased mobility;Decreased knowledge of use of DME       PT Treatment Interventions DME instruction;Gait training;Stair training;Functional mobility training;Therapeutic activities;Therapeutic exercise;Balance training;Neuromuscular re-education;Patient/family education    PT Goals (Current goals can be found in the Care Plan section)  Acute Rehab PT Goals Patient Stated Goal: to go home  PT Goal Formulation: With patient Time For Goal Achievement: 03/01/17 Potential to Achieve Goals: Good    Frequency Min 3X/week   Barriers to discharge Decreased caregiver support Lives alone, but neighbors check on him every day.     Co-evaluation               AM-PAC PT "6 Clicks" Daily Activity  Outcome Measure Difficulty turning over in bed (including adjusting bedclothes, sheets and blankets)?: A Little Difficulty moving from lying on back to sitting on the side of the bed? : A Little Difficulty sitting down on and standing up from a chair with arms (e.g., wheelchair, bedside commode, etc,.)?: Unable Help needed moving to and from a bed to chair (including a wheelchair)?: A Little Help needed walking in hospital room?: A Little Help needed climbing 3-5 steps with a railing? : A Little 6 Click Score: 16    End of Session Equipment Utilized During Treatment: Gait belt Activity Tolerance: Patient tolerated treatment well Patient left: in bed;with call bell/phone within reach Nurse Communication: Mobility status PT Visit Diagnosis: Unsteadiness on feet (R26.81);Muscle weakness (generalized) (M62.81)    Time: 1050-1110 PT Time Calculation (min) (ACUTE ONLY): 20 min   Charges:   PT Evaluation $PT Eval Low Complexity: 1 Low     PT G Codes:        Gladys DammeBrittany Emerald Shor, PT, DPT  Acute Rehabilitation Services  Pager: [phone]   Lehman PromBrittany S Jamie Belger 02/15/2017, 12:22 PM

## 2017-02-15 NOTE — ED Notes (Signed)
Pt eating his breakfast tray.

## 2017-02-15 NOTE — Progress Notes (Signed)
Jared Reyes is a 62 y.o. male patient admitted from ED awake, alert - oriented  X 4 - no acute distress noted.  VSS - Blood pressure 108/77, pulse 98, temperature 98.2 F (36.8 C), temperature source Oral, resp. rate 18, height 5\' 8"  (1.727 m), weight 66.2 kg (146 lb), SpO2 100 %.    IV in place, occlusive dsg intact without redness.  Orientation to room, and floor completed with information packet given to patient/family.  Patient declined safety video at this time.  Admission INP armband ID verified with patient/family, and in place.   SR up x 2, fall assessment complete, with patient and family able to verbalize understanding of risk associated with falls, and verbalized understanding to call nsg before up out of bed.  Call light within reach, patient able to voice, and demonstrate understanding.  Skin, clean-dry- intact without evidence of bruising, or skin tears.   No evidence of skin break down noted on exam.     Will cont to eval and treat per MD orders.  Jared AnconaLaura M Jacinto Keil, RN 02/15/2017 4:14 PM

## 2017-02-15 NOTE — ED Notes (Signed)
Dr bodenheimer has called back orders to be placed for po and iv  meds for low potassium

## 2017-02-15 NOTE — ED Notes (Signed)
Pt placed on the cardiac monitor pot chloride drip started

## 2017-02-15 NOTE — ED Notes (Signed)
1st [pt infused 2nd run added to ov.  Pt snoring loudly

## 2017-02-15 NOTE — ED Notes (Addendum)
Admitting doctor called  About the critical potassium   He will put more orders in

## 2017-02-15 NOTE — Progress Notes (Signed)
OT Cancellation Note and Discharge  Patient Details Name: Jared Reyes MRN: [id number] DOB: 26-Aug-1955   Cancelled Treatment:    Reason Eval/Treat Not Completed: OT screened, no needs identified, will sign off. Pt min guard with PT earlier with ambulation. Pt reports that he can manage his own basic ADLs and is feeling stronger in his legs today (K+ is trending up today), does not feel like he needs OT.   Evette GeorgesLeonard, Adams Hinch Eva [phone] 02/15/2017, 2:00 PM

## 2017-02-15 NOTE — ED Notes (Signed)
Service response called in regards to pt lunch tray. States lunch tray should be up shortly.

## 2017-02-16 DIAGNOSIS — E876 Hypokalemia: Principal | ICD-10-CM

## 2017-02-16 LAB — BASIC METABOLIC PANEL
ANION GAP: 11 (ref 5–15)
BUN: 5 mg/dL — AB (ref 6–20)
CALCIUM: 7.9 mg/dL — AB (ref 8.9–10.3)
CO2: 24 mmol/L (ref 22–32)
Chloride: 100 mmol/L — ABNORMAL LOW (ref 101–111)
Creatinine, Ser: 0.76 mg/dL (ref 0.61–1.24)
GFR calc Af Amer: >60 mL/min (ref >60)
GLUCOSE: 106 mg/dL — AB (ref 65–99)
Potassium: 3.2 mmol/L — ABNORMAL LOW (ref 3.5–5.1)
SODIUM: 135 mmol/L (ref 135–145)

## 2017-02-16 LAB — ALDOSTERONE + RENIN ACTIVITY W/ RATIO
ALDO / PRA RATIO: 0.9 (ref 0.0–30.0)
ALDOSTERONE: 1.5 ng/dL (ref 0.0–30.0)
PRA LC/MS/MS: 1.708 ng/mL/hr (ref 0.167–5.380)

## 2017-02-16 LAB — MAGNESIUM
MAGNESIUM: 1.4 mg/dL — AB (ref 1.7–2.4)
Magnesium: 2 mg/dL (ref 1.7–2.4)

## 2017-02-16 LAB — POTASSIUM: Potassium: 3.2 mmol/L — ABNORMAL LOW (ref 3.5–5.1)

## 2017-02-16 MED ORDER — POTASSIUM CHLORIDE ER 20 MEQ PO TBCR: 20.0000 meq | EXTENDED_RELEASE_TABLET | Freq: Every day | ORAL | 0 refills | Status: AC

## 2017-02-16 MED ORDER — MAGNESIUM SULFATE 2 GM/50ML IV SOLN
2.0000 g | Freq: Once | INTRAVENOUS | Status: AC
  Administered 2017-02-16: 2 g via INTRAVENOUS
  Filled 2017-02-16: qty 50

## 2017-02-16 MED ORDER — POTASSIUM CHLORIDE CRYS ER 20 MEQ PO TBCR
40.0000 meq | EXTENDED_RELEASE_TABLET | ORAL | Status: AC
  Administered 2017-02-16 (×2): 40 meq via ORAL
  Filled 2017-02-16 (×2): qty 2

## 2017-02-16 NOTE — Progress Notes (Signed)
Nsg Discharge Note  Admit Date:  02/14/2017 Discharge date: 02/16/2017   Jared Reyes to be D/C'd Home per MD order.  AVS completed.  Copy for chart, and copy for patient signed, and dated. Patient/caregiver able to verbalize understanding.  Discharge Medication: Allergies as of 02/16/2017      Reactions   Nsaids Other (See Comments)   Intolerance, hx of gastritis and gastric ulcer      Medication List    STOP taking these medications   amLODipine 10 MG tablet Commonly known as:  NORVASC     TAKE these medications   albuterol 108 (90 Base) MCG/ACT inhaler Commonly known as:  VENTOLIN HFA Inhale 2 puffs into the lungs every 6 (six) hours as needed for wheezing or shortness of breath.   atorvastatin 40 MG tablet Commonly known as:  LIPITOR Take 1 tablet (40 mg total) by mouth daily.   docusate sodium 100 MG capsule Commonly known as:  COLACE Take 1 capsule (100 mg total) by mouth daily. What changed:    when to take this  reasons to take this   gabapentin 300 MG capsule Commonly known as:  NEURONTIN Take 1 capsule (300 mg total) by mouth 2 (two) times daily.   GLUCOSAMINE PO Take 1 tablet by mouth 2 (two) times a week.   levETIRAcetam 500 MG 24 hr tablet Commonly known as:  KEPPRA XR Take 2 tablets (1,000 mg total) by mouth daily.   lisinopril 5 MG tablet Commonly known as:  PRINIVIL,ZESTRIL Take 1 tablet (5 mg total) by mouth daily.   methocarbamol 750 MG tablet Commonly known as:  ROBAXIN-750 Take 1 tablet (750 mg total) by mouth 2 (two) times daily as needed for muscle spasms.   Potassium Chloride ER 20 MEQ Tbcr Take 20 mEq by mouth daily.   traZODone 50 MG tablet Commonly known as:  DESYREL Take 1.5 tablets (75 mg total) by mouth at bedtime.   Turmeric 500 MG Caps Take 500 mg by mouth daily.       Discharge Assessment: Vitals:   02/16/17 0608 02/16/17 1422  BP: 116/75 99/62  Pulse: 92 88  Resp: 18 18  Temp: 97.7 F (36.5 C) 98.8 F  (37.1 C)  SpO2: 98% 100%   Skin clean, dry and intact without evidence of skin break down, no evidence of skin tears noted. IV catheter discontinued intact. Site without signs and symptoms of complications - no redness or edema noted at insertion site, patient denies c/o pain - only slight tenderness at site.  Dressing with slight pressure applied.  D/c Instructions-Education: Discharge instructions given to patient/family with verbalized understanding. D/c education completed with patient/family including follow up instructions, medication list, d/c activities limitations if indicated, with other d/c instructions as indicated by MD - patient able to verbalize understanding, all questions fully answered. Patient instructed to return to ED, call 911, or call MD for any changes in condition.  Patient escorted via WC, and D/C home via private auto.  [name], RN 02/16/2017 4:52 PM

## 2017-02-16 NOTE — Plan of Care (Signed)
Progressing

## 2017-02-16 NOTE — Care Management Note (Addendum)
Case Management Note  Patient Details  Name: [name] MRN: [id number] Date of Birth: 09/18/1955  Subjective/Objective:  Pt presented for hypokalemia.  [facility] uninsured [facility]                  [facility]Plan[facility]  [facility].  [facility]    Expected Discharge Date:  02/16/17               Expected Discharge Plan:  Home w Home Health Services  In-House Referral:  NA  Discharge planning Services  CM Consult  Post Acute Care Choice:  Home Health Choice offered to:  NA([facility]  DME Arranged:  N/A DME Agency:  NA  HH Arranged:  PT HH Agency:  [facility]- pt refused  Status of Service:  Completed, signed off  If discussed at Long Length of Stay Meetings, dates discussed:    Additional Comments:  Verdene LennertGoldean, Evoleth Nordmeyer K, RN 02/16/2017, 4:49 PM

## 2017-02-16 NOTE — Discharge Summary (Addendum)
Physician Discharge Summary  Jared Reyes ZOX:[id number] DOB: Dec 17, 1955 DOA: 02/14/2017  PCP: Jaclyn Shaggy, MD  Admit date: 02/14/2017 Discharge date: 02/16/2017  Admitted From: Home Disposition:  Home   Recommendations for Outpatient Follow-up:  1. Follow up with PCP as scheduled on 1/22  2. Please obtain BMP and Mg on 1/22  3. Please follow up on the following pending results: renin aldosterone level   Home Health: PT   Equipment/Devices: None    Discharge Condition: Stable CODE STATUS: Full  Diet recommendation: Heart healthy   Brief/Interim Summary: Jared C Powellis a 61 y.o.malewith medical history significant ofhypertension, chronic low back pain, recent inguinal hernia repair, seizure disorder,hyperlipidemia who presents after having a routine blood work completed at primary care physician's office which revealed hypokalemia. He was advised to be evaluated in the emergency department. He has no acute complaints aside from bilateral lower extremity leg weakness. He states that since his inguinal surgery, he has been not as active as his normal. About a week ago, he has noted that his legs are much weaker and has been using a walker to ambulate. He denies any fevers, chills, chest pain, shortness of breath, nausea, vomiting, abdominal pain, diarrhea, dysuria. Labs obtained which revealed hypokalemia with potassium 2.5, hypomagnesemia 1.3. He was given IV magnesium, IV potassium and oral potassium replacement.   Discharge Diagnoses:  Principal Problem:   Hypokalemia Active Problems:   Essential hypertension   Seizures (HCC)   Chronic low back pain   Hypomagnesemia   HLD (hyperlipidemia)  Hypokalemia -Unclear etiology, he has not had any GI losses, not on any diuretic therapies at baseline.He does not have metabolic acidosis. Urine potassium is 7 which indicates that patient has extrarenal loss.  Again, he denies any diarrhea and has been having one normal bowel  movement daily.  He does admit that he only eats one meal per day and not eat as much as he should. -He does have history of hypertension since age 32 -Aldosterone, renin levels pending -Stable last 24 hours potassium level 3.2. Replaced further today. Will discharge home on klor con daily until follow up with PCP 1/22   Hypomagnesemia -Replaced, trend  Essential hypertension -Hold norvasc and continue lisinopril. SBP has ranged 90-120s during hospitalization.  Hyperlipidemia -Continue Lipitor  Chronic low back pain -Continue home Robaxin, Neurontin  Seizure disorder -Continue Keppra  Leg weakness -PT OT recommending home health    Discharge Instructions  Discharge Instructions    Call MD for:  difficulty breathing, headache or visual disturbances   Complete by:  As directed    Call MD for:  extreme fatigue   Complete by:  As directed    Call MD for:  hives   Complete by:  As directed    Call MD for:  persistant dizziness or light-headedness   Complete by:  As directed    Call MD for:  persistant nausea and vomiting   Complete by:  As directed    Call MD for:  severe uncontrolled pain   Complete by:  As directed    Call MD for:  temperature >100.4   Complete by:  As directed    Diet - low sodium heart healthy   Complete by:  As directed    Discharge instructions   Complete by:  As directed    You were cared for by a hospitalist during your hospital stay. If you have any questions about your discharge medications or the care you received while you  were in the hospital after you are discharged, you can call the unit and asked to speak with the hospitalist on call if the hospitalist that took care of you is not available. Once you are discharged, your primary care physician will handle any further medical issues. Please note that NO REFILLS for any discharge medications will be authorized once you are discharged, as it is imperative that you return to your  primary care physician (or establish a relationship with a primary care physician if you do not have one) for your aftercare needs so that they can reassess your need for medications and monitor your lab values.   Increase activity slowly   Complete by:  As directed      Allergies as of 02/16/2017      Reactions   Nsaids Other (See Comments)   Intolerance, hx of gastritis and gastric ulcer      Medication List    STOP taking these medications   amLODipine 10 MG tablet Commonly known as:  NORVASC     TAKE these medications   albuterol 108 (90 Base) MCG/ACT inhaler Commonly known as:  VENTOLIN HFA Inhale 2 puffs into the lungs every 6 (six) hours as needed for wheezing or shortness of breath.   atorvastatin 40 MG tablet Commonly known as:  LIPITOR Take 1 tablet (40 mg total) by mouth daily.   docusate sodium 100 MG capsule Commonly known as:  COLACE Take 1 capsule (100 mg total) by mouth daily. What changed:    when to take this  reasons to take this   gabapentin 300 MG capsule Commonly known as:  NEURONTIN Take 1 capsule (300 mg total) by mouth 2 (two) times daily.   GLUCOSAMINE PO Take 1 tablet by mouth 2 (two) times a week.   levETIRAcetam 500 MG 24 hr tablet Commonly known as:  KEPPRA XR Take 2 tablets (1,000 mg total) by mouth daily.   lisinopril 5 MG tablet Commonly known as:  PRINIVIL,ZESTRIL Take 1 tablet (5 mg total) by mouth daily.   methocarbamol 750 MG tablet Commonly known as:  ROBAXIN-750 Take 1 tablet (750 mg total) by mouth 2 (two) times daily as needed for muscle spasms.   Potassium Chloride ER 20 MEQ Tbcr Take 20 mEq by mouth daily.   traZODone 50 MG tablet Commonly known as:  DESYREL Take 1.5 tablets (75 mg total) by mouth at bedtime.   Turmeric 500 MG Caps Take 500 mg by mouth daily.      Follow-up Information    Jaclyn Shaggy, MD. Go on 02/19/2017.   Specialty:  Family Medicine Contact information: [address]  [place] [address] [phone]          Allergies  Allergen Reactions  . Nsaids Other (See Comments)    Intolerance, hx of gastritis and gastric ulcer    Consultations:  None   Procedures/Studies: No results found.     Discharge Exam: Vitals:   02/16/17 0608 02/16/17 1422  BP: 116/75 99/62  Pulse: 92 88  Resp: 18 18  Temp: 97.7 F (36.5 C) 98.8 F (37.1 C)  SpO2: 98% 100%   Vitals:   02/15/17 2124 02/16/17 0531 02/16/17 0608 02/16/17 1422  BP: 96/65 116/75 116/75 99/62  Pulse: 91 92 92 88  Resp: 18 20 18 18   Temp: 97.9 F (36.6 C) 97.7 F (36.5 C) 97.7 F (36.5 C) 98.8 F (37.1 C)  TempSrc: Oral Oral Oral Oral  SpO2: 99% 98% 98% 100%  Weight:      Height:        General: Pt is alert, awake, not in acute distress Cardiovascular: RRR, S1/S2 +, no rubs, no gallops Respiratory: CTA bilaterally, no wheezing, no rhonchi Abdominal: Soft, NT, ND, bowel sounds + Extremities: no edema, no cyanosis    The results of significant diagnostics from this hospitalization (including imaging, microbiology, ancillary and laboratory) are listed below for reference.     Microbiology: No results found for this or any previous visit (from the past 240 hour(s)).   Labs: BNP (last 3 results) No results for input(s): BNP in the last 8760 hours. Basic Metabolic Panel: Recent Labs  Lab 02/12/17 1539 02/14/17 1333 02/14/17 1937 02/15/17 0327 02/15/17 1248 02/16/17 0247 02/16/17 1451  NA 130* 131*  --  133*  --  135  --   K 2.4* 2.5* 2.4* 2.5* 3.2* 3.2* 3.2*  CL 80* 87*  --  96*  --  100*  --   CO2 23 27  --  26  --  24  --   GLUCOSE 106* 117*  --  103*  --  106*  --   BUN 14 11  --  7  --  5*  --   CREATININE 0.85 0.85 0.79 0.66  --  0.76  --   CALCIUM 9.4 8.6*  --  7.6*  --  7.9*  --   MG  --  1.3*  --  1.7  --  1.4* 2.0   Liver Function Tests: Recent Labs  Lab 02/12/17 1539  AST 436*  ALT 130*  ALKPHOS 62  BILITOT 1.2  PROT 7.0  ALBUMIN 4.5    No results for input(s): LIPASE, AMYLASE in the last 168 hours. No results for input(s): AMMONIA in the last 168 hours. CBC: Recent Labs  Lab 02/14/17 1333 02/14/17 1937 02/15/17 0327  WBC 7.4 5.7 4.9  HGB 13.4 12.7* 11.3*  HCT 36.8* 34.4* 31.6*  MCV 92.5 92.5 93.2  PLT 214 185 194   Cardiac Enzymes: No results for input(s): CKTOTAL, CKMB, CKMBINDEX, TROPONINI in the last 168 hours. BNP: Invalid input(s): POCBNP CBG: No results for input(s): GLUCAP in the last 168 hours. D-Dimer No results for input(s): DDIMER in the last 72 hours. Hgb A1c No results for input(s): HGBA1C in the last 72 hours. Lipid Profile No results for input(s): CHOL, HDL, LDLCALC, TRIG, CHOLHDL, LDLDIRECT in the last 72 hours. Thyroid function studies No results for input(s): TSH, T4TOTAL, T3FREE, THYROIDAB in the last 72 hours.  Invalid input(s): FREET3 Anemia work up No results for input(s): VITAMINB12, FOLATE, FERRITIN, TIBC, IRON, RETICCTPCT in the last 72 hours. Urinalysis    Component Value Date/Time   COLORURINE AMBER (A) 02/14/2017 1326   APPEARANCEUR CLEAR 02/14/2017 1326   LABSPEC 1.016 02/14/2017 1326   PHURINE 6.0 02/14/2017 1326   GLUCOSEU NEGATIVE 02/14/2017 1326   HGBUR LARGE (A) 02/14/2017 1326   BILIRUBINUR NEGATIVE 02/14/2017 1326   BILIRUBINUR neg 12/03/2014 1507   KETONESUR 20 (A) 02/14/2017 1326   PROTEINUR 100 (A) 02/14/2017 1326   UROBILINOGEN 2.0 12/03/2014 1507   UROBILINOGEN 1.0 01/07/2007 2119   NITRITE NEGATIVE 02/14/2017 1326   LEUKOCYTESUR NEGATIVE 02/14/2017 1326   Sepsis Labs Invalid input(s): PROCALCITONIN,  WBC,  LACTICIDVEN Microbiology No results found for this or any previous visit (from the past 240 hour(s)).   Time coordinating discharge: 40 minutes  SIGNED:  Noralee StainJennifer Othmar Ringer, DO [facility] Pager [phone]  If 7PM-7AM, please contact night-coverage [email or web address]  Password [email or web address] 02/16/2017, 4:20 PM

## 2017-02-19 ENCOUNTER — Ambulatory Visit: Payer: Self-pay | Attending: Family Medicine | Admitting: Family Medicine

## 2017-02-19 ENCOUNTER — Encounter: Payer: Self-pay | Admitting: Family Medicine

## 2017-02-19 VITALS — BP 116/82 | HR 110 | Temp 97.3°F | Ht 68.0 in | Wt 150.8 lb

## 2017-02-19 DIAGNOSIS — Z8719 Personal history of other diseases of the digestive system: Secondary | ICD-10-CM | POA: Insufficient documentation

## 2017-02-19 DIAGNOSIS — G8929 Other chronic pain: Secondary | ICD-10-CM | POA: Insufficient documentation

## 2017-02-19 DIAGNOSIS — R569 Unspecified convulsions: Secondary | ICD-10-CM | POA: Insufficient documentation

## 2017-02-19 DIAGNOSIS — I959 Hypotension, unspecified: Secondary | ICD-10-CM | POA: Insufficient documentation

## 2017-02-19 DIAGNOSIS — M545 Low back pain: Secondary | ICD-10-CM | POA: Insufficient documentation

## 2017-02-19 DIAGNOSIS — K4091 Unilateral inguinal hernia, without obstruction or gangrene, recurrent: Secondary | ICD-10-CM | POA: Insufficient documentation

## 2017-02-19 DIAGNOSIS — Z8673 Personal history of transient ischemic attack (TIA), and cerebral infarction without residual deficits: Secondary | ICD-10-CM | POA: Insufficient documentation

## 2017-02-19 DIAGNOSIS — E876 Hypokalemia: Secondary | ICD-10-CM | POA: Insufficient documentation

## 2017-02-19 DIAGNOSIS — J45909 Unspecified asthma, uncomplicated: Secondary | ICD-10-CM | POA: Insufficient documentation

## 2017-02-19 DIAGNOSIS — F419 Anxiety disorder, unspecified: Secondary | ICD-10-CM | POA: Insufficient documentation

## 2017-02-19 DIAGNOSIS — I10 Essential (primary) hypertension: Secondary | ICD-10-CM | POA: Insufficient documentation

## 2017-02-19 DIAGNOSIS — Z79899 Other long term (current) drug therapy: Secondary | ICD-10-CM | POA: Insufficient documentation

## 2017-02-19 NOTE — Progress Notes (Signed)
Subjective:  Patient ID: EL PILE, male    DOB: 11-24-1955  Age: 62 y.o. 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  He had presented for an office visit last week with muscle weakness necessitating the use of a walker and potasium levels returned at 2.4 and he was referred to the ED. He received IV potassium supplements as well as magnesium repletion and his amlodipine was held due to hypotension. Potassium improved to 3.2 after which he was discharged.  He reports doing well today and muscle weakness has resolved. He has been taking of potassium twice daily as he had previously received that from the clinic. Advised to take it daily. He has no other acute concerns.   Past Medical History:  Diagnosis Date  . Anxiety   . Arthritis   . Asthma   . Cellulitis 09/20/2015   right elbow  . Chronic low back pain 10/22/2014  . Depression 01/14/2007   Qualifier: Diagnosis of  By: Clent Ridges MD, Tera Mater   . Esophagitis   . Essential hypertension 10/25/2006   Qualifier: Diagnosis of  By: Linna Darner, CMA, Cindy    . 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 Hernia, inguinal    right  . History of gastric ulcer 12/31/2014  . History of stroke 03/11/2015  . Poor dentition    multiple broken teeth (only few not broken)  . Seizures (HCC) 01/08/2007   alcohol withdrawal, reports last seizure 2016  . Stroke (HCC)   . Substance abuse (HCC)    alcoholism    Past Surgical History:  Procedure Laterality Date  . ABDOMINAL SURGERY    . EYE SURGERY     cataracts bilateral 2008  . FRACTURE SURGERY  1978   surgery on 2,3,4 digits left hand  . HAND SURGERY     left hand  . HERNIA REPAIR  Right    as a child  . INGUINAL HERNIA REPAIR Right 12/12/2016   Procedure: HERNIA REPAIR INGUINAL ADULT recurrent ;  Surgeon: Ancil Linsey, MD;  Location: [facility] ORS;  Service: General;  Laterality: Right;  . TONSILLECTOMY      Allergies  Allergen Reactions  . Nsaids Other (See Comments)    Intolerance, hx of gastritis and gastric ulcer     Outpatient Medications Prior to Visit  Medication Sig Dispense Refill  . albuterol (VENTOLIN HFA) 108 (90 Base) MCG/ACT inhaler Inhale 2 puffs into the lungs every 6 (six) hours as needed for wheezing or shortness of breath. 54 g 3  . atorvastatin (LIPITOR) 40 MG tablet Take 1 tablet (40 mg total) by mouth daily. 30 tablet 3  . docusate sodium (COLACE) 100 MG capsule Take 1 capsule (100 mg total) by mouth daily. (Patient taking differently: Take 100 mg by mouth daily as needed for mild constipation. ) 30 capsule 5  . Glucosamine HCl (GLUCOSAMINE PO) Take 1 tablet by mouth 2 (two) times a week.    . levETIRAcetam (KEPPRA XR) 500 MG 24 hr tablet Take 2 tablets (1,000 mg total) by mouth daily. 60 tablet 3  . lisinopril (PRINIVIL,ZESTRIL) 5 MG tablet Take 1 tablet (5 mg total) by mouth daily. 30 tablet 3  . methocarbamol (ROBAXIN-750) 750  MG tablet Take 1 tablet (750 mg total) by mouth 2 (two) times daily as needed for muscle spasms. 60 tablet 3  . potassium chloride 20 MEQ TBCR Take 20 mEq by mouth daily. 10 tablet 0  . traZODone (DESYREL) 50 MG tablet Take 1.5 tablets (75 mg total) by mouth at bedtime. 45 tablet 5  . Turmeric 500 MG CAPS Take 500 mg by mouth daily.    Marland Kitchen. gabapentin (NEURONTIN) 300 MG capsule Take 1 capsule (300 mg total) by mouth 2 (two) times daily. (Patient not taking: Reported on 02/14/2017) 60 capsule 3   No facility-administered medications prior to visit.     ROS Review of Systems  Constitutional: Negative for activity change and appetite change.  HENT: Negative for sinus pressure and sore throat.   Eyes: Negative for  visual disturbance.  Respiratory: Negative for cough, chest tightness and shortness of breath.   Cardiovascular: Negative for chest pain and leg swelling.  Gastrointestinal: Negative for abdominal distention, abdominal pain, constipation and diarrhea.  Endocrine: Negative.   Genitourinary: Negative for dysuria.  Musculoskeletal: Negative for joint swelling and myalgias.  Skin: Negative for rash.  Allergic/Immunologic: Negative.   Neurological: Negative for weakness, light-headedness and numbness.  Psychiatric/Behavioral: Negative for dysphoric mood and suicidal ideas.    Objective:  BP 116/82   Pulse (!) 110   Temp (!) 97.3 F (36.3 C) (Oral)   Ht 5\' 8"  (1.727 m)   Wt 150 lb 12.8 oz (68.4 kg)   SpO2 99%   BMI 22.93 kg/m   BP/Weight 02/19/2017 02/16/2017 02/15/2017  Systolic BP 116 99 -  Diastolic BP 82 62 -  Wt. (Lbs) 150.8 - 146.61  BMI 22.93 - -  Some encounter information is confidential and restricted. Go to Review Flowsheets activity to see all data.      Physical Exam  Constitutional: He is oriented to person, place, and time. He appears well-developed and well-nourished.  Cardiovascular: Normal rate, normal heart sounds and intact distal pulses.  No murmur heard. Pulmonary/Chest: Effort normal and breath sounds normal. He has no wheezes. He has no rales. He exhibits no tenderness.  Abdominal: Soft. Bowel sounds are normal. He exhibits no distension and no mass. There is no tenderness.  Musculoskeletal: Normal range of motion.  Neurological: He is alert and oriented to person, place, and time.  Normal motor strength in all extremities  Skin: Skin is warm and dry.  Psychiatric: He has a normal mood and affect.      Assessment & Plan:   1. Hypokalemia Discharge potassium was 3.2 Continue 20meq of potassium daily - Basic Metabolic Panel  2. Hypomagnesemia Will check magnesium and replete as indicate - Magnesium  3. Essential  hypertension Controlled Counseled on blood pressure goal of less than 130/80, low-sodium, DASH diet, medication compliance, 150 minutes of moderate intensity exercise per week. Discussed medication compliance, adverse effects.    No orders of the defined types were placed in this encounter.   Follow-up: Return in about 3 months (around 05/20/2017) for Follow-up of chronic medical conditions.   Jaclyn ShaggyEnobong Amao MD

## 2017-02-19 NOTE — Patient Instructions (Signed)

## 2017-02-20 ENCOUNTER — Telehealth: Payer: Self-pay

## 2017-02-20 ENCOUNTER — Encounter: Payer: Self-pay | Admitting: Family Medicine

## 2017-02-20 LAB — BASIC METABOLIC PANEL
BUN/Creatinine Ratio: 8 — ABNORMAL LOW (ref 10–24)
BUN: 5 mg/dL — ABNORMAL LOW (ref 8–27)
CALCIUM: 8.7 mg/dL (ref 8.6–10.2)
CO2: 22 mmol/L (ref 20–29)
Chloride: 95 mmol/L — ABNORMAL LOW (ref 96–106)
Creatinine, Ser: 0.64 mg/dL — ABNORMAL LOW (ref 0.76–1.27)
GFR calc Af Amer: 122 mL/min/{1.73_m2} (ref >59)
GFR, EST NON AFRICAN AMERICAN: 106 mL/min/{1.73_m2} (ref >59)
Glucose: 91 mg/dL (ref 65–99)
Potassium: 4.3 mmol/L (ref 3.5–5.2)
Sodium: 132 mmol/L — ABNORMAL LOW (ref 134–144)

## 2017-02-20 LAB — MAGNESIUM: MAGNESIUM: 1.5 mg/dL — AB (ref 1.6–2.3)

## 2017-02-20 MED ORDER — MAGNESIUM OXIDE 400 MG PO CAPS: 400.0000 mg | ORAL_CAPSULE | Freq: Two times a day (BID) | ORAL | 1 refills | Status: AC

## 2017-02-20 MED FILL — MAGNESIUM OXIDE 400 MG TAB: 400 (240 MG | 30 days supply | Qty: 60 | Fill #0

## 2017-02-20 NOTE — Telephone Encounter (Signed)
Pt was called and there was no answer or VM set up to leave a message.

## 2017-02-22 ENCOUNTER — Telehealth: Payer: Self-pay

## 2017-02-22 NOTE — Telephone Encounter (Signed)
Patient was called and informed of lab results, and to continue taking medication.

## 2017-03-10 IMAGING — CT CT HEAD W/O CM
2 series · 15 of 30 positions shown, 19 images · non-contrast
Comparison: CT brain 01/07/2007; MRI brain 01/08/2007

CLINICAL DATA: Patient status post fall.  Seizure.

EXAM:
CT HEAD WITHOUT CONTRAST
TECHNIQUE: Contiguous axial images were obtained from the base of the skull
through the vertex without intravenous contrast.

[Series 2: head w/o · axial · non-contrast · 0.45mm/px · z∈[-84,+36]mm · 13 of 29 slices shown, 17 images]
[im 3/29  brain]
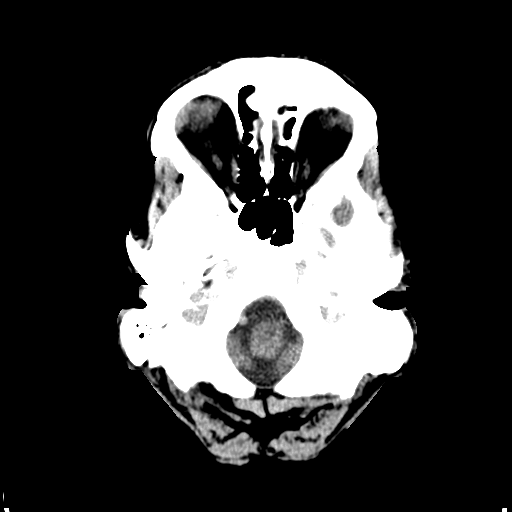
[im 3/29  bone]
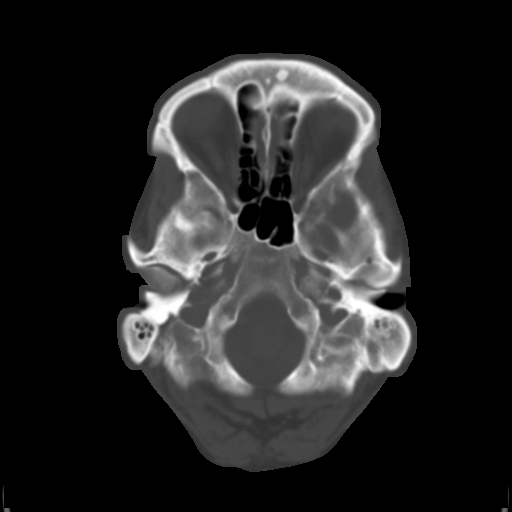
[im 5/29  brain]
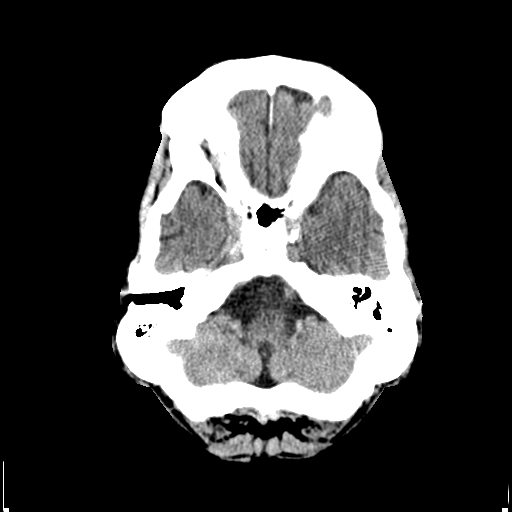
[im 7/29  brain]
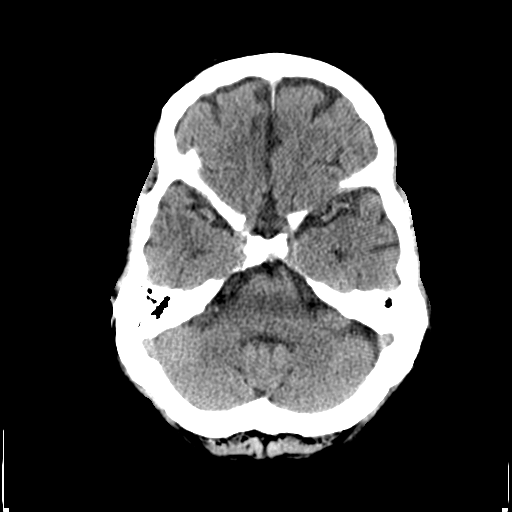
[im 9/29  brain]
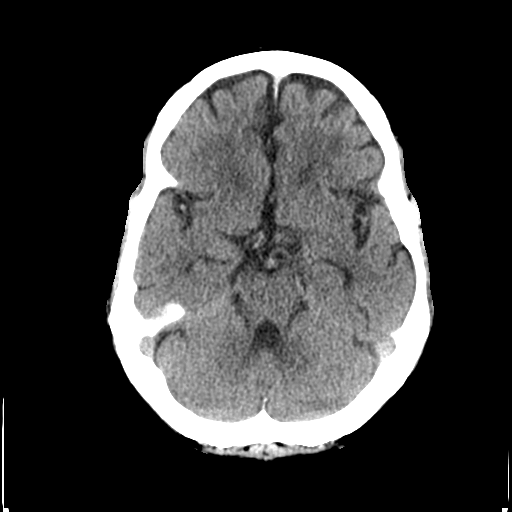
[im 11/29  brain]
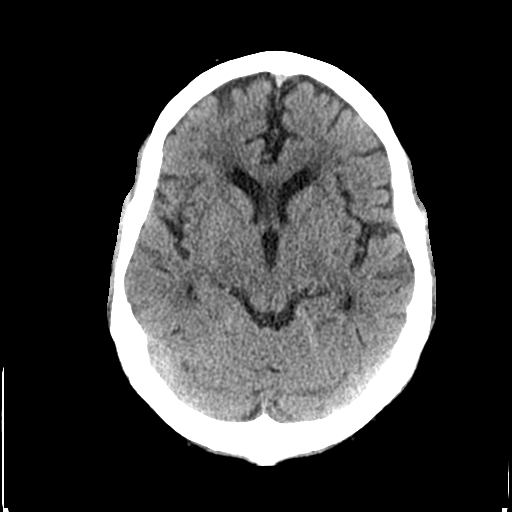
[im 11/29  bone]
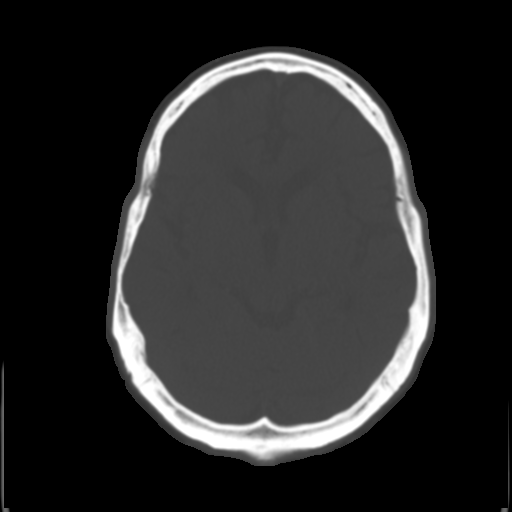
[im 13/29  brain]
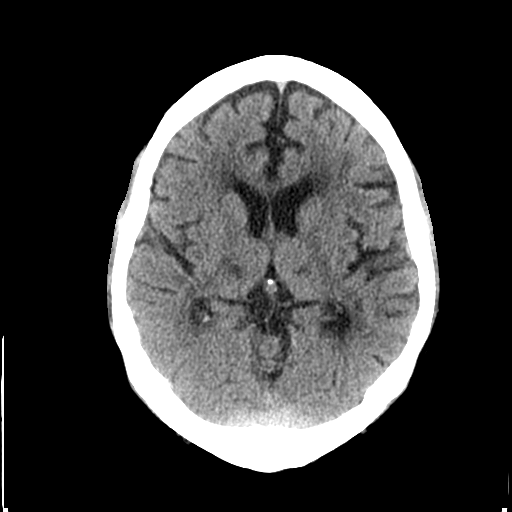
[im 15/29  brain]
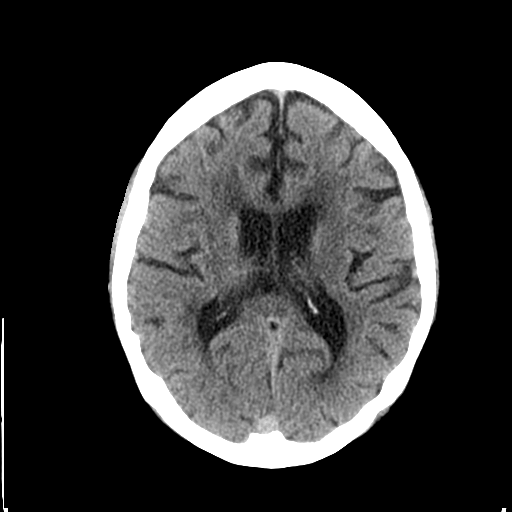
[im 17/29  brain]
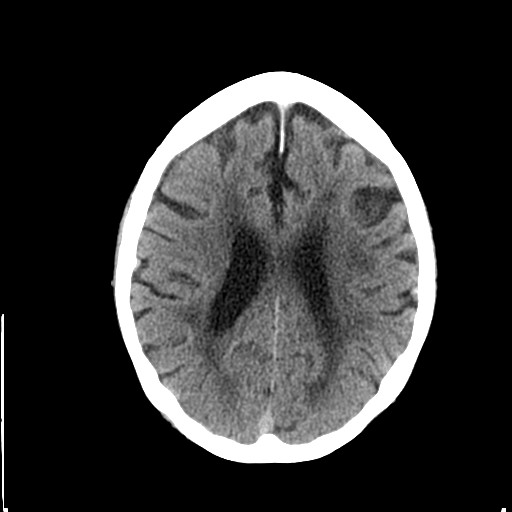
[im 19/29  brain]
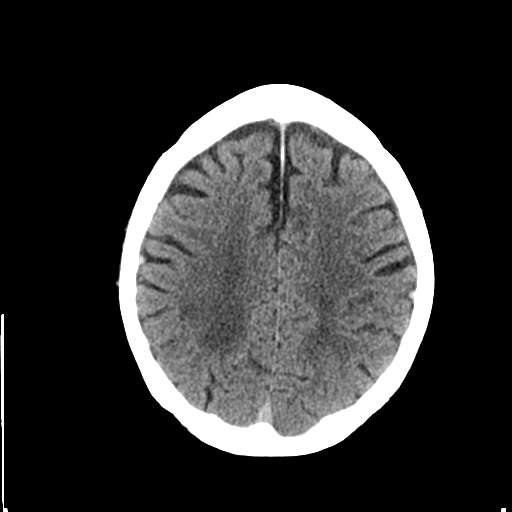
[im 19/29  bone]
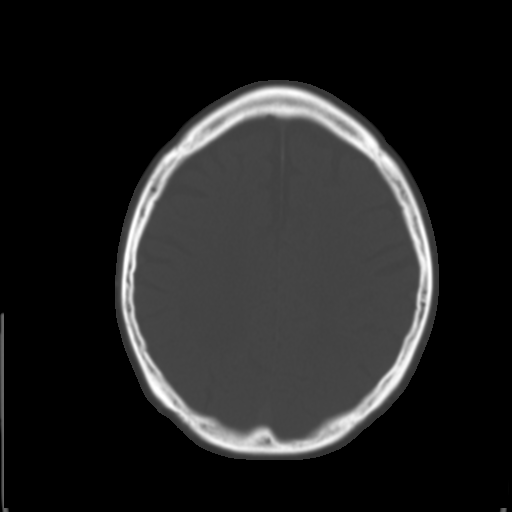
[im 21/29  brain]
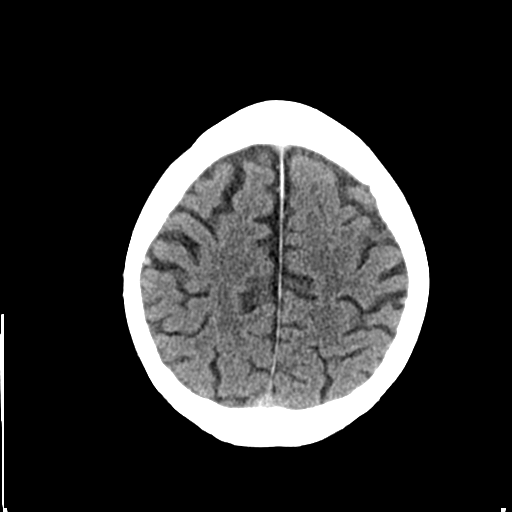
[im 23/29  brain]
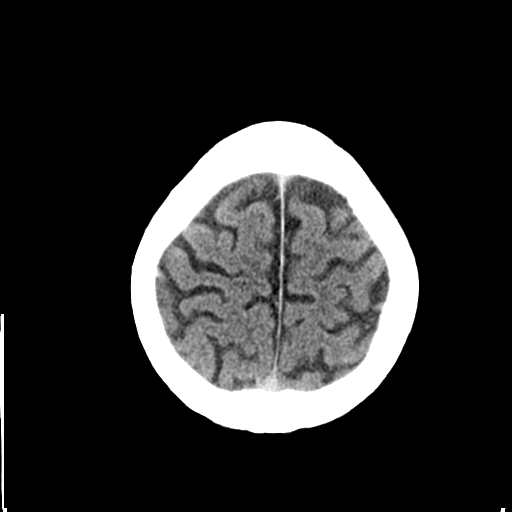
[im 25/29  brain]
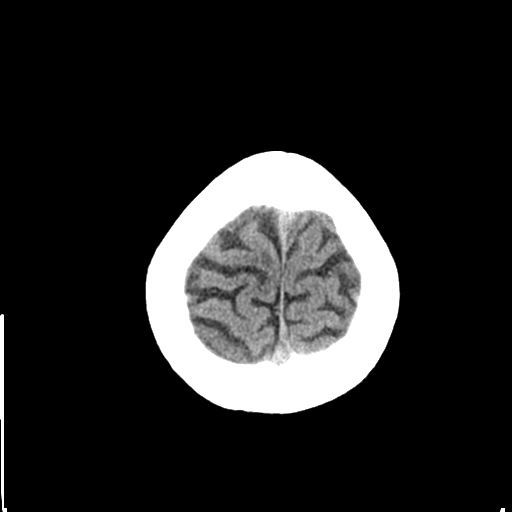
[im 27/29  brain]
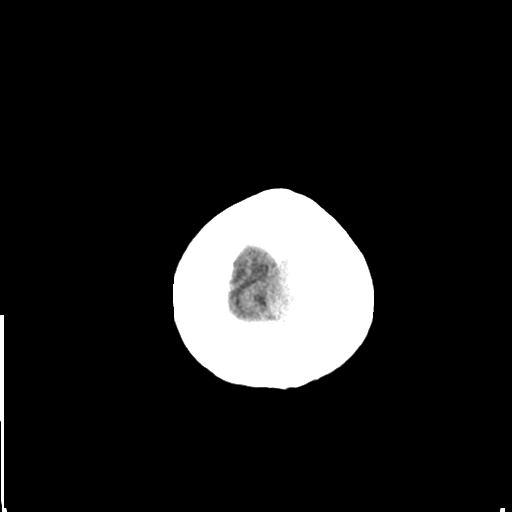
[im 27/29  bone]
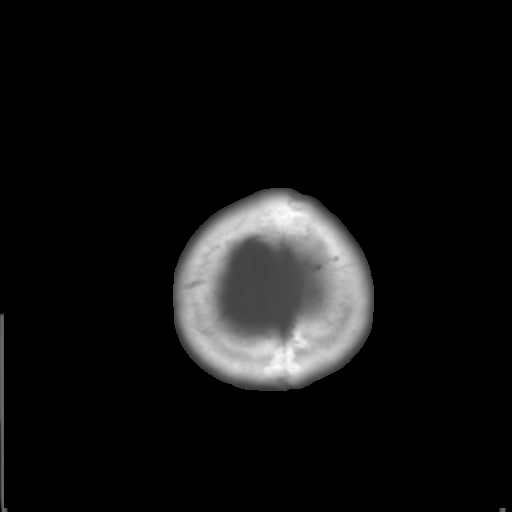

[Series 3: bone windows · axial · 0.45mm/px · z∈[-84,-64]mm · 2 of 29 slices shown]
[im 3/29  bone]
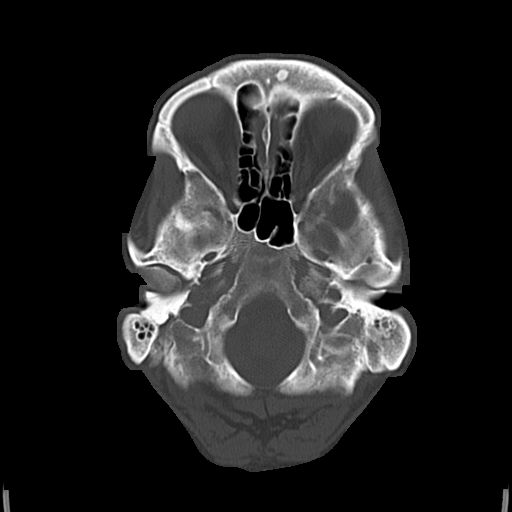
[im 7/29  bone]
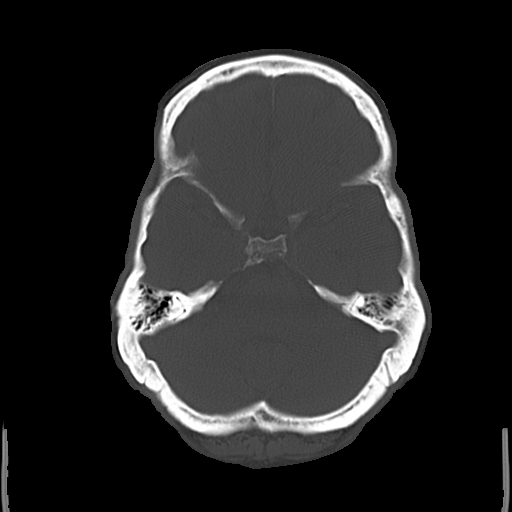

[15 of 30 positions shown; findings below may reference images not displayed]

FINDINGS: Ventricles and sulci are prominent compatible with atrophy.
Bilateral thalamic infarcts. Bilateral basal ganglia lacunar
infarcts. More focal masslike area of low attenuation within the
right parietal lobe measuring 12 mm (image 18; series 2). No
intracranial hemorrhage. Orbits are unremarkable. Visualized
paranasal sinuses are unremarkable. Calvarium is intact.
IMPRESSION: Masslike area of low attenuation within the right parietal lobe is
nonspecific and may be secondary to subacute infarct or chronic
small vessel ischemic change however underlying mass is not
excluded. Recommend correlation with pre and post contrast-enhanced
brain MRI.

Bilateral thalamic lacunar infarcts.

Cortical atrophy and chronic small vessel ischemic changes.

## 2017-03-18 MED FILL — traZODone HCL 50 MG TABS: 50 | 30 days supply | Qty: 45 | Fill #4

## 2017-03-18 MED FILL — MAGNESIUM OXIDE 400 MG TAB: 400 (240 MG | 30 days supply | Qty: 60 | Fill #1

## 2017-03-18 MED FILL — GABAPENTIN 300 MG CAPSULE: 300 | 30 days supply | Qty: 60 | Fill #1

## 2017-03-18 MED FILL — METHOCARBAMOL 750 MG TABS: 750 | 30 days supply | Qty: 60 | Fill #1

## 2017-03-18 MED FILL — LISINOPRIL 5 MG TABLET: 5 | 30 days supply | Qty: 30 | Fill #1

## 2017-03-20 MED FILL — LEVETIRACETAM ER 500 MG TAB: 500 | 30 days supply | Qty: 60 | Fill #1

## 2017-03-20 MED FILL — ?ATORVASTATIN 40MG TAB: 40 | 30 days supply | Qty: 30 | Fill #1

## 2017-03-22 ENCOUNTER — Other Ambulatory Visit: Payer: Self-pay | Admitting: Pharmacist

## 2017-03-22 DIAGNOSIS — J410 Simple chronic bronchitis: Secondary | ICD-10-CM

## 2017-03-22 MED ORDER — ALBUTEROL SULFATE HFA 108 (90 BASE) MCG/ACT IN AERS: 2.0000 | INHALATION_SPRAY | Freq: Four times a day (QID) | RESPIRATORY_TRACT | 0 refills | Status: AC | PRN

## 2017-03-22 MED FILL — $VENTOLIN HFA 18G INHALER: 108 (90 BAS | 25 days supply | Qty: 18 | Fill #0

## 2017-04-15 MED FILL — ?ATORVASTATIN 40MG TAB: 40 | 30 days supply | Qty: 30 | Fill #2

## 2017-04-15 MED FILL — LISINOPRIL 5 MG TAB: 5 | 30 days supply | Qty: 30 | Fill #2

## 2017-04-24 MED FILL — METHOCARBAMOL 750 MG TABS: 750 | 30 days supply | Qty: 60 | Fill #2

## 2017-04-24 MED FILL — LEVETIRACETAM ER 500 MG TAB: 500 | 30 days supply | Qty: 60 | Fill #2

## 2017-04-24 MED FILL — GABAPENTIN 300 MG CAPSULE: 300 | 30 days supply | Qty: 60 | Fill #2

## 2017-05-20 ENCOUNTER — Encounter: Payer: Self-pay | Admitting: Family Medicine

## 2017-05-20 ENCOUNTER — Ambulatory Visit: Payer: Self-pay | Attending: Family Medicine | Admitting: Family Medicine

## 2017-05-20 VITALS — BP 121/86 | HR 97 | Temp 98.1°F | Ht 68.0 in | Wt 147.6 lb

## 2017-05-20 DIAGNOSIS — M545 Low back pain: Secondary | ICD-10-CM | POA: Insufficient documentation

## 2017-05-20 DIAGNOSIS — Z886 Allergy status to analgesic agent status: Secondary | ICD-10-CM | POA: Insufficient documentation

## 2017-05-20 DIAGNOSIS — G8929 Other chronic pain: Secondary | ICD-10-CM | POA: Insufficient documentation

## 2017-05-20 DIAGNOSIS — Z8719 Personal history of other diseases of the digestive system: Secondary | ICD-10-CM | POA: Insufficient documentation

## 2017-05-20 DIAGNOSIS — I10 Essential (primary) hypertension: Secondary | ICD-10-CM | POA: Insufficient documentation

## 2017-05-20 DIAGNOSIS — F329 Major depressive disorder, single episode, unspecified: Secondary | ICD-10-CM | POA: Insufficient documentation

## 2017-05-20 DIAGNOSIS — Z8673 Personal history of transient ischemic attack (TIA), and cerebral infarction without residual deficits: Secondary | ICD-10-CM | POA: Insufficient documentation

## 2017-05-20 DIAGNOSIS — R569 Unspecified convulsions: Secondary | ICD-10-CM | POA: Insufficient documentation

## 2017-05-20 DIAGNOSIS — F419 Anxiety disorder, unspecified: Secondary | ICD-10-CM | POA: Insufficient documentation

## 2017-05-20 DIAGNOSIS — J45909 Unspecified asthma, uncomplicated: Secondary | ICD-10-CM | POA: Insufficient documentation

## 2017-05-20 DIAGNOSIS — Z79899 Other long term (current) drug therapy: Secondary | ICD-10-CM | POA: Insufficient documentation

## 2017-05-20 MED ORDER — GABAPENTIN 300 MG PO CAPS: 300.0000 mg | ORAL_CAPSULE | Freq: Two times a day (BID) | ORAL | 3 refills | Status: DC

## 2017-05-20 MED ORDER — LEVETIRACETAM ER 500 MG PO TB24: 1000.0000 mg | ORAL_TABLET | Freq: Every day | ORAL | 3 refills | Status: DC

## 2017-05-20 MED ORDER — ATORVASTATIN CALCIUM 40 MG PO TABS: 40.0000 mg | ORAL_TABLET | Freq: Every day | ORAL | 3 refills | Status: DC

## 2017-05-20 MED ORDER — LISINOPRIL 5 MG PO TABS: 5.0000 mg | ORAL_TABLET | Freq: Every day | ORAL | 3 refills | Status: DC

## 2017-05-20 MED ORDER — DULOXETINE HCL 60 MG PO CPEP: 60.0000 mg | ORAL_CAPSULE | Freq: Every day | ORAL | 3 refills | Status: DC

## 2017-05-20 MED ORDER — METHOCARBAMOL 750 MG PO TABS: 750.0000 mg | ORAL_TABLET | Freq: Three times a day (TID) | ORAL | 3 refills | Status: DC | PRN

## 2017-05-20 MED FILL — traZODone HCL 50 MG TABS: 50 | 30 days supply | Qty: 45 | Fill #5

## 2017-05-20 MED FILL — ATORVASTATIN 40 MG TABLET: 40 | 30 days supply | Qty: 30 | Fill #3

## 2017-05-20 MED FILL — LISINOPRIL 5 MG TAB: 5 | 30 days supply | Qty: 30 | Fill #3

## 2017-05-20 MED FILL — METHOCARBAMOL 750 MG TABS: 750 | 30 days supply | Qty: 90 | Fill #0

## 2017-05-20 MED FILL — DULoxetine HCL 60 MG CPEP: 60 | 30 days supply | Qty: 30 | Fill #0

## 2017-05-20 MED FILL — LEVETIRACETAM ER 500 MG TAB: 500 | 30 days supply | Qty: 60 | Fill #0

## 2017-05-20 NOTE — Patient Instructions (Signed)

## 2017-05-20 NOTE — Progress Notes (Signed)
Subjective:  Patient ID: Jared Reyes, male    DOB: 1955-12-29  Age: 62 y.o. 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  His Blood pressure is controlled and he tolerates his antihypertensive. He denies recent seizures and has been compliant with Keppra. Today he complains of chronic low back pain for several year which have not improved despite use of Robaxin. Pain is a 2/10 now but goes up to a 10/10 and does not radiate down his extremities and he denies numbness in legs or loss of sphincteric function. It is decreased by leaning forward and increased by prolonged standing.  Past Medical History:  Diagnosis Date  . Anxiety   . Arthritis   . Asthma   . Cellulitis 09/20/2015   right elbow  . Chronic low back pain 10/22/2014  . Depression 01/14/2007   Qualifier: Diagnosis of  By: Sarajane Jews MD, Ishmael Holter   . Esophagitis   . Essential hypertension 10/25/2006   Qualifier: Diagnosis of  By: Sherlynn Stalls, CMA, [place]    . 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 Hernia, inguinal    right  . History of gastric ulcer 12/31/2014  . History of stroke 03/11/2015  . Poor dentition    multiple broken teeth (only few not broken)  . Seizures ([place]) 01/08/2007   alcohol withdrawal, reports last seizure 2016  . Stroke ([place])   . Substance abuse ([place])    alcoholism    Past Surgical History:  Procedure Laterality Date  . ABDOMINAL SURGERY    . EYE SURGERY     cataracts bilateral 2008  . FRACTURE SURGERY  1978   surgery on 2,3,4 digits left hand  . HAND SURGERY     left hand  . HERNIA REPAIR Right    as a child  . INGUINAL HERNIA REPAIR Right 12/12/2016   Procedure: HERNIA REPAIR INGUINAL ADULT recurrent ;  Surgeon: Vickie Epley, MD;  Location: [facility] ORS;  Service: General;  Laterality:  Right;  . TONSILLECTOMY      Allergies  Allergen Reactions  . Nsaids Other (See Comments)    Intolerance, hx of gastritis and gastric ulcer     Outpatient Medications Prior to Visit  Medication Sig Dispense Refill  . albuterol (VENTOLIN HFA) 108 (90 Base) MCG/ACT inhaler Inhale 2 puffs into the lungs every 6 (six) hours as needed for wheezing or shortness of breath. 54 g 0  . docusate sodium (COLACE) 100 MG capsule Take 1 capsule (100 mg total) by mouth daily. (Patient taking differently: Take 100 mg by mouth daily as needed for mild constipation. ) 30 capsule 5  . Glucosamine HCl (GLUCOSAMINE PO) Take 1 tablet by mouth 2 (two) times a week.    . Magnesium Oxide 400 MG CAPS Take 1 capsule (400 mg total) by mouth 2 (two) times daily. 60 capsule 1  . Turmeric 500 MG CAPS Take 500 mg by mouth daily.    Marland Kitchen atorvastatin (LIPITOR) 40 MG tablet Take 1 tablet (40 mg total) by mouth daily. 30 tablet 3  . levETIRAcetam (KEPPRA XR) 500 MG 24 hr tablet Take 2 tablets (1,000 mg total) by mouth daily. 60 tablet 3  . lisinopril (PRINIVIL,ZESTRIL) 5 MG tablet Take 1 tablet (5 mg total) by mouth daily. 30 tablet 3  .  methocarbamol (ROBAXIN-750) 750 MG tablet Take 1 tablet (750 mg total) by mouth 2 (two) times daily as needed for muscle spasms. 60 tablet 3  . traZODone (DESYREL) 50 MG tablet Take 1.5 tablets (75 mg total) by mouth at bedtime. 45 tablet 5  . potassium chloride 20 MEQ TBCR Take 20 mEq by mouth daily. (Patient not taking: Reported on 05/20/2017) 10 tablet 0  . gabapentin (NEURONTIN) 300 MG capsule Take 1 capsule (300 mg total) by mouth 2 (two) times daily. (Patient not taking: Reported on 02/14/2017) 60 capsule 3   No facility-administered medications prior to visit.     ROS Review of Systems  Constitutional: Negative for activity change and appetite change.  HENT: Negative for sinus pressure and sore throat.   Eyes: Negative for visual disturbance.  Respiratory: Negative for cough, chest  tightness and shortness of breath.   Cardiovascular: Negative for chest pain and leg swelling.  Gastrointestinal: Negative for abdominal distention, abdominal pain, constipation and diarrhea.  Endocrine: Negative.   Genitourinary: Negative for dysuria.  Musculoskeletal:       See hpi  Skin: Negative for rash.  Allergic/Immunologic: Negative.   Neurological: Negative for weakness, light-headedness and numbness.  Psychiatric/Behavioral: Negative for dysphoric mood and suicidal ideas.    Objective:  BP 121/86   Pulse 97   Temp 98.1 F (36.7 C) (Oral)   Ht 5' 8"  (1.727 m)   Wt 147 lb 9.6 oz (67 kg)   SpO2 98%   BMI 22.44 kg/m   BP/Weight 05/20/2017 02/19/2017 3/87/5643  Systolic BP 329 518 99  Diastolic BP 86 82 62  Wt. (Lbs) 147.6 150.8 -  BMI 22.44 22.93 -  Some encounter information is confidential and restricted. Go to Review Flowsheets activity to see all data.      Physical Exam  Constitutional: He is oriented to person, place, and time. He appears well-developed and well-nourished.  Cardiovascular: Normal rate, normal heart sounds and intact distal pulses.  No murmur heard. Pulmonary/Chest: Effort normal and breath sounds normal. He has no wheezes. He has no rales. He exhibits no tenderness.  Abdominal: Soft. Bowel sounds are normal. He exhibits no distension and no mass. There is no tenderness.  Musculoskeletal: Normal range of motion. He exhibits no tenderness (no TTP of lumbar spine).  Negative straight leg raise b/l  Neurological: He is alert and oriented to person, place, and time. He displays normal reflexes.  Skin: Skin is warm and dry.  Psychiatric: He has a normal mood and affect.     Assessment & Plan:   1. Seizures (HCC) Stable - levETIRAcetam (KEPPRA XR) 500 MG 24 hr tablet; Take 2 tablets (1,000 mg total) by mouth daily.  Dispense: 60 tablet; Refill: 3  2. Essential hypertension Controlled - CMP14+EGFR - lisinopril (PRINIVIL,ZESTRIL) 5 MG tablet;  Take 1 tablet (5 mg total) by mouth daily.  Dispense: 30 tablet; Refill: 3 - atorvastatin (LIPITOR) 40 MG tablet; Take 1 tablet (40 mg total) by mouth daily.  Dispense: 30 tablet; Refill: 3  3. Chronic bilateral low back pain without sciatica Uncontrolled Increased Robaxin dose, Cymbalta added to regimen Consider MRI if symptoms persist. - DG Lumbar Spine Complete; Future - methocarbamol (ROBAXIN-750) 750 MG tablet; Take 1 tablet (750 mg total) by mouth every 8 (eight) hours as needed for muscle spasms.  Dispense: 90 tablet; Refill: 3 - gabapentin (NEURONTIN) 300 MG capsule; Take 1 capsule (300 mg total) by mouth 2 (two) times daily.  Dispense: 60 capsule; Refill: 3 -  DULoxetine (CYMBALTA) 60 MG capsule; Take 1 capsule (60 mg total) by mouth daily.  Dispense: 30 capsule; Refill: 3   Meds ordered this encounter  Medications  . methocarbamol (ROBAXIN-750) 750 MG tablet    Sig: Take 1 tablet (750 mg total) by mouth every 8 (eight) hours as needed for muscle spasms.    Dispense:  90 tablet    Refill:  3  . lisinopril (PRINIVIL,ZESTRIL) 5 MG tablet    Sig: Take 1 tablet (5 mg total) by mouth daily.    Dispense:  30 tablet    Refill:  3  . levETIRAcetam (KEPPRA XR) 500 MG 24 hr tablet    Sig: Take 2 tablets (1,000 mg total) by mouth daily.    Dispense:  60 tablet    Refill:  3  . atorvastatin (LIPITOR) 40 MG tablet    Sig: Take 1 tablet (40 mg total) by mouth daily.    Dispense:  30 tablet    Refill:  3  . gabapentin (NEURONTIN) 300 MG capsule    Sig: Take 1 capsule (300 mg total) by mouth 2 (two) times daily.    Dispense:  60 capsule    Refill:  3  . DULoxetine (CYMBALTA) 60 MG capsule    Sig: Take 1 capsule (60 mg total) by mouth daily.    Dispense:  30 capsule    Refill:  3    Follow-up: Return in about 3 months (around 08/19/2017) for Follow-up of chronic medical conditions.   Charlott Rakes MD

## 2017-05-21 ENCOUNTER — Telehealth: Payer: Self-pay

## 2017-05-21 LAB — CMP14+EGFR
ALBUMIN: 4.4 g/dL (ref 3.6–4.8)
ALK PHOS: 73 IU/L (ref 39–117)
ALT: 63 IU/L — ABNORMAL HIGH (ref 0–44)
AST: 90 IU/L — AB (ref 0–40)
Albumin/Globulin Ratio: 2.3 — ABNORMAL HIGH (ref 1.2–2.2)
BILIRUBIN TOTAL: 0.3 mg/dL (ref 0.0–1.2)
BUN/Creatinine Ratio: 15 (ref 10–24)
BUN: 9 mg/dL (ref 8–27)
CHLORIDE: 102 mmol/L (ref 96–106)
CO2: 23 mmol/L (ref 20–29)
Calcium: 9.1 mg/dL (ref 8.6–10.2)
Creatinine, Ser: 0.62 mg/dL — ABNORMAL LOW (ref 0.76–1.27)
GFR calc Af Amer: 124 mL/min/{1.73_m2} (ref >59)
GFR calc non Af Amer: 107 mL/min/{1.73_m2} (ref >59)
GLUCOSE: 80 mg/dL (ref 65–99)
Globulin, Total: 1.9 g/dL (ref 1.5–4.5)
Potassium: 3.9 mmol/L (ref 3.5–5.2)
Sodium: 141 mmol/L (ref 134–144)
Total Protein: 6.3 g/dL (ref 6.0–8.5)

## 2017-05-21 NOTE — Telephone Encounter (Signed)
Patient was called and informed of lab results. 

## 2017-06-10 MED FILL — GABAPENTIN 300 MG CAPSULE: 300 | 30 days supply | Qty: 60 | Fill #3

## 2017-06-19 MED FILL — ATORVASTATIN CALCIUM 40 MG: 40 | 30 days supply | Qty: 30 | Fill #0

## 2017-06-19 MED FILL — LISINOPRIL 5 MG TAB: 5 | 30 days supply | Qty: 30 | Fill #0

## 2017-07-05 MED FILL — LEVETIRACETAM ER 500 MG TAB: 500 | 30 days supply | Qty: 60 | Fill #1

## 2017-07-05 MED FILL — DULoxetine HCL 60 MG CPEP: 60 | 30 days supply | Qty: 30 | Fill #1

## 2017-07-15 MED FILL — GABAPENTIN 300 MG CAPSULE: 300 | 30 days supply | Qty: 60 | Fill #0

## 2017-07-15 MED FILL — METHOCARBAMOL 750 MG TABS: 750 | 30 days supply | Qty: 90 | Fill #1

## 2017-07-19 MED FILL — LISINOPRIL 5 MG TAB: 5 | 30 days supply | Qty: 30 | Fill #1

## 2017-07-19 MED FILL — ATORVASTATIN CALCIUM 40 MG: 40 | 30 days supply | Qty: 30 | Fill #1

## 2017-08-13 MED FILL — LEVETIRACETAM ER 500 MG TAB: 500 | 30 days supply | Qty: 60 | Fill #2

## 2017-08-13 MED FILL — GABAPENTIN 300 MG CAPSULE: 300 | 30 days supply | Qty: 60 | Fill #1

## 2017-08-19 ENCOUNTER — Ambulatory Visit: Payer: Self-pay | Attending: Family Medicine | Admitting: Family Medicine

## 2017-08-19 ENCOUNTER — Other Ambulatory Visit: Payer: Self-pay

## 2017-08-19 ENCOUNTER — Encounter: Payer: Self-pay | Admitting: Family Medicine

## 2017-08-19 VITALS — BP 128/92 | HR 118 | Temp 97.8°F | Ht 68.0 in | Wt 144.4 lb

## 2017-08-19 DIAGNOSIS — R945 Abnormal results of liver function studies: Secondary | ICD-10-CM

## 2017-08-19 DIAGNOSIS — I4510 Unspecified right bundle-branch block: Secondary | ICD-10-CM | POA: Insufficient documentation

## 2017-08-19 DIAGNOSIS — R9431 Abnormal electrocardiogram [ECG] [EKG]: Secondary | ICD-10-CM | POA: Insufficient documentation

## 2017-08-19 DIAGNOSIS — F419 Anxiety disorder, unspecified: Secondary | ICD-10-CM | POA: Insufficient documentation

## 2017-08-19 DIAGNOSIS — G8929 Other chronic pain: Secondary | ICD-10-CM | POA: Insufficient documentation

## 2017-08-19 DIAGNOSIS — R000 Tachycardia, unspecified: Secondary | ICD-10-CM | POA: Insufficient documentation

## 2017-08-19 DIAGNOSIS — Z9889 Other specified postprocedural states: Secondary | ICD-10-CM | POA: Insufficient documentation

## 2017-08-19 DIAGNOSIS — Z79899 Other long term (current) drug therapy: Secondary | ICD-10-CM | POA: Insufficient documentation

## 2017-08-19 DIAGNOSIS — R569 Unspecified convulsions: Secondary | ICD-10-CM | POA: Insufficient documentation

## 2017-08-19 DIAGNOSIS — J45909 Unspecified asthma, uncomplicated: Secondary | ICD-10-CM | POA: Insufficient documentation

## 2017-08-19 DIAGNOSIS — Z791 Long term (current) use of non-steroidal anti-inflammatories (NSAID): Secondary | ICD-10-CM | POA: Insufficient documentation

## 2017-08-19 DIAGNOSIS — Z8673 Personal history of transient ischemic attack (TIA), and cerebral infarction without residual deficits: Secondary | ICD-10-CM | POA: Insufficient documentation

## 2017-08-19 DIAGNOSIS — R7989 Other specified abnormal findings of blood chemistry: Secondary | ICD-10-CM

## 2017-08-19 DIAGNOSIS — Z886 Allergy status to analgesic agent status: Secondary | ICD-10-CM | POA: Insufficient documentation

## 2017-08-19 DIAGNOSIS — M545 Low back pain, unspecified: Secondary | ICD-10-CM

## 2017-08-19 DIAGNOSIS — I10 Essential (primary) hypertension: Secondary | ICD-10-CM | POA: Insufficient documentation

## 2017-08-19 DIAGNOSIS — F329 Major depressive disorder, single episode, unspecified: Secondary | ICD-10-CM | POA: Insufficient documentation

## 2017-08-19 DIAGNOSIS — Z720 Tobacco use: Secondary | ICD-10-CM | POA: Insufficient documentation

## 2017-08-19 MED ORDER — LEVETIRACETAM ER 500 MG PO TB24
1000.0000 mg | ORAL_TABLET | Freq: Every day | ORAL | 3 refills | Status: AC
Start: 2017-08-19 — End: ?

## 2017-08-19 MED ORDER — GABAPENTIN 300 MG PO CAPS
300.0000 mg | ORAL_CAPSULE | Freq: Two times a day (BID) | ORAL | 3 refills | Status: AC
Start: 2017-08-19 — End: ?

## 2017-08-19 MED ORDER — LISINOPRIL 2.5 MG PO TABS
2.5000 mg | ORAL_TABLET | Freq: Every day | ORAL | 6 refills | Status: AC
Start: 2017-08-19 — End: ?

## 2017-08-19 MED ORDER — ATORVASTATIN CALCIUM 40 MG PO TABS: 40.0000 mg | ORAL_TABLET | Freq: Every day | ORAL | 3 refills | Status: AC

## 2017-08-19 MED ORDER — DULOXETINE HCL 60 MG PO CPEP
60.0000 mg | ORAL_CAPSULE | Freq: Every day | ORAL | 3 refills | Status: AC
Start: 2017-08-19 — End: ?

## 2017-08-19 MED ORDER — METOPROLOL TARTRATE 25 MG PO TABS: 12.5000 mg | ORAL_TABLET | Freq: Two times a day (BID) | ORAL | 6 refills | Status: AC

## 2017-08-19 MED ORDER — METHOCARBAMOL 750 MG PO TABS: 750.0000 mg | ORAL_TABLET | Freq: Three times a day (TID) | ORAL | 3 refills | Status: AC | PRN

## 2017-08-19 MED ORDER — LISINOPRIL 5 MG PO TABS: 5.0000 mg | ORAL_TABLET | Freq: Every day | ORAL | 3 refills | Status: DC

## 2017-08-19 MED FILL — METHOCARBAMOL 750 MG TABS: 750 | 30 days supply | Qty: 90 | Fill #0

## 2017-08-19 MED FILL — DULoxetine HCL 60 MG CPEP: 60 | 30 days supply | Qty: 30 | Fill #0

## 2017-08-19 MED FILL — METOPROLOL TARTRATE 25 MG T: 25 | 30 days supply | Qty: 30 | Fill #0

## 2017-08-19 MED FILL — ATORVASTATIN CALCIUM 40 MG: 40 | 30 days supply | Qty: 30 | Fill #0

## 2017-08-19 MED FILL — LISINOPRIL 2.5 MG TABLET: 2.5 | 30 days supply | Qty: 30 | Fill #0

## 2017-08-19 NOTE — Patient Instructions (Signed)

## 2017-08-19 NOTE — Progress Notes (Signed)
Subjective:  Patient ID: Jared Reyes, male    DOB: 31-May-1955  Age: 62 y.o. 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 He has chronic low back pain which has been stable but flared up slightly when he sat down in the exam room and he rates his pain at a 4/10 and feels like "a knife stuck in his back".  Tries not to use his pain medications around-the-clock but uses it sparingly and ambulates with the aid of a cane for stability.  Denies radiation of pain down his lower extremities and has no numbness in his legs. On his good days he is able to mow his yard with no pain. His last seizure was in 2016 and he has been compliant with his Keppra and now drives as his driver's license had been reinstated by [employer]. His blood pressure is normal but his heart rate is tachycardic and he informs me he has always had a fast heart rate.  Past Medical History:  Diagnosis Date  . Anxiety   . Arthritis   . Asthma   . Cellulitis 09/20/2015   right elbow  . Chronic low back pain 10/22/2014  . Depression 01/14/2007   Qualifier: Diagnosis of  By: Sarajane Jews MD, Ishmael Holter   . Esophagitis   . Essential hypertension 10/25/2006   Qualifier: Diagnosis of  By: Sherlynn Stalls, CMA, [place]    . 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 Hernia, inguinal    right  . History of gastric ulcer 12/31/2014  . History of stroke 03/11/2015  . Poor dentition    multiple broken teeth (only few not broken)  . Seizures ([place]) 01/08/2007   alcohol withdrawal, reports last seizure 2016  . Stroke ([place])   . Substance abuse ([place])    alcoholism    Past Surgical History:  Procedure Laterality Date  . ABDOMINAL SURGERY    . EYE SURGERY     cataracts bilateral 2008  . FRACTURE SURGERY  1978   surgery on 2,3,4 digits left hand  .  HAND SURGERY     left hand  . HERNIA REPAIR Right    as a child  . INGUINAL HERNIA REPAIR Right 12/12/2016   Procedure: HERNIA REPAIR INGUINAL ADULT recurrent ;  Surgeon: Vickie Epley, MD;  Location: [facility] ORS;  Service: General;  Laterality: Right;  . TONSILLECTOMY      Allergies  Allergen Reactions  . Nsaids Other (See Comments)    Intolerance, hx of gastritis and gastric ulcer     Outpatient Medications Prior to Visit  Medication Sig Dispense Refill  . albuterol (VENTOLIN HFA) 108 (90 Base) MCG/ACT inhaler Inhale 2 puffs into the lungs every 6 (six) hours as needed for wheezing or shortness of breath. 54 g 0  . Turmeric 500 MG CAPS Take 500 mg by mouth daily.    Marland Kitchen atorvastatin (LIPITOR) 40 MG tablet Take 1 tablet (40 mg total) by mouth daily. 30 tablet 3  . DULoxetine (CYMBALTA) 60 MG capsule Take 1 capsule (60 mg total) by mouth daily. 30 capsule 3  . gabapentin (NEURONTIN) 300 MG capsule Take 1 capsule (300 mg total) by mouth 2 (two) times daily. 60 capsule 3  . levETIRAcetam (KEPPRA XR) 500 MG 24 hr tablet Take 2 tablets (1,000 mg total)  by mouth daily. 60 tablet 3  . lisinopril (PRINIVIL,ZESTRIL) 5 MG tablet Take 1 tablet (5 mg total) by mouth daily. 30 tablet 3  . methocarbamol (ROBAXIN-750) 750 MG tablet Take 1 tablet (750 mg total) by mouth every 8 (eight) hours as needed for muscle spasms. 90 tablet 3  . docusate sodium (COLACE) 100 MG capsule Take 1 capsule (100 mg total) by mouth daily. (Patient not taking: Reported on 08/19/2017) 30 capsule 5  . Glucosamine HCl (GLUCOSAMINE PO) Take 1 tablet by mouth 2 (two) times a week.    . Magnesium Oxide 400 MG CAPS Take 1 capsule (400 mg total) by mouth 2 (two) times daily. (Patient not taking: Reported on 08/19/2017) 60 capsule 1  . potassium chloride 20 MEQ TBCR Take 20 mEq by mouth daily. (Patient not taking: Reported on 05/20/2017) 10 tablet 0   No facility-administered medications prior to visit.     ROS Review of Systems    Constitutional: Negative for activity change and appetite change.  HENT: Negative for sinus pressure and sore throat.   Eyes: Negative for visual disturbance.  Respiratory: Negative for cough, chest tightness and shortness of breath.   Cardiovascular: Negative for chest pain and leg swelling.  Gastrointestinal: Negative for abdominal distention, abdominal pain, constipation and diarrhea.  Endocrine: Negative.   Genitourinary: Negative for dysuria.  Musculoskeletal: Positive for back pain. Negative for joint swelling and myalgias.  Skin: Negative for rash.  Allergic/Immunologic: Negative.   Neurological: Negative for weakness, light-headedness and numbness.  Psychiatric/Behavioral: Negative for dysphoric mood and suicidal ideas.    Objective:  BP (!) 128/92   Pulse (!) 118   Temp 97.8 F (36.6 C) (Oral)   Ht 5' 8"  (1.727 m)   Wt 144 lb 6.4 oz (65.5 kg)   SpO2 (!) 80%   BMI 21.96 kg/m   BP/Weight 08/19/2017 05/20/2017 8/50/2774  Systolic BP 128 786 767  Diastolic BP 92 86 82  Wt. (Lbs) 144.4 147.6 150.8  BMI 21.96 22.44 22.93  Some encounter information is confidential and restricted. Go to Review Flowsheets activity to see all data.     Physical Exam  Constitutional: He is oriented to person, place, and time. He appears well-developed and well-nourished.  Cardiovascular: Normal heart sounds and intact distal pulses. Tachycardia present.  No murmur heard. Pulmonary/Chest: Effort normal and breath sounds normal. He has no wheezes. He has no rales. He exhibits no tenderness.  Abdominal: Soft. Bowel sounds are normal. He exhibits no distension and no mass. There is no tenderness.  Musculoskeletal: Normal range of motion. He exhibits tenderness (slight TTP of lumbar spine; negative straight leg raise b/l).  Neurological: He is alert and oriented to person, place, and time.  Skin: Skin is warm and dry.  Psychiatric: He has a normal mood and affect.     Assessment & Plan:    1. Seizures (HCC) Controlled - levETIRAcetam (KEPPRA XR) 500 MG 24 hr tablet; Take 2 tablets (1,000 mg total) by mouth daily.  Dispense: 60 tablet; Refill: 3  2. Chronic bilateral low back pain without sciatica Stable Apply heat - methocarbamol (ROBAXIN-750) 750 MG tablet; Take 1 tablet (750 mg total) by mouth every 8 (eight) hours as needed for muscle spasms.  Dispense: 90 tablet; Refill: 3 - gabapentin (NEURONTIN) 300 MG capsule; Take 1 capsule (300 mg total) by mouth 2 (two) times daily.  Dispense: 60 capsule; Refill: 3 - DULoxetine (CYMBALTA) 60 MG capsule; Take 1 capsule (60 mg total) by mouth daily.  Dispense: 30 capsule; Refill: 3  3. Essential hypertension Controlled Metoprolol added to regimen due to tachycardia and dose of lisinopril decreased He does have history of hypokalemia but has not been taking his potassium pills We will check labs and reinstate if indicated He has a slightly decreased oxygen saturation but does not appear to be dyspneic on exam. - CMP14+EGFR - atorvastatin (LIPITOR) 40 MG tablet; Take 1 tablet (40 mg total) by mouth daily.  Dispense: 30 tablet; Refill: 3 - lisinopril (PRINIVIL,ZESTRIL) 2.5 MG tablet; Take 1 tablet (2.5 mg total) by mouth daily.  Dispense: 30 tablet; Refill: 6  4. Elevated LFTs Slightly elevated LFTs which has trended down significantly from 01/2017 We will check LFTs  5. Abnormal EKG Tachycardia with evidence of septal infarct, incomplete RBBB similar to EKG from 02/14/2017 but new compared to EKG from 10/2016 He may need a stress test - Ambulatory referral to Cardiology   Meds ordered this encounter  Medications  . levETIRAcetam (KEPPRA XR) 500 MG 24 hr tablet    Sig: Take 2 tablets (1,000 mg total) by mouth daily.    Dispense:  60 tablet    Refill:  3  . methocarbamol (ROBAXIN-750) 750 MG tablet    Sig: Take 1 tablet (750 mg total) by mouth every 8 (eight) hours as needed for muscle spasms.    Dispense:  90 tablet     Refill:  3  . DISCONTD: lisinopril (PRINIVIL,ZESTRIL) 5 MG tablet    Sig: Take 1 tablet (5 mg total) by mouth daily.    Dispense:  30 tablet    Refill:  3  . gabapentin (NEURONTIN) 300 MG capsule    Sig: Take 1 capsule (300 mg total) by mouth 2 (two) times daily.    Dispense:  60 capsule    Refill:  3  . atorvastatin (LIPITOR) 40 MG tablet    Sig: Take 1 tablet (40 mg total) by mouth daily.    Dispense:  30 tablet    Refill:  3  . DULoxetine (CYMBALTA) 60 MG capsule    Sig: Take 1 capsule (60 mg total) by mouth daily.    Dispense:  30 capsule    Refill:  3  . metoprolol tartrate (LOPRESSOR) 25 MG tablet    Sig: Take 0.5 tablets (12.5 mg total) by mouth 2 (two) times daily.    Dispense:  60 tablet    Refill:  6  . lisinopril (PRINIVIL,ZESTRIL) 2.5 MG tablet    Sig: Take 1 tablet (2.5 mg total) by mouth daily.    Dispense:  30 tablet    Refill:  6    Discontinue 63m    Follow-up: Return in about 3 months (around 11/19/2017) for Follow-up of chronic medical conditions.   ECharlott RakesMD

## 2017-08-20 LAB — CMP14+EGFR
A/G RATIO: 2.5 — AB (ref 1.2–2.2)
ALBUMIN: 4.8 g/dL (ref 3.6–4.8)
ALT: 62 IU/L — ABNORMAL HIGH (ref 0–44)
AST: 198 IU/L — ABNORMAL HIGH (ref 0–40)
Alkaline Phosphatase: 119 IU/L — ABNORMAL HIGH (ref 39–117)
BILIRUBIN TOTAL: 0.5 mg/dL (ref 0.0–1.2)
BUN / CREAT RATIO: 13 (ref 10–24)
BUN: 9 mg/dL (ref 8–27)
CHLORIDE: 105 mmol/L (ref 96–106)
CO2: 22 mmol/L (ref 20–29)
Calcium: 9.3 mg/dL (ref 8.6–10.2)
Creatinine, Ser: 0.71 mg/dL — ABNORMAL LOW (ref 0.76–1.27)
GFR calc Af Amer: 116 mL/min/{1.73_m2} (ref >59)
GFR calc non Af Amer: 101 mL/min/{1.73_m2} (ref >59)
GLUCOSE: 85 mg/dL (ref 65–99)
Globulin, Total: 1.9 g/dL (ref 1.5–4.5)
POTASSIUM: 5 mmol/L (ref 3.5–5.2)
Sodium: 146 mmol/L — ABNORMAL HIGH (ref 134–144)
Total Protein: 6.7 g/dL (ref 6.0–8.5)

## 2017-08-21 ENCOUNTER — Telehealth: Payer: Self-pay

## 2017-08-21 NOTE — Telephone Encounter (Signed)
Patient was called and informed of lab results and to stop medications.

## 2017-09-11 DIAGNOSIS — I10 Essential (primary) hypertension: Secondary | ICD-10-CM

## 2017-09-17 MED FILL — LEVETIRACETAM ER 500 MG TAB: 500 | 30 days supply | Qty: 60 | Fill #3

## 2017-09-17 MED FILL — GABAPENTIN 300 MG CAPSULE: 300 | 30 days supply | Qty: 60 | Fill #2

## 2017-09-17 MED FILL — $VENTOLIN HFA 18G INHALER: 108 (90 BAS | 25 days supply | Qty: 18 | Fill #1

## 2017-09-19 ENCOUNTER — Ambulatory Visit: Payer: Self-pay | Attending: Family Medicine

## 2017-09-19 DIAGNOSIS — R7989 Other specified abnormal findings of blood chemistry: Secondary | ICD-10-CM | POA: Insufficient documentation

## 2017-09-19 DIAGNOSIS — R945 Abnormal results of liver function studies: Secondary | ICD-10-CM

## 2017-09-19 DIAGNOSIS — I10 Essential (primary) hypertension: Secondary | ICD-10-CM | POA: Insufficient documentation

## 2017-09-20 ENCOUNTER — Other Ambulatory Visit: Payer: Self-pay | Admitting: Family Medicine

## 2017-09-20 DIAGNOSIS — R7989 Other specified abnormal findings of blood chemistry: Secondary | ICD-10-CM

## 2017-09-20 DIAGNOSIS — R945 Abnormal results of liver function studies: Principal | ICD-10-CM

## 2017-09-20 LAB — CMP14+EGFR
A/G RATIO: 2.1 (ref 1.2–2.2)
ALT: 46 IU/L — ABNORMAL HIGH (ref 0–44)
AST: 188 IU/L — ABNORMAL HIGH (ref 0–40)
Albumin: 4.8 g/dL (ref 3.6–4.8)
Alkaline Phosphatase: 111 IU/L (ref 39–117)
BILIRUBIN TOTAL: 1.1 mg/dL (ref 0.0–1.2)
BUN/Creatinine Ratio: 7 — ABNORMAL LOW (ref 10–24)
BUN: 5 mg/dL — AB (ref 8–27)
CHLORIDE: 96 mmol/L (ref 96–106)
CO2: 22 mmol/L (ref 20–29)
Calcium: 9.3 mg/dL (ref 8.6–10.2)
Creatinine, Ser: 0.7 mg/dL — ABNORMAL LOW (ref 0.76–1.27)
GFR calc Af Amer: 117 mL/min/{1.73_m2} (ref >59)
GFR calc non Af Amer: 101 mL/min/{1.73_m2} (ref >59)
GLUCOSE: 80 mg/dL (ref 65–99)
Globulin, Total: 2.3 g/dL (ref 1.5–4.5)
POTASSIUM: 4 mmol/L (ref 3.5–5.2)
Sodium: 141 mmol/L (ref 134–144)
Total Protein: 7.1 g/dL (ref 6.0–8.5)

## 2017-09-20 NOTE — Progress Notes (Unsigned)
Please schedule right upper quadrant ultrasound for this patient and inform him.  Thank you

## 2017-09-20 NOTE — Progress Notes (Signed)
Patient here for lab visit only 

## 2017-09-23 ENCOUNTER — Telehealth: Payer: Self-pay

## 2017-09-23 NOTE — Telephone Encounter (Signed)
-----   Message from Hoy RegisterEnobong Newlin, MD sent at 09/20/2017  1:35 PM EDT ----- Liver enzymes are still elevated.  I have sent off additional tests to evaluate this further and he will be scheduled for a right upper quadrant ultrasound.

## 2017-09-23 NOTE — Progress Notes (Signed)
Done 10/02/17

## 2017-09-23 NOTE — Telephone Encounter (Signed)
Patient was called and there is no voicemail set up to leave a message.  Patient has Ultrasound scheduled on 10/02/17 at 10:30

## 2017-09-23 NOTE — Telephone Encounter (Signed)
Patient returned called, gave him lab results and was made aware of ultrasound appointment but stated that he would not be able to attend. Phone number on file is the best to reach him at.

## 2017-09-25 LAB — HEPATITIS PANEL, ACUTE
HEP B C IGM: NEGATIVE
Hep A IgM: NEGATIVE
Hepatitis B Surface Ag: NEGATIVE

## 2017-09-25 LAB — AFP TUMOR MARKER

## 2017-09-25 LAB — SPECIMEN STATUS REPORT

## 2017-09-25 LAB — GAMMA GT: GGT: 1129 IU/L (ref 0–65)

## 2017-10-02 ENCOUNTER — Ambulatory Visit (HOSPITAL_COMMUNITY): Payer: Self-pay

## 2017-10-21 MED FILL — LEVETIRACETAM ER 500 MG TAB: 500 | 30 days supply | Qty: 60 | Fill #0

## 2017-10-21 MED FILL — ATORVASTATIN CALCIUM 40 MG: 40 | 30 days supply | Qty: 30 | Fill #1

## 2017-10-21 MED FILL — DULoxetine HCL 60 MG CPEP: 60 | 30 days supply | Qty: 30 | Fill #1

## 2017-10-21 MED FILL — LISINOPRIL 2.5 MG TABLET: 2.5 | 30 days supply | Qty: 30 | Fill #1

## 2017-11-11 ENCOUNTER — Telehealth: Payer: Self-pay | Admitting: Family Medicine

## 2017-11-11 NOTE — Telephone Encounter (Signed)
[employer] dropped of death certificate

## 2017-11-11 NOTE — Telephone Encounter (Signed)
Jared Reyes was called and informed that death Certificate is ready for pick up. Jared Reyes asked that a cope be faxed over to start process and someone will pick up the official copy today.

## 2017-11-19 ENCOUNTER — Ambulatory Visit: Payer: Self-pay | Admitting: Family Medicine

## 2017-11-29 DEATH — deceased
